# Patient Record
Sex: Female | Born: 1945 | Race: White | Hispanic: No | Smoking: Never smoker
Health system: Southern US, Community
[De-identification: ages and names within clinical notes are randomized; demographics above are authoritative.]

## PROBLEM LIST (undated history)

## (undated) DIAGNOSIS — T7840XA Allergy, unspecified, initial encounter: Secondary | ICD-10-CM

## (undated) DIAGNOSIS — R51 Headache: Secondary | ICD-10-CM

## (undated) DIAGNOSIS — N3281 Overactive bladder: Secondary | ICD-10-CM

## (undated) DIAGNOSIS — M1990 Unspecified osteoarthritis, unspecified site: Secondary | ICD-10-CM

## (undated) DIAGNOSIS — F329 Major depressive disorder, single episode, unspecified: Secondary | ICD-10-CM

## (undated) DIAGNOSIS — H269 Unspecified cataract: Secondary | ICD-10-CM

## (undated) DIAGNOSIS — R519 Headache, unspecified: Secondary | ICD-10-CM

## (undated) DIAGNOSIS — G919 Hydrocephalus, unspecified: Secondary | ICD-10-CM

## (undated) DIAGNOSIS — I10 Essential (primary) hypertension: Secondary | ICD-10-CM

## (undated) DIAGNOSIS — F32A Depression, unspecified: Secondary | ICD-10-CM

## (undated) DIAGNOSIS — K219 Gastro-esophageal reflux disease without esophagitis: Secondary | ICD-10-CM

## (undated) DIAGNOSIS — F419 Anxiety disorder, unspecified: Secondary | ICD-10-CM

## (undated) DIAGNOSIS — E785 Hyperlipidemia, unspecified: Secondary | ICD-10-CM

## (undated) DIAGNOSIS — G319 Degenerative disease of nervous system, unspecified: Secondary | ICD-10-CM

## (undated) DIAGNOSIS — M810 Age-related osteoporosis without current pathological fracture: Secondary | ICD-10-CM

## (undated) DIAGNOSIS — B029 Zoster without complications: Secondary | ICD-10-CM

## (undated) HISTORY — DX: Depression, unspecified: F32.A

## (undated) HISTORY — DX: Zoster without complications: B02.9

## (undated) HISTORY — DX: Anxiety disorder, unspecified: F41.9

## (undated) HISTORY — DX: Gastro-esophageal reflux disease without esophagitis: K21.9

## (undated) HISTORY — DX: Major depressive disorder, single episode, unspecified: F32.9

## (undated) HISTORY — DX: Hyperlipidemia, unspecified: E78.5

## (undated) HISTORY — PX: CHOLECYSTECTOMY: SHX55

## (undated) HISTORY — DX: Essential (primary) hypertension: I10

## (undated) HISTORY — DX: Allergy, unspecified, initial encounter: T78.40XA

## (undated) HISTORY — PX: ABDOMINAL HYSTERECTOMY: SHX81

## (undated) HISTORY — PX: APPENDECTOMY: SHX54

## (undated) HISTORY — DX: Unspecified cataract: H26.9

## (undated) HISTORY — PX: BIOPSY BREAST: PRO8

## (undated) HISTORY — DX: Age-related osteoporosis without current pathological fracture: M81.0

## (undated) HISTORY — PX: NASAL SINUS SURGERY: SHX719

---

## 1999-05-16 ENCOUNTER — Other Ambulatory Visit: Admission: RE | Admit: 1999-05-16 | Discharge: 1999-05-16 | Payer: Self-pay | Admitting: Family Medicine

## 2000-08-26 ENCOUNTER — Ambulatory Visit (HOSPITAL_COMMUNITY): Admission: RE | Admit: 2000-08-26 | Discharge: 2000-08-26 | Payer: Self-pay | Admitting: Internal Medicine

## 2000-08-26 ENCOUNTER — Encounter: Payer: Self-pay | Admitting: Internal Medicine

## 2000-11-09 ENCOUNTER — Emergency Department (HOSPITAL_COMMUNITY): Admission: EM | Admit: 2000-11-09 | Discharge: 2000-11-09 | Payer: Self-pay | Admitting: *Deleted

## 2001-03-20 ENCOUNTER — Encounter: Payer: Self-pay | Admitting: Family Medicine

## 2001-03-20 ENCOUNTER — Ambulatory Visit (HOSPITAL_COMMUNITY): Admission: RE | Admit: 2001-03-20 | Discharge: 2001-03-20 | Payer: Self-pay | Admitting: Family Medicine

## 2001-08-12 ENCOUNTER — Other Ambulatory Visit: Admission: RE | Admit: 2001-08-12 | Discharge: 2001-08-12 | Payer: Self-pay | Admitting: Family Medicine

## 2001-10-30 ENCOUNTER — Ambulatory Visit (HOSPITAL_COMMUNITY): Admission: RE | Admit: 2001-10-30 | Discharge: 2001-10-30 | Payer: Self-pay | Admitting: Internal Medicine

## 2002-01-01 LAB — HM COLONOSCOPY

## 2002-10-27 ENCOUNTER — Other Ambulatory Visit: Admission: RE | Admit: 2002-10-27 | Discharge: 2002-10-27 | Payer: Self-pay | Admitting: Family Medicine

## 2004-12-22 ENCOUNTER — Other Ambulatory Visit: Admission: RE | Admit: 2004-12-22 | Discharge: 2004-12-22 | Payer: Self-pay | Admitting: Family Medicine

## 2010-11-02 LAB — HM DEXA SCAN

## 2012-01-11 ENCOUNTER — Encounter: Payer: Self-pay | Admitting: *Deleted

## 2012-01-11 ENCOUNTER — Encounter: Payer: Self-pay | Admitting: Family Medicine

## 2012-03-18 ENCOUNTER — Telehealth: Payer: Self-pay | Admitting: Nurse Practitioner

## 2012-03-18 NOTE — Telephone Encounter (Signed)
Returning a call about her lab work. Please call

## 2012-04-21 ENCOUNTER — Encounter: Payer: Self-pay | Admitting: *Deleted

## 2012-05-12 NOTE — Telephone Encounter (Addendum)
Labs were on her paper chart this was day we went live. Pt notified of lab results on 03/18/12 per Emmit Pomfret

## 2012-06-04 ENCOUNTER — Other Ambulatory Visit: Payer: Self-pay | Admitting: Family Medicine

## 2012-06-04 ENCOUNTER — Ambulatory Visit: Payer: Self-pay

## 2012-06-04 DIAGNOSIS — M810 Age-related osteoporosis without current pathological fracture: Secondary | ICD-10-CM

## 2012-06-13 ENCOUNTER — Ambulatory Visit (INDEPENDENT_AMBULATORY_CARE_PROVIDER_SITE_OTHER): Payer: Medicare Other | Admitting: Nurse Practitioner

## 2012-06-13 ENCOUNTER — Encounter: Payer: Self-pay | Admitting: Nurse Practitioner

## 2012-06-13 VITALS — BP 134/77 | HR 80 | Temp 98.5°F | Ht 62.0 in | Wt 232.0 lb

## 2012-06-13 DIAGNOSIS — K219 Gastro-esophageal reflux disease without esophagitis: Secondary | ICD-10-CM | POA: Insufficient documentation

## 2012-06-13 DIAGNOSIS — M810 Age-related osteoporosis without current pathological fracture: Secondary | ICD-10-CM | POA: Insufficient documentation

## 2012-06-13 DIAGNOSIS — F32A Depression, unspecified: Secondary | ICD-10-CM | POA: Insufficient documentation

## 2012-06-13 DIAGNOSIS — F329 Major depressive disorder, single episode, unspecified: Secondary | ICD-10-CM

## 2012-06-13 DIAGNOSIS — E785 Hyperlipidemia, unspecified: Secondary | ICD-10-CM | POA: Insufficient documentation

## 2012-06-13 MED ORDER — SERTRALINE HCL 50 MG PO TABS: 50.0000 mg | ORAL_TABLET | Freq: Every day | ORAL | Status: DC

## 2012-06-13 MED ORDER — ATORVASTATIN CALCIUM 10 MG PO TABS: 10.0000 mg | ORAL_TABLET | Freq: Every day | ORAL | Status: DC

## 2012-06-13 MED ORDER — OMEPRAZOLE 40 MG PO CPDR: 40.0000 mg | DELAYED_RELEASE_CAPSULE | Freq: Every day | ORAL | Status: DC

## 2012-06-13 NOTE — Progress Notes (Signed)
  Subjective:    Patient ID: Tracie Wiggins, female    DOB: [birth year], 67 y.o.   MRN: [id number]  Hyperlipidemia This is a chronic problem. The current episode started more than 1 year ago. The problem is controlled. Recent lipid tests were reviewed and are normal. Exacerbating diseases include obesity. There are no known factors aggravating her hyperlipidemia. Pertinent negatives include no focal weakness, myalgias or shortness of breath. Current antihyperlipidemic treatment includes statins. Compliance problems include adherence to diet and adherence to exercise.  Risk factors for coronary artery disease include obesity and post-menopausal.  GERD Omeprazole daily keeps symptoms  sunder control. Patient likes nexium better Depression Working well- helps to keep her relaxed. Osteoporosis prolia- no c/o side effects   Review of Systems  Respiratory: Negative for shortness of breath.   Musculoskeletal: Negative for myalgias.  Neurological: Negative for focal weakness.  All other systems reviewed and are negative.       Objective:   Physical Exam  Constitutional: She is oriented to person, place, and time. She appears well-developed and well-nourished.  HENT:  Nose: Nose normal.  Mouth/Throat: Oropharynx is clear and moist.  Eyes: EOM are normal.  Neck: Trachea normal, normal range of motion and full passive range of motion without pain. Neck supple. No JVD present. Carotid bruit is not present. No thyromegaly present.  Cardiovascular: Normal rate, regular rhythm, normal heart sounds and intact distal pulses.  Exam reveals no gallop and no friction rub.   No murmur heard. Pulmonary/Chest: Effort normal and breath sounds normal.  Abdominal: Soft. Bowel sounds are normal. She exhibits no distension and no mass. There is no tenderness.  Musculoskeletal: Normal range of motion.  Lymphadenopathy:    She has no cervical adenopathy.  Neurological: She is alert and oriented to person,  place, and time. She has normal reflexes.  Skin: Skin is warm and dry.  Psychiatric: She has a normal mood and affect. Her behavior is normal. Judgment and thought content normal.   BP 134/77  Pulse 80  Temp(Src) 98.5 F (36.9 C) (Oral)  Ht 5\' 2"  (1.575 m)  Wt 232 lb (105.235 kg)  BMI 42.42 kg/m2        Assessment & Plan:   1. Hyperlipidemia   2. GERD (gastroesophageal reflux disease)   3. Osteoporosis, unspecified   4. Depression    Orders Placed This Encounter  Procedures  . COMPLETE METABOLIC PANEL WITH GFR  . NMR Lipoprofile with Lipids   Meds ordered this encounter  Medications  . DISCONTD: omeprazole (PRILOSEC) 40 MG capsule    Sig:   . sertraline (ZOLOFT) 50 MG tablet    Sig: Take 1 tablet (50 mg total) by mouth daily.    Dispense:  30 tablet    Refill:  5  . atorvastatin (LIPITOR) 10 MG tablet    Sig: Take 1 tablet (10 mg total) by mouth daily.    Dispense:  30 tablet    Refill:  5  . omeprazole (PRILOSEC) 40 MG capsule    Sig: Take 1 capsule (40 mg total) by mouth daily.    Dispense:  30 capsule    Refill:  5    Order Specific Question:  Supervising Provider    Answer:  Ernestina Penna [[id number]]   Continue all meds labs pending Diet and exercise encouraged F/U in 3 months  Mary-Margaret Daphine Deutscher, FNP

## 2012-06-13 NOTE — Patient Instructions (Signed)

## 2012-06-14 LAB — COMPLETE METABOLIC PANEL WITH GFR
ALT: 21 U/L (ref 0–35)
AST: 21 U/L (ref 0–37)
Albumin: 4.1 g/dL (ref 3.5–5.2)
Alkaline Phosphatase: 92 U/L (ref 39–117)
BUN: 11 mg/dL (ref 6–23)
CO2: 29 mEq/L (ref 19–32)
Calcium: 9.6 mg/dL (ref 8.4–10.5)
Chloride: 103 mEq/L (ref 96–112)
Creat: 0.76 mg/dL (ref 0.50–1.10)
GFR, Est African American: >89 mL/min
GFR, Est Non African American: 81 mL/min
Glucose, Bld: 81 mg/dL (ref 70–99)
Potassium: 4.7 mEq/L (ref 3.5–5.3)
Sodium: 144 mEq/L (ref 135–145)
Total Bilirubin: 0.4 mg/dL (ref 0.3–1.2)
Total Protein: 6.6 g/dL (ref 6.0–8.3)

## 2012-06-16 LAB — NMR LIPOPROFILE WITH LIPIDS
Cholesterol, Total: 160 mg/dL (ref <200)
HDL Particle Number: 39.9 umol/L (ref >=30.5)
HDL Size: 9.3 nm (ref >=9.2)
HDL-C: 62 mg/dL (ref >=40)
LDL (calc): 69 mg/dL (ref <100)
LDL Particle Number: 1154 nmol/L — ABNORMAL HIGH (ref <1000)
LDL Size: 20.7 nm (ref >20.5)
LP-IR Score: 41 (ref <=45)
Large HDL-P: 10.5 umol/L (ref >=4.8)
Large VLDL-P: 3.6 nmol/L — ABNORMAL HIGH (ref <=2.7)
Small LDL Particle Number: 553 nmol/L — ABNORMAL HIGH (ref <=527)
Triglycerides: 147 mg/dL (ref <150)
VLDL Size: 46.4 nm (ref <=46.6)

## 2012-06-27 ENCOUNTER — Other Ambulatory Visit: Payer: Self-pay | Admitting: Nurse Practitioner

## 2012-06-27 ENCOUNTER — Other Ambulatory Visit: Payer: Self-pay | Admitting: Family Medicine

## 2012-07-24 ENCOUNTER — Telehealth: Payer: Self-pay | Admitting: Pharmacist

## 2012-07-28 ENCOUNTER — Telehealth: Payer: Self-pay | Admitting: Nurse Practitioner

## 2012-07-28 NOTE — Telephone Encounter (Signed)
appt made for next prolia injection for 08/28/12 Pt aware

## 2012-07-30 NOTE — Telephone Encounter (Signed)
Patient aware of appt 08/28/12

## 2012-07-30 NOTE — Telephone Encounter (Signed)
Prolia ordered I will let you know when it comes in. 

## 2012-07-31 NOTE — Telephone Encounter (Signed)
Prolia in lab refrigerator 

## 2012-08-28 ENCOUNTER — Ambulatory Visit (INDEPENDENT_AMBULATORY_CARE_PROVIDER_SITE_OTHER): Payer: Medicare Other | Admitting: Pharmacist

## 2012-08-28 DIAGNOSIS — E559 Vitamin D deficiency, unspecified: Secondary | ICD-10-CM

## 2012-08-28 DIAGNOSIS — M810 Age-related osteoporosis without current pathological fracture: Secondary | ICD-10-CM

## 2012-08-28 MED ORDER — DENOSUMAB 60 MG/ML ~~LOC~~ SOLN
60.0000 mg | Freq: Once | SUBCUTANEOUS | Status: AC
  Administered 2012-08-28: 60 mg via SUBCUTANEOUS

## 2012-08-28 NOTE — Progress Notes (Signed)
[facility]   HPI: Patient is osteoporosis due Prolia Injection. Last was 02/2012  Prior fracture?  No Med(s) for Osteoporosis/Osteopenia:  Prolia 60mg  SQ q6 months Med(s) previously tried for Osteoporosis/Osteopenia:  actonel - weakness and evista - stopped because HRT started                                                             PMH:  Hysterectomy?  Yes Oophorectomy?  No Last Vitamin D Result:  28 (03/2012) Last GFR Result:  81 (06/2012)   FH/SH: Family history of osteoporosis?  No Parent with history of hip fracture?  No Family history of breast cancer?  No Exercise?  No Smoking?  No Alcohol?  No      DEXA Results Date of Test T-Score for AP Spine L1-L4 T-Score for Total Left Hip T-Score for Total Right Hip  11/15/2010 -1.4 -2.5 -2.0  09/16/2006 -1.6 -2.3 -2.3  11/23/2002 -1.5 -1.5 --  08/06/2000 -1.8 -1.6 --   Assessment: osteoporosis  Recommendations: 1.  Contineu prolia 60mg  SQ q 6 months - injection given in office today 2.  recommend calcium 1200mg  daily through supplementation or diet.  3.  recommend weight bearing exercise - 30 minutes at least 4 days  per week.   4.  Counseled and educated about fall risk and prevention.  Recheck DEXA:  due 11/2012 - order sent for Dexa  Time spent counseling patient:  20 minutes  Henrene Pastor, PharmD, CPP

## 2012-09-15 ENCOUNTER — Encounter: Payer: Self-pay | Admitting: Nurse Practitioner

## 2012-09-15 ENCOUNTER — Ambulatory Visit (INDEPENDENT_AMBULATORY_CARE_PROVIDER_SITE_OTHER): Payer: Medicare Other | Admitting: Nurse Practitioner

## 2012-09-15 VITALS — BP 137/86 | HR 73 | Temp 97.4°F | Ht 62.0 in | Wt 233.0 lb

## 2012-09-15 DIAGNOSIS — K219 Gastro-esophageal reflux disease without esophagitis: Secondary | ICD-10-CM

## 2012-09-15 DIAGNOSIS — F32A Depression, unspecified: Secondary | ICD-10-CM

## 2012-09-15 DIAGNOSIS — F329 Major depressive disorder, single episode, unspecified: Secondary | ICD-10-CM

## 2012-09-15 DIAGNOSIS — E785 Hyperlipidemia, unspecified: Secondary | ICD-10-CM

## 2012-09-15 DIAGNOSIS — M810 Age-related osteoporosis without current pathological fracture: Secondary | ICD-10-CM

## 2012-09-15 DIAGNOSIS — E559 Vitamin D deficiency, unspecified: Secondary | ICD-10-CM

## 2012-09-15 MED ORDER — SERTRALINE HCL 50 MG PO TABS: 50.0000 mg | ORAL_TABLET | Freq: Every day | ORAL | Status: DC

## 2012-09-15 MED ORDER — LORAZEPAM 0.5 MG PO TABS: 0.5000 mg | ORAL_TABLET | Freq: Three times a day (TID) | ORAL | Status: DC

## 2012-09-15 MED ORDER — OMEPRAZOLE 40 MG PO CPDR: 40.0000 mg | DELAYED_RELEASE_CAPSULE | Freq: Every day | ORAL | Status: DC

## 2012-09-15 MED ORDER — ATORVASTATIN CALCIUM 10 MG PO TABS: 10.0000 mg | ORAL_TABLET | Freq: Every day | ORAL | Status: DC

## 2012-09-15 NOTE — Patient Instructions (Signed)

## 2012-09-15 NOTE — Progress Notes (Signed)
Subjective:    Patient ID: Tracie Wiggins, female    DOB: October 12, 1945, 67 y.o.   MRN: [id number]  Hyperlipidemia This is a chronic problem. The current episode started more than 1 year ago. The problem is controlled. Recent lipid tests were reviewed and are normal. Exacerbating diseases include obesity. There are no known factors aggravating her hyperlipidemia. Pertinent negatives include no focal weakness, myalgias or shortness of breath. Current antihyperlipidemic treatment includes statins. Compliance problems include adherence to diet and adherence to exercise.  Risk factors for coronary artery disease include obesity and post-menopausal.  GERD Omeprazole daily keeps symptoms  sunder control. Patient likes nexium better Depression Working well- helps to keep her relaxed. Osteoporosis prolia- no c/o side effects  * Patient c/o right ear stopped up- has been that way for several weeks  Review of Systems  Respiratory: Negative for shortness of breath.   Musculoskeletal: Negative for myalgias.  Neurological: Negative for focal weakness.  All other systems reviewed and are negative.       Objective:   Physical Exam  Constitutional: She is oriented to person, place, and time. She appears well-developed and well-nourished.  HENT:  Nose: Nose normal.  Mouth/Throat: Oropharynx is clear and moist.  Eyes: EOM are normal.  Neck: Trachea normal, normal range of motion and full passive range of motion without pain. Neck supple. No JVD present. Carotid bruit is not present. No thyromegaly present.  Cardiovascular: Normal rate, regular rhythm, normal heart sounds and intact distal pulses.  Exam reveals no gallop and no friction rub.   No murmur heard. Pulmonary/Chest: Effort normal and breath sounds normal.  Abdominal: Soft. Bowel sounds are normal. She exhibits no distension and no mass. There is no tenderness.  Musculoskeletal: Normal range of motion.  Lymphadenopathy:    She has no  cervical adenopathy.  Neurological: She is alert and oriented to person, place, and time. She has normal reflexes.  Skin: Skin is warm and dry.  Psychiatric: She has a normal mood and affect. Her behavior is normal. Judgment and thought content normal.   BP 137/86  Pulse 73  Temp(Src) 97.4 F (36.3 C) (Oral)  Ht 5\' 2"  (1.575 m)  Wt 233 lb (105.688 kg)  BMI 42.61 kg/m2        Assessment & Plan:   1. Osteoporosis, unspecified   2. Vitamin D insufficiency   3. Hyperlipidemia   4. GERD (gastroesophageal reflux disease)   5. Depression    Orders Placed This Encounter  Procedures  . CMP14+EGFR  . NMR, lipoprofile     Medication List       This list is accurate as of: 09/15/12 12:06 PM.  Always use your most recent med list.               atorvastatin 10 MG tablet  Commonly known as:  LIPITOR  Take 1 tablet (10 mg total) by mouth daily.     denosumab 60 MG/ML Soln injection  Commonly known as:  PROLIA  Inject 60 mg into the skin every 6 (six) months. Administer in upper arm, thigh, or abdomen     LORazepam 0.5 MG tablet  Commonly known as:  ATIVAN  Take 1 tablet (0.5 mg total) by mouth every 8 (eight) hours.     meclizine 25 MG tablet  Commonly known as:  ANTIVERT  Take 25 mg by mouth 4 (four) times daily as needed.     omeprazole 40 MG capsule  Commonly known as:  PRILOSEC  Take 1 capsule (40 mg total) by mouth daily.     sertraline 50 MG tablet  Commonly known as:  ZOLOFT  Take 1 tablet (50 mg total) by mouth daily.        Continue all meds labs pending Diet and exercise encouraged F/U in 3 months Health maintenance reviewed  Mary-Margaret Daphine Deutscher, FNP

## 2012-09-15 NOTE — Addendum Note (Signed)
Addended by: Bennie Pierini on: 09/15/2012 12:41 PM   Modules accepted: Orders

## 2012-09-17 ENCOUNTER — Telehealth: Payer: Self-pay | Admitting: Nurse Practitioner

## 2012-09-17 LAB — CMP14+EGFR
ALT: 21 IU/L (ref 0–32)
AST: 20 IU/L (ref 0–40)
Albumin/Globulin Ratio: 2.3 (ref 1.1–2.5)
Albumin: 4.5 g/dL (ref 3.6–4.8)
Alkaline Phosphatase: 97 IU/L (ref 39–117)
BUN/Creatinine Ratio: 10 — ABNORMAL LOW (ref 11–26)
BUN: 8 mg/dL (ref 8–27)
CO2: 28 mmol/L (ref 18–29)
Calcium: 9.3 mg/dL (ref 8.6–10.2)
Chloride: 99 mmol/L (ref 97–108)
Creatinine, Ser: 0.84 mg/dL (ref 0.57–1.00)
GFR calc Af Amer: 83 mL/min/{1.73_m2} (ref >59)
GFR calc non Af Amer: 72 mL/min/{1.73_m2} (ref >59)
Globulin, Total: 2 g/dL (ref 1.5–4.5)
Glucose: 89 mg/dL (ref 65–99)
Potassium: 4.8 mmol/L (ref 3.5–5.2)
Sodium: 141 mmol/L (ref 134–144)
Total Bilirubin: 0.4 mg/dL (ref 0.0–1.2)
Total Protein: 6.5 g/dL (ref 6.0–8.5)

## 2012-09-17 LAB — VITAMIN D 25 HYDROXY (VIT D DEFICIENCY, FRACTURES): Vit D, 25-Hydroxy: 40.9 ng/mL (ref 30.0–100.0)

## 2012-09-17 LAB — NMR, LIPOPROFILE
Cholesterol: 173 mg/dL (ref <200)
HDL Cholesterol by NMR: 65 mg/dL (ref >=40)
HDL Particle Number: 38.9 umol/L (ref >=30.5)
LDL Particle Number: 1237 nmol/L — ABNORMAL HIGH (ref <1000)
LDL Size: 21 nm (ref >20.5)
LDLC SERPL CALC-MCNC: 75 mg/dL (ref <100)
LP-IR Score: 46 — ABNORMAL HIGH (ref <=45)
Small LDL Particle Number: 519 nmol/L (ref <=527)
Triglycerides by NMR: 167 mg/dL — ABNORMAL HIGH (ref <150)

## 2012-09-17 NOTE — Telephone Encounter (Signed)
Patient aware.

## 2012-10-13 ENCOUNTER — Ambulatory Visit (INDEPENDENT_AMBULATORY_CARE_PROVIDER_SITE_OTHER): Payer: Medicare Other

## 2012-10-13 DIAGNOSIS — Z23 Encounter for immunization: Secondary | ICD-10-CM

## 2012-11-19 ENCOUNTER — Ambulatory Visit (INDEPENDENT_AMBULATORY_CARE_PROVIDER_SITE_OTHER): Payer: Medicare Other

## 2012-11-19 ENCOUNTER — Ambulatory Visit (INDEPENDENT_AMBULATORY_CARE_PROVIDER_SITE_OTHER): Payer: Medicare Other | Admitting: Pharmacist

## 2012-11-19 VITALS — Ht 62.0 in | Wt 234.0 lb

## 2012-11-19 DIAGNOSIS — M810 Age-related osteoporosis without current pathological fracture: Secondary | ICD-10-CM

## 2012-11-19 NOTE — Progress Notes (Signed)
Patient ID: MAKALIA BARE, female   DOB: 08-19-1945, 67 y.o.   MRN: [id number]  [facility]osis [facility] Current Height: Height: 5\' 2"  (157.5 cm)      Max Lifetime Height:  5\' 3"  Current Weight: Weight: 234 lb (106.142 kg)       Ethnicity:Caucasian   HPI: Mrs. Sherry has osteoporosis diagnosed in 2012.  She has been injecting Prolia 60mg  SQ every 6 months since her first injection 08/27/2011.  Her last Prolia injection was 08/2012.  Next injection due 02/2013.   Back Pain?  No       Kyphosis?  No Prior fracture?  Yes - femur                                                             PMH: Age at menopause:  67 yo Hysterectomy?  Yes Oophorectomy?  Yes HRT? Yes - Former.  Type/duration: premarin Steroid Use?  No Thyroid med?  No History of cancer?  No History of digestive disorders (ie Crohn's)?  Yes - GERD Current or previous eating disorders?  No Last Vitamin D Result:  40.9 (09/15/2012) Last GFR Result:  83 (09/15/2012)   FH/SH: Family history of osteoporosis?  No Parent with history of hip fracture?  No Family history of breast cancer?  No Exercise?  Yes - walking some but not much Smoking?  No Alcohol?  No    Calcium Assessment Calcium Intake  # of servings/day  Calcium mg  Milk (8 oz) 1  x  300  = 300mg   Yogurt (4 oz) 0 x  200 = 0  Cheese (1 oz) 0 x  200 = 0  Other Calcium sources   250mg   Ca supplement 500mg  bid = 1000mg    Estimated calcium intake per day 1550mg     DEXA Results Date of Test T-Score for AP Spine L1-L4 T-Score for Total Left Hip T-Score for Total Right Hip  11/19/2012 -0.6 -1.3 -1.4  11/15/2010 -1.4 -1.6 -1.8  09/16/2006 -1.6 -1.5 -1.8  11/23/1999 -1.5 -1.2 -1.8  ** T-Score for left neck of femur 11/15/2010 was -2.5**  Assessment: Osteoporosis with increase in BMD since starting Prolia  Recommendations: 1.  Continue Prolia 60mg  SQ q 6 months - next due 02/2013 2.  continue calcium 1200mg  daily through supplementation or diet.  3.   recommend weight bearing exercise - 30 minutes at least 4 days  per week.   4.  Counseled and educated about fall risk and prevention.  Recheck DEXA:  2 years  Time spent counseling patient:  20 minutes  Henrene Pastor, PharmD, CPP

## 2012-12-22 ENCOUNTER — Ambulatory Visit (INDEPENDENT_AMBULATORY_CARE_PROVIDER_SITE_OTHER): Payer: Medicare Other | Admitting: Nurse Practitioner

## 2012-12-22 ENCOUNTER — Encounter: Payer: Self-pay | Admitting: Nurse Practitioner

## 2012-12-22 ENCOUNTER — Encounter (INDEPENDENT_AMBULATORY_CARE_PROVIDER_SITE_OTHER): Payer: Self-pay

## 2012-12-22 VITALS — BP 153/85 | HR 82 | Temp 97.1°F | Ht 62.0 in | Wt 234.0 lb

## 2012-12-22 DIAGNOSIS — F329 Major depressive disorder, single episode, unspecified: Secondary | ICD-10-CM

## 2012-12-22 DIAGNOSIS — F32A Depression, unspecified: Secondary | ICD-10-CM

## 2012-12-22 DIAGNOSIS — F3289 Other specified depressive episodes: Secondary | ICD-10-CM

## 2012-12-22 DIAGNOSIS — E785 Hyperlipidemia, unspecified: Secondary | ICD-10-CM

## 2012-12-22 DIAGNOSIS — I10 Essential (primary) hypertension: Secondary | ICD-10-CM | POA: Insufficient documentation

## 2012-12-22 DIAGNOSIS — K219 Gastro-esophageal reflux disease without esophagitis: Secondary | ICD-10-CM

## 2012-12-22 MED ORDER — LORAZEPAM 0.5 MG PO TABS: 0.5000 mg | ORAL_TABLET | Freq: Three times a day (TID) | ORAL | Status: DC

## 2012-12-22 MED ORDER — ATORVASTATIN CALCIUM 10 MG PO TABS: 10.0000 mg | ORAL_TABLET | Freq: Every day | ORAL | Status: DC

## 2012-12-22 MED ORDER — HYDROCHLOROTHIAZIDE 12.5 MG PO CAPS: 12.5000 mg | ORAL_CAPSULE | Freq: Every day | ORAL | Status: DC

## 2012-12-22 MED ORDER — OMEPRAZOLE 40 MG PO CPDR: 40.0000 mg | DELAYED_RELEASE_CAPSULE | Freq: Every day | ORAL | Status: DC

## 2012-12-22 NOTE — Patient Instructions (Signed)

## 2012-12-22 NOTE — Progress Notes (Signed)
Subjective:    Patient ID: Tracie Wiggins, female    DOB: 1945-01-30, 67 y.o.   MRN: [id number]  Hyperlipidemia This is a chronic problem. The current episode started more than 1 year ago. The problem is controlled. Recent lipid tests were reviewed and are normal. Exacerbating diseases include obesity. There are no known factors aggravating her hyperlipidemia. Pertinent negatives include no focal weakness or myalgias. Current antihyperlipidemic treatment includes statins. Compliance problems include adherence to diet and adherence to exercise.  Risk factors for coronary artery disease include obesity and post-menopausal.  GERD Omeprazole daily keeps symptoms  sunder control. Patient likes nexium better Depression Working well- helps to keep her relaxed. Osteoporosis prolia- no c/o side effects  * patient says that she has had a runny nose and post nasal for about 1 week. Review of Systems  Constitutional: Negative for fever and chills.  HENT: Positive for congestion, postnasal drip and sinus pressure.   Eyes: Negative.   Respiratory: Negative.   Cardiovascular: Negative.   Gastrointestinal: Negative.   Genitourinary: Negative.   Musculoskeletal: Negative.  Negative for myalgias.  Neurological: Negative for focal weakness.  Psychiatric/Behavioral: Negative.   All other systems reviewed and are negative.       Objective:   Physical Exam  Constitutional: She is oriented to person, place, and time. She appears well-developed and well-nourished.  HENT:  Right Ear: Hearing, tympanic membrane, external ear and ear canal normal.  Left Ear: Hearing, tympanic membrane, external ear and ear canal normal.  Nose: Nose normal. Right sinus exhibits no maxillary sinus tenderness and no frontal sinus tenderness. Left sinus exhibits no maxillary sinus tenderness and no frontal sinus tenderness.  Mouth/Throat: Uvula is midline, oropharynx is clear and moist and mucous membranes are normal.   Eyes: EOM are normal.  Neck: Trachea normal, normal range of motion and full passive range of motion without pain. Neck supple. No JVD present. Carotid bruit is not present. No thyromegaly present.  Cardiovascular: Normal rate, regular rhythm, normal heart sounds and intact distal pulses.  Exam reveals no gallop and no friction rub.   No murmur heard. Pulmonary/Chest: Effort normal and breath sounds normal.  Abdominal: Soft. Bowel sounds are normal. She exhibits no distension and no mass. There is no tenderness.  Musculoskeletal: Normal range of motion.  Lymphadenopathy:    She has no cervical adenopathy.  Neurological: She is alert and oriented to person, place, and time. She has normal reflexes.  Skin: Skin is warm and dry.  Psychiatric: She has a normal mood and affect. Her behavior is normal. Judgment and thought content normal.   BP 153/85  Pulse 82  Temp(Src) 97.1 F (36.2 C) (Oral)  Ht 5\' 2"  (1.575 m)  Wt 234 lb (106.142 kg)  BMI 42.79 kg/m2        Assessment & Plan:   1. Depression   2. GERD (gastroesophageal reflux disease)   3. Hyperlipidemia   4. Hypertension    Orders Placed This Encounter  Procedures  . CMP14+EGFR  . NMR, lipoprofile   Meds ordered this encounter  Medications  . atorvastatin (LIPITOR) 10 MG tablet    Sig: Take 1 tablet (10 mg total) by mouth daily.    Dispense:  30 tablet    Refill:  5    Order Specific Question:  Supervising Provider    Answer:  Ernestina Penna [[id number]]  . LORazepam (ATIVAN) 0.5 MG tablet    Sig: Take 1 tablet (0.5 mg total) by mouth every 8 (  eight) hours.    Dispense:  30 tablet    Refill:  1    Order Specific Question:  Supervising Provider    Answer:  Ernestina Penna [[id number]]  . omeprazole (PRILOSEC) 40 MG capsule    Sig: Take 1 capsule (40 mg total) by mouth daily.    Dispense:  30 capsule    Refill:  5    Order Specific Question:  Supervising Provider    Answer:  Ernestina Penna [[id number]]  . hydrochlorothiazide  (MICROZIDE) 12.5 MG capsule    Sig: Take 1 capsule (12.5 mg total) by mouth daily.    Dispense:  30 capsule    Refill:  5    Order Specific Question:  Supervising Provider    Answer:  Christell Constant, DONALD W [[id number]]   Started on HCTZ for HTN Continue all meds Labs pending Diet and exercise encouraged Health maintenance reviewed Follow up in 3 months Hemoccult cards Shingles vaccine today  Mary-Margaret Daphine Deutscher, FNP

## 2012-12-23 LAB — NMR, LIPOPROFILE
Cholesterol: 164 mg/dL (ref <200)
HDL Cholesterol by NMR: 59 mg/dL (ref >=40)
HDL Particle Number: 33.8 umol/L (ref >=30.5)
LDL Particle Number: 1114 nmol/L — ABNORMAL HIGH (ref <1000)
LDL Size: 21 nm (ref >20.5)
LDLC SERPL CALC-MCNC: 77 mg/dL (ref <100)
LP-IR Score: 50 — ABNORMAL HIGH (ref <=45)
Small LDL Particle Number: 407 nmol/L (ref <=527)
Triglycerides by NMR: 142 mg/dL (ref <150)

## 2012-12-23 LAB — CMP14+EGFR
ALT: 21 IU/L (ref 0–32)
AST: 19 IU/L (ref 0–40)
Albumin/Globulin Ratio: 1.9 (ref 1.1–2.5)
Albumin: 4.1 g/dL (ref 3.6–4.8)
Alkaline Phosphatase: 96 IU/L (ref 39–117)
BUN/Creatinine Ratio: 11 (ref 11–26)
BUN: 10 mg/dL (ref 8–27)
CO2: 26 mmol/L (ref 18–29)
Calcium: 9.1 mg/dL (ref 8.6–10.2)
Chloride: 102 mmol/L (ref 97–108)
Creatinine, Ser: 0.88 mg/dL (ref 0.57–1.00)
GFR calc Af Amer: 78 mL/min/{1.73_m2} (ref >59)
GFR calc non Af Amer: 68 mL/min/{1.73_m2} (ref >59)
Globulin, Total: 2.2 g/dL (ref 1.5–4.5)
Glucose: 93 mg/dL (ref 65–99)
Potassium: 4.7 mmol/L (ref 3.5–5.2)
Sodium: 142 mmol/L (ref 134–144)
Total Bilirubin: 0.4 mg/dL (ref 0.0–1.2)
Total Protein: 6.3 g/dL (ref 6.0–8.5)

## 2013-02-23 ENCOUNTER — Telehealth: Payer: Self-pay | Admitting: Pharmacist

## 2013-02-23 NOTE — Telephone Encounter (Signed)
Prior auth for prolia approved - confirmation number is [id number].  Approved from 02/20/2013 through 02/20/2014.

## 2013-02-25 NOTE — Telephone Encounter (Signed)
Left message that appt made for 03/02/13 at 11:50am.  Patient to call back if this does not work for her.

## 2013-03-02 ENCOUNTER — Ambulatory Visit (INDEPENDENT_AMBULATORY_CARE_PROVIDER_SITE_OTHER): Payer: Medicare Other | Admitting: Pharmacist

## 2013-03-02 DIAGNOSIS — M810 Age-related osteoporosis without current pathological fracture: Secondary | ICD-10-CM

## 2013-03-02 MED ORDER — DENOSUMAB 60 MG/ML ~~LOC~~ SOLN
60.0000 mg | Freq: Once | SUBCUTANEOUS | Status: AC
  Administered 2013-03-02: 60 mg via SUBCUTANEOUS

## 2013-03-02 NOTE — Progress Notes (Signed)
Patient ID: Tracie Wiggins, female   DOB: 08-23-1945, 68 y.o.   MRN: [id number]  [facility]steoporosis Clinic - here for prolia injection  HPI: Tracie Wiggins has osteoporosis diagnosed in 2012.  She has been injecting Prolia 60mg  SQ every 6 months since her first injection 08/27/2011.  Her last Prolia injection was 08/2012.  Next injection due today   Back Pain?  No       Kyphosis?  No Prior fracture?  Yes - femur                                                             PMH: Age at menopause:  68 yo Hysterectomy?  Yes Oophorectomy?  Yes HRT? Yes - Former.  Type/duration: premarin Steroid Use?  No Thyroid med?  No History of cancer?  No History of digestive disorders (ie Crohn's)?  Yes - GERD Current or previous eating disorders?  No Last Vitamin D Result:  40.9 (09/15/2012) Last GFR Result:  68 (12/22/12)   FH/SH: Family history of osteoporosis?  No Parent with history of hip fracture?  No Family history of breast cancer?  No Exercise?  Yes - walking some but not much Smoking?  No Alcohol?  No    Calcium Assessment Calcium Intake  # of servings/day  Calcium mg  Milk (8 oz) 1  x  300  = 300mg   Yogurt (4 oz) 0 x  200 = 0  Cheese (1 oz) 0 x  200 = 0  Other Calcium sources   250mg   Ca supplement 500mg  bid = 1000mg    Estimated calcium intake per day 1550mg     DEXA Results Date of Test T-Score for AP Spine L1-L4 T-Score for Total Left Hip T-Score for Total Right Hip  11/19/2012 -0.6 -1.3 -1.4  11/15/2010 -1.4 -1.6 -1.8  09/16/2006 -1.6 -1.5 -1.8  11/23/1999 -1.5 -1.2 -1.8  ** T-Score for left neck of femur 11/15/2010 was -2.5**  Assessment: Osteoporosis with increase in BMD since starting Prolia  Recommendations: 1.  Continue Prolia 60mg  SQ q 6 months - injection given today.  Next will be due 09/2013. 2.  continue calcium 1200mg  daily through supplementation or diet.  3.  recommend weight bearing exercise - 30 minutes at least 4 days per week.   4.  Counseled and  educated about fall risk and prevention.  Recheck DEXA:  11/2014  Time spent counseling patient:  20 minutes  Cherre Robins, PharmD, CPP

## 2013-03-23 ENCOUNTER — Ambulatory Visit (INDEPENDENT_AMBULATORY_CARE_PROVIDER_SITE_OTHER): Payer: Medicare Other | Admitting: Nurse Practitioner

## 2013-03-23 ENCOUNTER — Encounter: Payer: Self-pay | Admitting: Nurse Practitioner

## 2013-03-23 VITALS — BP 122/82 | HR 79 | Temp 97.1°F | Ht 62.0 in | Wt 231.0 lb

## 2013-03-23 DIAGNOSIS — I10 Essential (primary) hypertension: Secondary | ICD-10-CM

## 2013-03-23 DIAGNOSIS — F3289 Other specified depressive episodes: Secondary | ICD-10-CM

## 2013-03-23 DIAGNOSIS — F32A Depression, unspecified: Secondary | ICD-10-CM

## 2013-03-23 DIAGNOSIS — K219 Gastro-esophageal reflux disease without esophagitis: Secondary | ICD-10-CM

## 2013-03-23 DIAGNOSIS — F329 Major depressive disorder, single episode, unspecified: Secondary | ICD-10-CM

## 2013-03-23 DIAGNOSIS — E785 Hyperlipidemia, unspecified: Secondary | ICD-10-CM

## 2013-03-23 MED ORDER — ATORVASTATIN CALCIUM 10 MG PO TABS: 10.0000 mg | ORAL_TABLET | Freq: Every day | ORAL | Status: DC

## 2013-03-23 MED ORDER — HYDROCHLOROTHIAZIDE 12.5 MG PO CAPS: 12.5000 mg | ORAL_CAPSULE | Freq: Every day | ORAL | Status: DC

## 2013-03-23 MED ORDER — OMEPRAZOLE 40 MG PO CPDR: 40.0000 mg | DELAYED_RELEASE_CAPSULE | Freq: Every day | ORAL | Status: DC

## 2013-03-23 MED ORDER — SERTRALINE HCL 50 MG PO TABS: 50.0000 mg | ORAL_TABLET | Freq: Every day | ORAL | Status: DC

## 2013-03-23 MED ORDER — LORAZEPAM 0.5 MG PO TABS: 0.5000 mg | ORAL_TABLET | Freq: Three times a day (TID) | ORAL | Status: DC

## 2013-03-23 NOTE — Progress Notes (Signed)
Subjective:    Patient ID: Tracie Wiggins, female    DOB: 1945-09-19, 68 y.o.   MRN: [id number]  Pateint here today for follow up of chronic medical problems- SH e has no complaints today.  Hyperlipidemia This is a chronic problem. The current episode started more than 1 year ago. The problem is controlled. Recent lipid tests were reviewed and are normal. Exacerbating diseases include obesity. There are no known factors aggravating her hyperlipidemia. Pertinent negatives include no focal weakness or myalgias. Current antihyperlipidemic treatment includes statins. Compliance problems include adherence to diet and adherence to exercise.  Risk factors for coronary artery disease include obesity and post-menopausal.  GERD Omeprazole daily keeps symptoms  sunder control. Patient likes nexium better Depression Working well- helps to keep her relaxed. Osteoporosis prolia- no c/o side effects  Review of Systems  Constitutional: Negative for fever and chills.  HENT: Positive for congestion, postnasal drip and sinus pressure.   Eyes: Negative.   Respiratory: Negative.   Cardiovascular: Negative.   Gastrointestinal: Negative.   Genitourinary: Negative.   Musculoskeletal: Negative.  Negative for myalgias.  Neurological: Negative for focal weakness.  Psychiatric/Behavioral: Negative.   All other systems reviewed and are negative.       Objective:   Physical Exam  Constitutional: She is oriented to person, place, and time. She appears well-developed and well-nourished.  HENT:  Right Ear: Hearing, tympanic membrane, external ear and ear canal normal.  Left Ear: Hearing, tympanic membrane, external ear and ear canal normal.  Nose: Nose normal. Right sinus exhibits no maxillary sinus tenderness and no frontal sinus tenderness. Left sinus exhibits no maxillary sinus tenderness and no frontal sinus tenderness.  Mouth/Throat: Uvula is midline, oropharynx is clear and moist and mucous membranes  are normal.  Eyes: EOM are normal.  Neck: Trachea normal, normal range of motion and full passive range of motion without pain. Neck supple. No JVD present. Carotid bruit is not present. No thyromegaly present.  Cardiovascular: Normal rate, regular rhythm, normal heart sounds and intact distal pulses.  Exam reveals no gallop and no friction rub.   No murmur heard. Pulmonary/Chest: Effort normal and breath sounds normal.  Abdominal: Soft. Bowel sounds are normal. She exhibits no distension and no mass. There is no tenderness.  Musculoskeletal: Normal range of motion.  Lymphadenopathy:    She has no cervical adenopathy.  Neurological: She is alert and oriented to person, place, and time. She has normal reflexes.  Skin: Skin is warm and dry.  Psychiatric: She has a normal mood and affect. Her behavior is normal. Judgment and thought content normal.   BP 122/82  Pulse 79  Temp(Src) 97.1 F (36.2 C) (Oral)  Ht '5\' 2"'  (1.575 m)  Wt 231 lb (104.781 kg)  BMI 42.24 kg/m2        Assessment & Plan:   1. Hypertension   2. Hyperlipidemia   3. GERD (gastroesophageal reflux disease)   4. Depression    Orders Placed This Encounter  Procedures  . CMP14+EGFR  . NMR, lipoprofile   Meds ordered this encounter  Medications  . LORazepam (ATIVAN) 0.5 MG tablet    Sig: Take 1 tablet (0.5 mg total) by mouth every 8 (eight) hours.    Dispense:  30 tablet    Refill:  1    Order Specific Question:  Supervising Provider    Answer:  Chipper Herb [[id number]]  . sertraline (ZOLOFT) 50 MG tablet    Sig: Take 1 tablet (50 mg total) by  mouth daily.    Dispense:  30 tablet    Refill:  5    Order Specific Question:  Supervising Provider    Answer:  Chipper Herb [[id number]]  . omeprazole (PRILOSEC) 40 MG capsule    Sig: Take 1 capsule (40 mg total) by mouth daily.    Dispense:  30 capsule    Refill:  5    Order Specific Question:  Supervising Provider    Answer:  Chipper Herb [[id number]]  . atorvastatin  (LIPITOR) 10 MG tablet    Sig: Take 1 tablet (10 mg total) by mouth daily.    Dispense:  30 tablet    Refill:  5    Order Specific Question:  Supervising Provider    Answer:  Chipper Herb [[id number]]  . hydrochlorothiazide (MICROZIDE) 12.5 MG capsule    Sig: Take 1 capsule (12.5 mg total) by mouth daily.    Dispense:  30 capsule    Refill:  5    Order Specific Question:  Supervising Provider    Answer:  Chipper Herb [place] pending Health maintenance reviewed Diet and exercise encouraged Continue all meds Follow up  In 3 months   [place], FNP

## 2013-03-23 NOTE — Patient Instructions (Signed)

## 2013-03-25 LAB — NMR, LIPOPROFILE
Cholesterol: 164 mg/dL (ref <200)
HDL Cholesterol by NMR: 63 mg/dL (ref >=40)
HDL Particle Number: 35.5 umol/L (ref >=30.5)
LDL Particle Number: 1139 nmol/L — ABNORMAL HIGH (ref <1000)
LDL Size: 21.1 nm (ref >20.5)
LDLC SERPL CALC-MCNC: 74 mg/dL (ref <100)
LP-IR Score: 43 (ref <=45)
Small LDL Particle Number: 370 nmol/L (ref <=527)
Triglycerides by NMR: 137 mg/dL (ref <150)

## 2013-03-25 LAB — CMP14+EGFR
ALT: 25 IU/L (ref 0–32)
AST: 22 IU/L (ref 0–40)
Albumin/Globulin Ratio: 2.4 (ref 1.1–2.5)
Albumin: 4.4 g/dL (ref 3.6–4.8)
Alkaline Phosphatase: 82 IU/L (ref 39–117)
BUN/Creatinine Ratio: 12 (ref 11–26)
BUN: 11 mg/dL (ref 8–27)
CO2: 24 mmol/L (ref 18–29)
Calcium: 9.2 mg/dL (ref 8.7–10.3)
Chloride: 101 mmol/L (ref 97–108)
Creatinine, Ser: 0.94 mg/dL (ref 0.57–1.00)
GFR calc Af Amer: 72 mL/min/{1.73_m2} (ref >59)
GFR calc non Af Amer: 62 mL/min/{1.73_m2} (ref >59)
Globulin, Total: 1.8 g/dL (ref 1.5–4.5)
Glucose: 102 mg/dL — ABNORMAL HIGH (ref 65–99)
Potassium: 4.2 mmol/L (ref 3.5–5.2)
Sodium: 142 mmol/L (ref 134–144)
Total Bilirubin: 0.4 mg/dL (ref 0.0–1.2)
Total Protein: 6.2 g/dL (ref 6.0–8.5)

## 2013-06-29 ENCOUNTER — Encounter: Payer: Self-pay | Admitting: Nurse Practitioner

## 2013-06-29 ENCOUNTER — Ambulatory Visit (INDEPENDENT_AMBULATORY_CARE_PROVIDER_SITE_OTHER): Payer: Medicare Other | Admitting: Nurse Practitioner

## 2013-06-29 VITALS — BP 144/94 | HR 84 | Temp 98.3°F | Ht 62.0 in | Wt 242.4 lb

## 2013-06-29 DIAGNOSIS — Z6841 Body Mass Index (BMI) 40.0 and over, adult: Secondary | ICD-10-CM

## 2013-06-29 DIAGNOSIS — F32A Depression, unspecified: Secondary | ICD-10-CM

## 2013-06-29 DIAGNOSIS — K219 Gastro-esophageal reflux disease without esophagitis: Secondary | ICD-10-CM

## 2013-06-29 DIAGNOSIS — R609 Edema, unspecified: Secondary | ICD-10-CM

## 2013-06-29 DIAGNOSIS — N3945 Continuous leakage: Secondary | ICD-10-CM

## 2013-06-29 DIAGNOSIS — I10 Essential (primary) hypertension: Secondary | ICD-10-CM

## 2013-06-29 DIAGNOSIS — M810 Age-related osteoporosis without current pathological fracture: Secondary | ICD-10-CM

## 2013-06-29 DIAGNOSIS — F329 Major depressive disorder, single episode, unspecified: Secondary | ICD-10-CM

## 2013-06-29 DIAGNOSIS — F3289 Other specified depressive episodes: Secondary | ICD-10-CM

## 2013-06-29 DIAGNOSIS — R600 Localized edema: Secondary | ICD-10-CM

## 2013-06-29 DIAGNOSIS — Z713 Dietary counseling and surveillance: Secondary | ICD-10-CM

## 2013-06-29 DIAGNOSIS — E785 Hyperlipidemia, unspecified: Secondary | ICD-10-CM

## 2013-06-29 MED ORDER — DEPEND SILHOUETTE BRIEFS L/XL MISC: 1.0000 | Freq: Three times a day (TID) | Status: DC

## 2013-06-29 MED ORDER — FUROSEMIDE 20 MG PO TABS: ORAL_TABLET | ORAL | Status: DC

## 2013-06-29 MED ORDER — POISE HOURGLASS SHAPE PADS: 1.0000 | MEDICATED_PAD | Freq: Three times a day (TID) | Status: DC

## 2013-06-29 MED ORDER — LISINOPRIL 10 MG PO TABS: 10.0000 mg | ORAL_TABLET | Freq: Every day | ORAL | Status: DC

## 2013-06-29 NOTE — Patient Instructions (Signed)
Urinary Incontinence Urinary incontinence is the involuntary loss of urine from your bladder. CAUSES  There are many causes of urinary incontinence. They include:  Medicines.  Infections.  Prostatic enlargement, leading to overflow of urine from your bladder.  Surgery.  Neurological diseases.  Emotional factors. SIGNS AND SYMPTOMS Urinary Incontinence can be divided into four types: 1. Urge incontinence. Urge incontinence is the involuntary loss of urine before you have the opportunity to go to the bathroom. There is a sudden urge to void but not enough time to reach a bathroom. 2. Stress incontinence. Stress incontinence is the sudden loss of urine with any activity that forces urine to pass. It is commonly caused by anatomical changes to the pelvis and sphincter areas of your body. 3. Overflow incontinence. Overflow incontinence is the loss of urine from an obstructed opening to your bladder. This results in a backup of urine and a resultant buildup of pressure within the bladder. When the pressure within the bladder exceeds the closing pressure of the sphincter, the urine overflows, which causes incontinence, similar to water overflowing a dam. 4. Total incontinence. Total incontinence is the loss of urine as a result of the inability to store urine within your bladder. DIAGNOSIS  Evaluating the cause of incontinence may require:  A thorough and complete medical and obstetric history.  A complete physical exam.  Laboratory tests such as a urine culture and sensitivities. When additional tests are indicated, they can include:  An ultrasound exam.  Kidney and bladder X-rays.  Cystoscopy. This is an exam of the bladder using a narrow scope.  Urodynamic testing to test the nerve function to the bladder and sphincter areas. TREATMENT  Treatment for urinary incontinence depends on the cause:  For urge incontinence caused by a bacterial infection, antibiotics will be prescribed.  If the urge incontinence is related to medicines you take, your health care provider may have you change the medicine.  For stress incontinence, surgery to re-establish anatomical support to the bladder or sphincter, or both, will often correct the condition.  For overflow incontinence caused by an enlarged prostate, an operation to open the channel through the enlarged prostate will allow the flow of urine out of the bladder. In women with fibroids, a hysterectomy may be recommended.  For total incontinence, surgery on your urinary sphincter may help. An artificial urinary sphincter (an inflatable cuff placed around the urethra) may be required. In women who have developed a hole-like passage between their bladder and vagina (vesicovaginal fistula), surgery to close the fistula often is required. HOME CARE INSTRUCTIONS  Normal daily hygiene and the use of pads or adult diapers that are changed regularly will help prevent odors and skin damage.  Avoid caffeine. It can overstimulate your bladder.  Use the bathroom regularly. Try about every 2-3 hours to go to the bathroom, even if you do not feel the need to do so. Take time to empty your bladder completely. After urinating, wait a minute. Then try to urinate again.  For causes involving nerve dysfunction, keep a log of the medicines you take and a journal of the times you go to the bathroom. SEEK MEDICAL CARE IF:  You experience worsening of pain instead of improvement in pain after your procedure.  Your incontinence becomes worse instead of better. SEE IMMEDIATE MEDICAL CARE IF:  You experience fever or shaking chills.  You are unable to pass your urine.  You have redness spreading into your groin or down into your thighs. MAKE SURE   YOU:   Understand these instructions.   Will watch your condition.  Will get help right away if you are not doing well or get worse. Document Released: 01/26/2004 Document Revised: 10/08/2012 Document  Reviewed: 05/27/2012 Exit[employer] Patient Information 2015 [employer]. This information is not intended to replace advice given to you by your health care provider. Make sure you discuss any questions you have with your health care provider.  

## 2013-06-29 NOTE — Progress Notes (Signed)
Subjective:    Patient ID: Tracie Wiggins, female    DOB: 20-Aug-1945, 68 y.o.   MRN: [id number]  Pateint here today for follow up of chronic medical problems- SHe  C/o peripheral edema that resolves  Over night. Swelling gets really bad during the day.  Hyperlipidemia This is a chronic problem. The current episode started more than 1 year ago. The problem is controlled. Recent lipid tests were reviewed and are normal. Exacerbating diseases include obesity. There are no known factors aggravating her hyperlipidemia. Pertinent negatives include no chest pain, focal weakness, myalgias or shortness of breath. Current antihyperlipidemic treatment includes statins. Compliance problems include adherence to diet and adherence to exercise.  Risk factors for coronary artery disease include obesity and post-menopausal.  Hypertension This is a chronic problem. The current episode started more than 1 year ago. The problem is uncontrolled. Pertinent negatives include no chest pain, headaches, neck pain, palpitations, peripheral edema or shortness of breath. Risk factors for coronary artery disease include stress, obesity, post-menopausal state, dyslipidemia and family history. Past treatments include diuretics. The current treatment provides moderate improvement. Compliance problems include diet and exercise.   GERD Omeprazole daily keeps symptoms  sunder control. Patient likes nexium better Depression Working well- helps to keep her relaxed. Osteoporosis prolia- no c/o side effects  Review of Systems  Constitutional: Negative for fever and chills.  HENT: Positive for congestion, postnasal drip and sinus pressure.   Eyes: Negative.   Respiratory: Negative.  Negative for shortness of breath.   Cardiovascular: Negative for chest pain and palpitations.  Gastrointestinal: Negative.   Genitourinary: Negative.   Musculoskeletal: Negative.  Negative for myalgias and neck pain.  Neurological: Negative for  focal weakness and headaches.  Psychiatric/Behavioral: Negative.   All other systems reviewed and are negative.      Objective:   Physical Exam  Constitutional: She is oriented to person, place, and time. She appears well-developed and well-nourished.  HENT:  Right Ear: Hearing, tympanic membrane, external ear and ear canal normal.  Left Ear: Hearing, tympanic membrane, external ear and ear canal normal.  Nose: Nose normal. Right sinus exhibits no maxillary sinus tenderness and no frontal sinus tenderness. Left sinus exhibits no maxillary sinus tenderness and no frontal sinus tenderness.  Mouth/Throat: Uvula is midline, oropharynx is clear and moist and mucous membranes are normal.  Eyes: EOM are normal.  Neck: Trachea normal, normal range of motion and full passive range of motion without pain. Neck supple. No JVD present. Carotid bruit is not present. No thyromegaly present.  Cardiovascular: Normal rate, regular rhythm, normal heart sounds and intact distal pulses.  Exam reveals no gallop and no friction rub.   No murmur heard. Pulmonary/Chest: Effort normal and breath sounds normal.  Abdominal: Soft. Bowel sounds are normal. She exhibits no distension and no mass. There is no tenderness.  Musculoskeletal: Normal range of motion. She exhibits edema (1+ edema bil Lower ext).  Lymphadenopathy:    She has no cervical adenopathy.  Neurological: She is alert and oriented to person, place, and time. She has normal reflexes.  Skin: Skin is warm and dry.  Psychiatric: She has a normal mood and affect. Her behavior is normal. Judgment and thought content normal.   BP 144/94  Pulse 84  Temp(Src) 98.3 F (36.8 C) (Oral)  Ht '5\' 2"'  (1.575 m)  Wt 242 lb 6.4 oz (109.952 kg)  BMI 44.32 kg/m2        Assessment & Plan:   1. Osteoporosis, unspecified  2. Essential hypertension   3. Hyperlipidemia   4. Gastroesophageal reflux disease without esophagitis   5. Depression   6. BMI  40.0-44.9, adult   7. Weight loss counseling, encounter for   8. Peripheral edema   9. Continuous leakage of urine    Orders Placed This Encounter  Procedures  . CMP14+EGFR  . NMR, lipoprofile   Meds ordered this encounter  Medications  . lisinopril (PRINIVIL,ZESTRIL) 10 MG tablet    Sig: Take 1 tablet (10 mg total) by mouth daily.    Dispense:  90 tablet    Refill:  3    Order Specific Question:  Supervising Provider    Answer:  Chipper Herb [[id number]]  . furosemide (LASIX) 20 MG tablet    Sig: 1/2- 1 po qd    Dispense:  30 tablet    Refill:  3    Order Specific Question:  Supervising Provider    Answer:  Chipper Herb [[id number]]  . Incontinence Supply Disposable (POISE HOURGLASS SHAPE) PADS    Sig: 1 each by Does not apply route 4 (four) times daily - after meals and at bedtime.    Dispense:  120 each    Refill:  11    Order Specific Question:  Supervising Provider    Answer:  Chipper Herb [[id number]]  . Incontinence Supply Disposable (DEPEND SILHOUETTE BRIEFS L/XL) MISC    Sig: 1 each by Does not apply route 3 (three) times daily.    Dispense:  120 each    Refill:  11    Order Specific Question:  Supervising Provider    Answer:  Chipper Herb [[id number]]   Stopped HCTZ_ changed to lisinopril for B/P and Lasix for edema Encouraged to do hemocult cards taht she already has at home Labs pending Health maintenance reviewed Diet and exercise encouraged Continue all meds Follow up  In 3 months   [place], FNP

## 2013-06-30 LAB — NMR, LIPOPROFILE
Cholesterol: 169 mg/dL (ref 100–199)
HDL Cholesterol by NMR: 63 mg/dL (ref >39)
HDL Particle Number: 37.6 umol/L (ref >=30.5)
LDL Particle Number: 1023 nmol/L — ABNORMAL HIGH (ref <1000)
LDL Size: 21.1 nm (ref >20.5)
LDLC SERPL CALC-MCNC: 76 mg/dL (ref 0–99)
LP-IR Score: 49 — ABNORMAL HIGH (ref <=45)
Small LDL Particle Number: 336 nmol/L (ref <=527)
Triglycerides by NMR: 151 mg/dL — ABNORMAL HIGH (ref 0–149)

## 2013-06-30 LAB — CMP14+EGFR
ALT: 24 IU/L (ref 0–32)
AST: 23 IU/L (ref 0–40)
Albumin/Globulin Ratio: 2.1 (ref 1.1–2.5)
Albumin: 4.2 g/dL (ref 3.6–4.8)
Alkaline Phosphatase: 87 IU/L (ref 39–117)
BUN/Creatinine Ratio: 15 (ref 11–26)
BUN: 13 mg/dL (ref 8–27)
CO2: 27 mmol/L (ref 18–29)
Calcium: 9.5 mg/dL (ref 8.7–10.3)
Chloride: 99 mmol/L (ref 97–108)
Creatinine, Ser: 0.87 mg/dL (ref 0.57–1.00)
GFR calc Af Amer: 79 mL/min/{1.73_m2} (ref >59)
GFR calc non Af Amer: 68 mL/min/{1.73_m2} (ref >59)
Globulin, Total: 2 g/dL (ref 1.5–4.5)
Glucose: 89 mg/dL (ref 65–99)
Potassium: 4.6 mmol/L (ref 3.5–5.2)
Sodium: 141 mmol/L (ref 134–144)
Total Bilirubin: 0.3 mg/dL (ref 0.0–1.2)
Total Protein: 6.2 g/dL (ref 6.0–8.5)

## 2013-07-02 ENCOUNTER — Telehealth: Payer: Self-pay | Admitting: Family Medicine

## 2013-07-02 NOTE — Telephone Encounter (Signed)
Message copied by Waverly Ferrari on Thu Jul 02, 2013 11:05 AM ------      Message from: Chevis Pretty      Created: Wed Jul 01, 2013 12:42 PM       Kidney and liver function stable      Cholesterol looks great      Continue current meds- low fat diet and exercise and recheck in 3 months       ------

## 2013-07-02 NOTE — Telephone Encounter (Signed)
Pt aware of lab results 

## 2013-09-09 ENCOUNTER — Telehealth: Payer: Self-pay | Admitting: Family Medicine

## 2013-09-09 NOTE — Telephone Encounter (Signed)
Appt made for 11:50 on 09/17/13 - left message on patient's VM.

## 2013-09-17 ENCOUNTER — Ambulatory Visit (INDEPENDENT_AMBULATORY_CARE_PROVIDER_SITE_OTHER): Payer: Medicare Other | Admitting: Pharmacist

## 2013-09-17 DIAGNOSIS — M810 Age-related osteoporosis without current pathological fracture: Secondary | ICD-10-CM

## 2013-09-17 MED ORDER — DENOSUMAB 60 MG/ML ~~LOC~~ SOLN
60.0000 mg | Freq: Once | SUBCUTANEOUS | Status: AC
  Administered 2013-09-17: 60 mg via SUBCUTANEOUS

## 2013-09-17 NOTE — Progress Notes (Signed)
Patient ID: Tracie Wiggins, female   DOB: 05/15/1945, 68 y.o.   MRN: [id number]  [facility] - here for prolia injection  HPI: Tracie Wiggins has osteoporosis diagnosed in 2012.  She has been injecting Prolia 60mg  SQ every 6 months since her first injection 08/27/2011.  Her last Prolia injection was 03/2013.  Next injection due today  Back Pain?  No       Kyphosis?  No Prior fracture?  Yes - femur                                                             PMH: Age at menopause:  68 yo Hysterectomy?  Yes Oophorectomy?  Yes HRT? Yes - Former.  Type/duration: premarin Steroid Use?  No Thyroid med?  No History of cancer?  No History of digestive disorders (ie Crohn's)?  Yes - GERD Current or previous eating disorders?  No Last Vitamin D Result:  40.9 (09/15/2012) Last GFR Result:  68 (06/29/2013)   FH/SH: Family history of osteoporosis?  No Parent with history of hip fracture?  No Family history of breast cancer?  No Exercise?  Yes - walking some but not much Smoking?  No Alcohol?  No    Calcium Assessment Calcium Intake  # of servings/day  Calcium mg  Milk (8 oz) 1  x  300  = 300mg   Yogurt (4 oz) 0 x  200 = 0  Cheese (1 oz) 0 x  200 = 0  Other Calcium sources   250mg   Ca supplement  =    Estimated calcium intake per day 550mg     DEXA Results Date of Test T-Score for AP Spine L1-L4 T-Score for Total Left Hip T-Score for Total Right Hip  11/19/2012 -0.6 -1.3 -1.4  11/15/2010 -1.4 -1.6 -1.8  09/16/2006 -1.6 -1.5 -1.8  11/23/1999 -1.5 -1.2 -1.8  ** T-Score for left neck of femur 11/15/2010 was -2.5**  Assessment: Osteoporosis with increase in BMD since starting Prolia  Recommendations: 1.  Continue Prolia 60mg  SQ q 6 months - injection given today.  Next will be due 03/2014. 2.  Increase calcium 1200mg  daily through supplementation or diet.  3.  recommend weight bearing exercise - 30 minutes at least 4 days per week.   4.  Counseled and educated about fall  risk and prevention.  Recheck DEXA:  11/2014  Time spent counseling patient:  20 minutes  Tracie Wiggins, PharmD, CPP

## 2013-09-17 NOTE — Patient Instructions (Signed)
Fall Prevention and Home Safety Falls cause injuries and can affect all age groups. It is possible to use preventive measures to significantly decrease the likelihood of falls. There are many simple measures which can make your home safer and prevent falls. OUTDOORS  Repair cracks and edges of walkways and driveways.  Remove high doorway thresholds.  Trim shrubbery on the main path into your home.  Have good outside lighting.  Clear walkways of tools, rocks, debris, and clutter.  Check that handrails are not broken and are securely fastened. Both sides of steps should have handrails.  Have leaves, snow, and ice cleared regularly.  Use sand or salt on walkways during winter months.  In the garage, clean up grease or oil spills. BATHROOM  Install night lights.  Install grab bars by the toilet and in the tub and shower.  Use non-skid mats or decals in the tub or shower.  Place a plastic non-slip stool in the shower to sit on, if needed.  Keep floors dry and clean up all water on the floor immediately.  Remove soap buildup in the tub or shower on a regular basis.  Secure bath mats with non-slip, double-sided rug tape.  Remove throw rugs and tripping hazards from the floors. BEDROOMS  Install night lights.  Make sure a bedside light is easy to reach.  Do not use oversized bedding.  Keep a telephone by your bedside.  Have a firm chair with side arms to use for getting dressed.  Remove throw rugs and tripping hazards from the floor. KITCHEN  Keep handles on pots and pans turned toward the center of the stove. Use back burners when possible.  Clean up spills quickly and allow time for drying.  Avoid walking on wet floors.  Avoid hot utensils and knives.  Position shelves so they are not too high or low.  Place commonly used objects within easy reach.  If necessary, use a sturdy step stool with a grab bar when reaching.  Keep electrical cables out of the  way.  Do not use floor polish or wax that makes floors slippery. If you must use wax, use non-skid floor wax.  Remove throw rugs and tripping hazards from the floor. STAIRWAYS  Never leave objects on stairs.  Place handrails on both sides of stairways and use them. Fix any loose handrails. Make sure handrails on both sides of the stairways are as long as the stairs.  Check carpeting to make sure it is firmly attached along stairs. Make repairs to worn or loose carpet promptly.  Avoid placing throw rugs at the top or bottom of stairways, or properly secure the rug with carpet tape to prevent slippage. Get rid of throw rugs, if possible.  Have an electrician put in a light switch at the top and bottom of the stairs. OTHER FALL PREVENTION TIPS  Wear low-heel or rubber-soled shoes that are supportive and fit well. Wear closed toe shoes.  When using a stepladder, make sure it is fully opened and both spreaders are firmly locked. Do not climb a closed stepladder.  Add color or contrast paint or tape to grab bars and handrails in your home. Place contrasting color strips on first and last steps.  Learn and use mobility aids as needed. Install an electrical emergency response system.  Turn on lights to avoid dark areas. Replace light bulbs that burn out immediately. Get light switches that glow.  Arrange furniture to create clear pathways. Keep furniture in the same place.    Firmly attach carpet with non-skid or double-sided tape.  Eliminate uneven floor surfaces.  Select a carpet pattern that does not visually hide the edge of steps.  Be aware of all pets. OTHER HOME SAFETY TIPS  Set the water temperature for 120 F (48.8 C).  Keep emergency numbers on or near the telephone.  Keep smoke detectors on every level of the home and near sleeping areas. Document Released: 12/08/2001 Document Revised: 06/19/2011 Document Reviewed: 03/09/2011 Exit[employer] Patient Information 2015  [employer]. This information is not intended to replace advice given to you by your health care provider. Make sure you discuss any questions you have with your health care provider.                Exercise for Strong Bones  Exercise is important to build and maintain strong bones / bone density.  There are 2 types of exercises that are important to building and maintaining strong bones:  Weight- bearing and muscle-stregthening.  Weight-bearing Exercises  These exercises include activities that make you move against gravity while staying upright. Weight-bearing exercises can be high-impact or low-impact.  High-impact weight-bearing exercises help build bones and keep them strong. If you have broken a bone due to osteoporosis or are at risk of breaking a bone, you may need to avoid high-impact exercises. If you're not sure, you should check with your healthcare provider.  Examples of high-impact weight-bearing exercises are: Dancing  Doing high-impact aerobics  Hiking  Jogging/running  Jumping Rope  Stair climbing  Tennis  Low-impact weight-bearing exercises can also help keep bones strong and are a safe alternative if you cannot do high-impact exercises.   Examples of low-impact weight-bearing exercises are: Using elliptical training machines  Doing low-impact aerobics  Using stair-step machines  Fast walking on a treadmill or outside   Muscle-Strengthening Exercises These exercises include activities where you move your body, a weight or some other resistance against gravity. They are also known as resistance exercises and include: Lifting weights  Using elastic exercise bands  Using weight machines  Lifting your own body weight  Functional movements, such as standing and rising up on your toes  Yoga and Pilates can also improve strength, balance and flexibility. However, certain positions may not be safe for people with osteoporosis or those at increased risk of broken  bones. For example, exercises that have you bend forward may increase the chance of breaking a bone in the spine.   Non-Impact Exercises There are other types of exercises that can help prevent falls.  Non-impact exercises can help you to improve balance, posture and how well you move in everyday activities. Some of these exercises include: Balance exercises that strengthen your legs and test your balance, such as Tai Chi, can decrease your risk of falls.  Posture exercises that improve your posture and reduce rounded or "sloping" shoulders can help you decrease the chance of breaking a bone, especially in the spine.  Functional exercises that improve how well you move can help you with everyday activities and decrease your chance of falling and breaking a bone. For example, if you have trouble getting up from a chair or climbing stairs, you should do these activities as exercises.   **A physical therapist can teach you balance, posture and functional exercises. He/she can also help you learn which exercises are safe and appropriate for you.  [facility] has a physical therapy office in [place] in front of our office and referrals can be made for assessments   and treatment as needed and strength and balance training.  If you would like to have an assessment with [facility] and our physical therapy team please let a nurse or provider know.    

## 2013-10-01 ENCOUNTER — Ambulatory Visit (INDEPENDENT_AMBULATORY_CARE_PROVIDER_SITE_OTHER): Payer: Medicare Other

## 2013-10-01 DIAGNOSIS — Z23 Encounter for immunization: Secondary | ICD-10-CM

## 2013-12-28 ENCOUNTER — Encounter: Payer: Self-pay | Admitting: Nurse Practitioner

## 2013-12-28 ENCOUNTER — Ambulatory Visit (INDEPENDENT_AMBULATORY_CARE_PROVIDER_SITE_OTHER): Payer: Medicare Other | Admitting: Nurse Practitioner

## 2013-12-28 VITALS — BP 157/88 | HR 81 | Temp 97.3°F | Ht 62.0 in | Wt 246.0 lb

## 2013-12-28 DIAGNOSIS — K219 Gastro-esophageal reflux disease without esophagitis: Secondary | ICD-10-CM

## 2013-12-28 DIAGNOSIS — F32A Depression, unspecified: Secondary | ICD-10-CM

## 2013-12-28 DIAGNOSIS — F329 Major depressive disorder, single episode, unspecified: Secondary | ICD-10-CM

## 2013-12-28 DIAGNOSIS — E785 Hyperlipidemia, unspecified: Secondary | ICD-10-CM

## 2013-12-28 DIAGNOSIS — N393 Stress incontinence (female) (male): Secondary | ICD-10-CM

## 2013-12-28 DIAGNOSIS — I10 Essential (primary) hypertension: Secondary | ICD-10-CM

## 2013-12-28 MED ORDER — SERTRALINE HCL 50 MG PO TABS: 50.0000 mg | ORAL_TABLET | Freq: Every day | ORAL | Status: DC

## 2013-12-28 MED ORDER — OMEPRAZOLE 40 MG PO CPDR: 40.0000 mg | DELAYED_RELEASE_CAPSULE | Freq: Every day | ORAL | Status: DC

## 2013-12-28 MED ORDER — LISINOPRIL 10 MG PO TABS: 10.0000 mg | ORAL_TABLET | Freq: Every day | ORAL | Status: DC

## 2013-12-28 MED ORDER — ATORVASTATIN CALCIUM 10 MG PO TABS: 10.0000 mg | ORAL_TABLET | Freq: Every day | ORAL | Status: DC

## 2013-12-28 NOTE — Progress Notes (Signed)
Subjective:    Patient ID: Tracie Wiggins, female    DOB: August 04, 1945, 68 y.o.   MRN: [id number]  Pateint here today for follow up of chronic medical problems-   Hyperlipidemia This is a chronic problem. The current episode started more than 1 year ago. The problem is controlled. Recent lipid tests were reviewed and are variable. Pertinent negatives include no chest pain, myalgias or shortness of breath. Current antihyperlipidemic treatment includes statins. The current treatment provides moderate improvement of lipids. Compliance problems include adherence to diet and adherence to exercise.  Risk factors for coronary artery disease include dyslipidemia, hypertension, post-menopausal and obesity.  Hypertension This is a chronic problem. The current episode started more than 1 year ago. The problem has been waxing and waning since onset. The problem is controlled. Pertinent negatives include no chest pain, headaches, neck pain, palpitations or shortness of breath. Risk factors for coronary artery disease include dyslipidemia, obesity and post-menopausal state. Past treatments include ACE inhibitors. The current treatment provides moderate improvement. Compliance problems include diet and exercise.   GERD Omeprazole daily keeps symptoms  sunder control. Patient likes nexium better Depression Working well- helps to keep her relaxed. Osteoporosis prolia- no c/o side effects  Review of Systems  Constitutional: Negative for fever and chills.  HENT: Positive for congestion, postnasal drip and sinus pressure.   Eyes: Negative.   Respiratory: Negative.  Negative for shortness of breath.   Cardiovascular: Negative for chest pain and palpitations.  Gastrointestinal: Negative.   Genitourinary: Negative.   Musculoskeletal: Negative.  Negative for myalgias and neck pain.  Neurological: Negative for headaches.  Psychiatric/Behavioral: Negative.   All other systems reviewed and are negative.       Objective:   Physical Exam  Constitutional: She is oriented to person, place, and time. She appears well-developed and well-nourished.  HENT:  Nose: Nose normal.  Mouth/Throat: Oropharynx is clear and moist.  Eyes: EOM are normal.  Neck: Trachea normal, normal range of motion and full passive range of motion without pain. Neck supple. No JVD present. Carotid bruit is not present. No thyromegaly present.  Cardiovascular: Normal rate, regular rhythm, normal heart sounds and intact distal pulses.  Exam reveals no gallop and no friction rub.   No murmur heard. Pulmonary/Chest: Effort normal and breath sounds normal.  Abdominal: Soft. Bowel sounds are normal. She exhibits no distension and no mass. There is no tenderness.  Musculoskeletal: Normal range of motion.  Lymphadenopathy:    She has no cervical adenopathy.  Neurological: She is alert and oriented to person, place, and time. She has normal reflexes.  Skin: Skin is warm and dry.  Psychiatric: She has a normal mood and affect. Her behavior is normal. Judgment and thought content normal.   BP 157/88 mmHg  Pulse 81  Temp(Src) 97.3 F (36.3 C) (Oral)  Ht _0  (1.575 m)  Wt 246 lb (111.585 kg)  BMI 44.98 kg/m2        Assessment & Plan:   1. Essential hypertension Do not add salt to diet - CMP14+EGFR - lisinopril (PRINIVIL,ZESTRIL) 10 MG tablet; Take 1 tablet (10 mg total) by mouth daily.  Dispense: 90 tablet; Refill: 3  2. Gastroesophageal reflux disease without esophagitis Avoid spicy and fatty foods Do not eat 2 hours prior to bedtime - omeprazole (PRILOSEC) 40 MG capsule; Take 1 capsule (40 mg total) by mouth daily.  Dispense: 30 capsule; Refill: 5  3. Depression Stress amanegement - sertraline (ZOLOFT) 50 MG tablet; Take 1 tablet (50  mg total) by mouth daily.  Dispense: 30 tablet; Refill: 5  4. Hyperlipidemia Low fat - NMR, lipoprofile - atorvastatin (LIPITOR) 10 MG tablet; Take 1 tablet (10 mg total) by mouth  daily.  Dispense: 30 tablet; Refill: 5  5. Stress incontinence - Ambulatory referral to Urology    Labs pending Health maintenance reviewed Diet and exercise encouraged Continue all meds Follow up  In 3 months   [address], FNP

## 2013-12-28 NOTE — Patient Instructions (Signed)

## 2013-12-29 ENCOUNTER — Telehealth: Payer: Self-pay | Admitting: Family Medicine

## 2013-12-29 ENCOUNTER — Telehealth: Payer: Self-pay

## 2013-12-29 LAB — NMR, LIPOPROFILE
Cholesterol: 165 mg/dL (ref 100–199)
HDL Cholesterol by NMR: 64 mg/dL (ref >39)
HDL Particle Number: 36.6 umol/L (ref >=30.5)
LDL Particle Number: 926 nmol/L (ref <1000)
LDL Size: 21 nm (ref >20.5)
LDL-C: 69 mg/dL (ref 0–99)
LP-IR Score: 46 — ABNORMAL HIGH (ref <=45)
Small LDL Particle Number: 306 nmol/L (ref <=527)
Triglycerides by NMR: 162 mg/dL — ABNORMAL HIGH (ref 0–149)

## 2013-12-29 LAB — CMP14+EGFR
ALT: 20 IU/L (ref 0–32)
AST: 15 IU/L (ref 0–40)
Albumin/Globulin Ratio: 2.1 (ref 1.1–2.5)
Albumin: 4.1 g/dL (ref 3.6–4.8)
Alkaline Phosphatase: 82 IU/L (ref 39–117)
BUN/Creatinine Ratio: 13 (ref 11–26)
BUN: 11 mg/dL (ref 8–27)
CO2: 25 mmol/L (ref 18–29)
Calcium: 9.4 mg/dL (ref 8.7–10.3)
Chloride: 102 mmol/L (ref 97–108)
Creatinine, Ser: 0.85 mg/dL (ref 0.57–1.00)
GFR calc Af Amer: 81 mL/min/{1.73_m2} (ref >59)
GFR calc non Af Amer: 70 mL/min/{1.73_m2} (ref >59)
Globulin, Total: 2 g/dL (ref 1.5–4.5)
Glucose: 106 mg/dL — ABNORMAL HIGH (ref 65–99)
Potassium: 4.5 mmol/L (ref 3.5–5.2)
Sodium: 142 mmol/L (ref 134–144)
Total Bilirubin: 0.4 mg/dL (ref 0.0–1.2)
Total Protein: 6.1 g/dL (ref 6.0–8.5)

## 2013-12-29 NOTE — Telephone Encounter (Signed)
-----   Message from [facility], [place] sent at 12/29/2013  8:12 AM EST ----- Kidney and liver function stable Cholesterol looks great Continue current meds- low fat diet and exercise and recheck in 3 months

## 2013-12-29 NOTE — Telephone Encounter (Signed)
-----   Message from [facility], [place] sent at 12/29/2013  8:12 AM EST ----- Kidney and liver function stable Cholesterol looks great Continue current meds- low fat diet and exercise and recheck in 3 months

## 2013-12-29 NOTE — Telephone Encounter (Signed)
Results left on vm as per DPR of 06/2012 

## 2013-12-29 NOTE — Telephone Encounter (Signed)
Patient aware.

## 2014-02-26 ENCOUNTER — Telehealth: Payer: Self-pay | Admitting: Nurse Practitioner

## 2014-03-03 ENCOUNTER — Other Ambulatory Visit: Payer: Self-pay | Admitting: Nurse Practitioner

## 2014-03-03 DIAGNOSIS — N6310 Unspecified lump in the right breast, unspecified quadrant: Secondary | ICD-10-CM

## 2014-03-04 ENCOUNTER — Telehealth: Payer: Self-pay | Admitting: Nurse Practitioner

## 2014-03-04 NOTE — Telephone Encounter (Signed)
There is documentation for a breast ultrasound but I do not see that it was scheduled.

## 2014-03-16 ENCOUNTER — Other Ambulatory Visit: Payer: Self-pay | Admitting: Nurse Practitioner

## 2014-03-16 ENCOUNTER — Ambulatory Visit (HOSPITAL_COMMUNITY)
Admission: RE | Admit: 2014-03-16 | Discharge: 2014-03-16 | Disposition: A | Discharge status: Home / Self Care | Payer: Medicare Other | Source: Ambulatory Visit | Attending: Nurse Practitioner | Admitting: Nurse Practitioner

## 2014-03-16 DIAGNOSIS — N6310 Unspecified lump in the right breast, unspecified quadrant: Secondary | ICD-10-CM

## 2014-03-16 DIAGNOSIS — N63 Unspecified lump in breast: Secondary | ICD-10-CM | POA: Diagnosis not present

## 2014-03-16 DIAGNOSIS — R928 Other abnormal and inconclusive findings on diagnostic imaging of breast: Secondary | ICD-10-CM | POA: Diagnosis not present

## 2014-03-19 ENCOUNTER — Ambulatory Visit (INDEPENDENT_AMBULATORY_CARE_PROVIDER_SITE_OTHER): Payer: Medicare Other | Admitting: Urology

## 2014-03-19 DIAGNOSIS — N952 Postmenopausal atrophic vaginitis: Secondary | ICD-10-CM

## 2014-03-19 DIAGNOSIS — N3946 Mixed incontinence: Secondary | ICD-10-CM | POA: Diagnosis not present

## 2014-03-23 ENCOUNTER — Encounter (HOSPITAL_COMMUNITY): Payer: Self-pay

## 2014-03-23 ENCOUNTER — Ambulatory Visit (HOSPITAL_COMMUNITY)
Admission: RE | Admit: 2014-03-23 | Discharge: 2014-03-23 | Disposition: A | Discharge status: Home / Self Care | Payer: Medicare Other | Source: Ambulatory Visit | Attending: Nurse Practitioner | Admitting: Nurse Practitioner

## 2014-03-23 ENCOUNTER — Other Ambulatory Visit: Payer: Self-pay | Admitting: Nurse Practitioner

## 2014-03-23 ENCOUNTER — Encounter (HOSPITAL_COMMUNITY): Payer: Medicare Other

## 2014-03-23 DIAGNOSIS — N6310 Unspecified lump in the right breast, unspecified quadrant: Secondary | ICD-10-CM

## 2014-03-23 DIAGNOSIS — N63 Unspecified lump in breast: Secondary | ICD-10-CM | POA: Insufficient documentation

## 2014-03-23 MED ORDER — LIDOCAINE-EPINEPHRINE (PF) 1 %-1:200000 IJ SOLN
INTRAMUSCULAR | Status: AC
  Filled 2014-03-23: qty 10

## 2014-03-23 NOTE — Discharge Instructions (Signed)
Breast Biopsy A breast biopsy is a procedure where a sample of breast tissue is removed from your breast. The tissue is examined under a microscope to see if cancerous cells are present. A breast biopsy is done when there is:  Any undiagnosed breast mass (tumor).  Nipple abnormalities, dimpling, crusting, or ulcerations.  Abnormal discharge from the nipple, especially blood.  Redness, swelling, and pain of the breast.  Calcium deposits (calcifications) or abnormalities seen on a mammogram, ultrasound result, or results of magnetic resonance imaging (MRI).  Suspicious changes in the breast seen on your mammogram. If the tumor is found to be cancerous (malignant), a breast biopsy can help to determine what the best treatment is for you. There are many different types of breast biopsies. Talk to your caregiver about your options and which type is best for you. LET YOUR CAREGIVER KNOW ABOUT:  Allergies to food or medicine.  Medicines taken, including vitamins, herbs, eyedrops, over-the-counter medicines, and creams.  Use of steroids (by mouth or creams).  Previous problems with anesthetics or numbing medicines.  History of bleeding problems or blood clots.  Previous surgery.  Other health problems, including diabetes and kidney problems.  Any recent colds or infections.  Possibility of pregnancy, if this applies. RISKS AND COMPLICATIONS   Bleeding.  Infection.  Allergy to medicines.  Bruising and swelling of the breast.  Alteration in the shape of the breast.  Not finding the lump or abnormality.  Needing more surgery. BEFORE THE PROCEDURE  Arrange for someone to drive you home after the procedure.  Do not smoke for 2 weeks before the procedure. Stop smoking, if you smoke.  Do not drink alcohol for 24 hours before procedure.  Wear a good support bra to the procedure. PROCEDURE  You may be given a medicine to numb the breast area (local anesthesia) or a medicine  to make you sleep (general anesthesia) during the procedure. The following are the different types of biopsies that can be performed.   Fine-needle aspiration--A thin needle is attached to a syringe and inserted into the breast lump. Fluid and cells are removed and then looked at under a microscope. If the breast lump cannot be felt, an ultrasound may be used to help locate the lump and place the needle in the correct area.   Core needle biopsy--A wide, hollow needle (core needle) is inserted into the breast lump 3-6 times to get tissue samples or cores. The samples are removed. The needle is usually placed in the correct area by using an ultrasound or X-ray.   Stereotactic biopsy--X-ray equipment and a computer are used to analyze X-ray pictures of the breast lump. The computer then finds exactly where the core needle needs to be inserted. Tissue samples are removed.   Vacuum-assisted biopsy--A small incision (less than  inch) is made in your breast. A biopsy device that includes a hollow needle and vacuum is passed through the incision and into the breast tissue. The vacuum gently draws abnormal breast tissue into the needle to remove it. This type of biopsy removes a larger tissue sample than a regular core needle biopsy. No stitches are needed, and there is usually little scarring.  Ultrasound-guided core needle biopsy--A high frequency ultrasound helps guide the core needle to the area of the mass or abnormality. An incision is made to insert the needle. Tissue samples are removed.  Open biopsy--A larger incision is made in the breast. Your caregiver will attempt to remove the whole breast lump or   as much as possible. AFTER THE PROCEDURE  You will be taken to the recovery area. If you are doing well and have no problems, you will be allowed to go home.  You may notice bruising on your breast. This is normal.  Your caregiver may apply a pressure dressing on your breast for 24-48 hours. A  pressure dressing is a bandage that is wrapped tightly around the chest to stop fluid from collecting underneath tissues. Document Released: 12/18/2004 Document Revised: 04/14/2012 Document Reviewed: 01/18/2011 [employer] Patient Information 2015 [employer]. This information is not intended to replace advice given to you by your health care provider. Make sure you discuss any questions you have with your health care provider.   

## 2014-03-29 ENCOUNTER — Telehealth: Payer: Self-pay | Admitting: Nurse Practitioner

## 2014-03-29 NOTE — Telephone Encounter (Signed)
Received paperwork stating it was approved - thanks

## 2014-03-31 ENCOUNTER — Other Ambulatory Visit: Payer: Self-pay | Admitting: Pharmacist

## 2014-03-31 MED ORDER — DENOSUMAB 60 MG/ML ~~LOC~~ SOLN: 60.0000 mg | SUBCUTANEOUS | Status: DC

## 2014-04-07 ENCOUNTER — Ambulatory Visit (INDEPENDENT_AMBULATORY_CARE_PROVIDER_SITE_OTHER): Payer: Medicare Other | Admitting: Pharmacist

## 2014-04-07 ENCOUNTER — Encounter: Payer: Self-pay | Admitting: Pharmacist

## 2014-04-07 VITALS — BP 150/84 | HR 76 | Ht 62.0 in | Wt 241.0 lb

## 2014-04-07 DIAGNOSIS — N3281 Overactive bladder: Secondary | ICD-10-CM

## 2014-04-07 DIAGNOSIS — M810 Age-related osteoporosis without current pathological fracture: Secondary | ICD-10-CM

## 2014-04-07 DIAGNOSIS — Z23 Encounter for immunization: Secondary | ICD-10-CM

## 2014-04-07 DIAGNOSIS — Z0000 Encounter for general adult medical examination without abnormal findings: Secondary | ICD-10-CM | POA: Diagnosis not present

## 2014-04-07 MED ORDER — LISINOPRIL 20 MG PO TABS: 20.0000 mg | ORAL_TABLET | Freq: Every day | ORAL | Status: DC

## 2014-04-07 MED ORDER — DENOSUMAB 60 MG/ML ~~LOC~~ SOLN
60.0000 mg | Freq: Once | SUBCUTANEOUS | Status: AC
  Administered 2014-04-07: 60 mg via SUBCUTANEOUS

## 2014-04-07 NOTE — Addendum Note (Signed)
Addended by: Rolena Infante on: 04/07/2014 02:05 PM   Modules accepted: Orders

## 2014-04-07 NOTE — Patient Instructions (Signed)
Increase lisinopril to 10mg  2 tablet a day until finished with current prescription then increase to 20mg  daily.  Sent referral for Bone Density recheck / DEXA for August  / September  Remember to bring in stool test to check for blood in stool / bowel movement   Increase non-starchy vegetables - carrots, green bean, squash, zucchini, tomatoes, onions, peppers, spinach and other green leafy vegetables, cabbage, lettuce, cucumbers, asparagus, okra (not fried), eggplant limit sugar and processed foods (cakes, cookies, ice cream, crackers and chips) Increase fresh fruit but limit serving sizes 1/2 cup or about the size of tennis or baseball limit red meat to no more than 1-2 times per week (serving size about the size of your palm) Choose whole grains / lean proteins - whole wheat bread, quinoa, whole grain rice (1/2 cup), fish, chicken, [place]  Continue with walking daily - work up to 15 minutes daily as able.   Pneumococcal Conjugate Vaccine: What You Need to Know You received a dose of PCV13 today. 1. Why get vaccinated? Pneumococcal conjugate vaccine (called PCV13 or Prevnar 13) is recommended to protect infants and toddlers, and some older children and adults with certain health conditions, from pneumococcal disease. Pneumococcal disease is caused by infection with Streptococcus pneumoniae bacteria. These bacteria can spread from person to person through close contact. Pneumococcal disease can lead to severe health problems, including pneumonia, blood infections, and meningitis. Meningitis is an infection of the covering of the brain. Pneumococcal meningitis is fairly rare (less than 1 case per 100,000 people each year), but it leads to other health problems, including deafness and brain damage. In children, it is fatal in about 1 case out of 10. Children younger than two are at higher risk for serious disease than older children. People with certain medical conditions, people over age 42,  and cigarette smokers are also at higher risk. Before vaccine, pneumococcal infections caused many problems each year in the [place] in children younger than 5, including:  more than 700 cases of meningitis,  13,000 blood infections,  about 5 million ear infections, and  about 200 deaths. About 4,000 adults still die each year because of pneumococcal infections. Pneumococcal infections can be hard to treat because some strains are resistant to antibiotics. This makes prevention through vaccination even more important. 2. PCV13 vaccine There are more than 90 types of pneumococcal bacteria. PCV13 protects against 13 of them. These 13 strains cause most severe infections in children and about half of infections in adults.  PCV13 is routinely given to children at 2, 4, 6, and 59-41 months of age. Children in this age range are at greatest risk for serious diseases caused by pneumococcal infection. PCV13 vaccine may also be recommended for some older children or adults. Your doctor can give you details. A second type of pneumococcal vaccine, called PPSV23, may also be given to some children and adults, including anyone over age 64. There is a separate Vaccine Information Statement for this vaccine. 3. Precautions  Anyone who has ever had a life-threatening allergic reaction to a dose of this vaccine, to an earlier pneumococcal vaccine called PCV7 (or Prevnar), or to any vaccine containing diphtheria toxoid (for example, DTaP), should not get PCV13. Anyone with a severe allergy to any component of PCV13 should not get the vaccine. Tell your doctor if the person being vaccinated has any severe allergies. If the person scheduled for vaccination is sick, your doctor might decide to reschedule the shot on another day. Your doctor  can give you more information about any of these precautions. 4. What are the risks of PCV13 vaccine?  With any medicine, including vaccines, there is a chance of side  effects. These are usually mild and go away on their own, but serious reactions are also possible. Reported problems associated with PCV13 vary by dose and age, but generally:  About half of children became drowsy after the shot, had a temporary loss of appetite, or had redness or tenderness where the shot was given.  About 1 out of 3 had swelling where the shot was given.  About 1 out of 3 had a mild fever, and about 1 in 20 had a higher fever (over 102.54F).  Up to about 8 out of 10 became fussy or irritable. Adults receiving the vaccine have reported redness, pain, and swelling where the shot was given. Mild fever, fatigue, headache, chills, or muscle pain have also been reported. Life-threatening allergic reactions from any vaccine are very rare. 5. What if there is a serious reaction? What should I look for?  Look for anything that concerns you, such as signs of a severe allergic reaction, very high fever, or behavior changes. Signs of a severe allergic reaction can include hives, swelling of the face and throat, difficulty breathing, a fast heartbeat, dizziness, and weakness. These would start a few minutes to a few hours after the vaccination. What should I do?  If you think it is a severe allergic reaction or other emergency that can't wait, call 9-1-1 or get the person to the nearest hospital. Otherwise, call your doctor.  Afterward, the reaction should be reported to the Vaccine Adverse Event Reporting System (VAERS). Your doctor might file this report, or you can do it yourself through the VAERS web site at www.vaers.SamedayNews.es, or by calling 231-578-6853. VAERS is only for reporting reactions. They do not give medical advice. 6. The National Vaccine Injury Compensation Program The [employer] Vaccine Injury Compensation Program (VICP) is a federal program that was created to compensate people who may have been injured by certain vaccines. Persons who believe they may have been  injured by a vaccine can learn about the program and about filing a claim by calling (336) 256-7038 or visiting the [place] website at GoldCloset.com.ee. 7. How can I learn more?  Ask your doctor.  Call your local or state health department.  Contact the Centers for Disease Control and Prevention (CDC):  Call 253-003-1513 (1-800-CDC-INFO) or  Visit CDC's website at http://hunter.com/ CDC PCV13 Vaccine VIS (Interim) (02/28/11) Document Released: 10/15/2005 Document Revised: 05/04/2013 Document Reviewed: 02/06/2013 [facility] Patient Information 2015 [place], [place]. This information is not intended to replace advice given to you by your health care provider. Make sure you discuss any questions you have with your health care provider.

## 2014-04-07 NOTE — Progress Notes (Signed)
Patient ID: Tracie Wiggins, female   DOB: [birth year], 69 y.o.   MRN: [id number]    Subjective:   Tracie Wiggins is a 69 y.o. female who presents for an Initial Medicare Annual Wellness Visit and for Prolia administration - treatment for osteoporosis.  Current Medications (verified) Outpatient Encounter Prescriptions as of 04/07/2014  Medication Sig  . atorvastatin (LIPITOR) 10 MG tablet Take 1 tablet (10 mg total) by mouth daily.  [name] denosumab (PROLIA) 60 MG/ML SOLN injection Inject 60 mg into the skin every 6 (six) months. Administer in upper arm, thigh, or abdomen  . docusate sodium (COLACE) 100 MG capsule Take 100 mg by mouth 2 (two) times daily as needed.   . Omega-3 Fatty Acids (FISH OIL) 1000 MG CAPS Take 1 capsule by mouth daily.   [name] omeprazole (PRILOSEC) 40 MG capsule Take 1 capsule (40 mg total) by mouth daily.  . solifenacin (VESICARE) 10 MG tablet Take 10 mg by mouth daily.  . [DISCONTINUED] lisinopril (PRINIVIL,ZESTRIL) 10 MG tablet Take 1 tablet (10 mg total) by mouth daily.  . Calcium Citrate-Vitamin D (CITRACAL + D PO) Take 1 tablet by mouth 2 (two) times daily.  . Incontinence Supply Disposable (DEPEND SILHOUETTE BRIEFS L/XL) MISC 1 each by Does not apply route 3 (three) times daily.  . Incontinence Supply Disposable (POISE HOURGLASS SHAPE) PADS 1 each by Does not apply route 4 (four) times daily - after meals and at bedtime.  [name] lisinopril (PRINIVIL,ZESTRIL) 20 MG tablet Take 1 tablet (20 mg total) by mouth daily.  . sertraline (ZOLOFT) 50 MG tablet Take 1 tablet (50 mg total) by mouth daily. (Patient taking differently: Take 25 mg by mouth daily. )  . [DISCONTINUED] furosemide (LASIX) 20 MG tablet 1/2- 1 po qd (Patient not taking: Reported on 04/07/2014)  . [EXPIRED] denosumab (PROLIA) injection 60 mg     Allergies (verified) Asa; Codeine; Erythromycin; and Penicillins   History: Past Medical History  Diagnosis Date  . Anxiety   . GERD (gastroesophageal reflux  disease)   . Hypertension   . Hyperlipidemia   . Osteoporosis   . Depression   . Cataract    Past Surgical History  Procedure Laterality Date  . Abdominal hysterectomy    . Cholecystectomy    . Appendectomy     Family History  Problem Relation Age of Onset  . COPD Mother   . Stroke Mother   . Heart disease Father   . Heart attack Father   . Heart disease Sister   . Diabetes Sister   . Heart attack Sister   . Stroke Sister    Social History   Occupational History  . Not on file.   Social History Main Topics  . Smoking status: Never Smoker   . Smokeless tobacco: Never Used  . Alcohol Use: No  . Drug Use: No  . Sexual Activity: No     Dietary issues and exercise activities: Current Exercise Habits:: Home exercise routine, Type of exercise: walking, Time (Minutes): 10, Frequency (Times/Week): 6, Weekly Exercise (Minutes/Week): 60, Intensity: Mild  Current Dietary habits:  Patient has been trying to limit intake and serving sizes.  She is eating more vegetables and fruits and less meats.  She has lost about 5# since 12/2013  Objective:    Today's Vitals   04/07/14 1231  BP: 150/84  Pulse: 76  Height: 5\' 2"  (1.575 m)  Weight: 241 lb (109.317 kg)   Body mass index is 44.07 kg/(m^2).  Activities of  Daily Living In your present state of health, do you have any difficulty performing the following activities: 04/07/2014 12/28/2013  Is the patient deaf or have difficulty hearing? N N  Hearing N N  Vision N N  Difficulty concentrating or making decisions N N  Walking or climbing stairs? N N  Doing errands, shopping? N N  Preparing Food and eating ? N -  Using the Toilet? N -  In the past six months, have you accidently leaked urine? Y -  Do you have problems with loss of bowel control? N -  Managing your Medications? N -  Managing your Finances? N -  Housekeeping or managing your Housekeeping? N -    Are there smokers in your home (other than you)?  No   Cardiac Risk Factors include: advanced age (>76men, >99 women);dyslipidemia;family history of premature cardiovascular disease;hypertension;obesity (BMI >30kg/m2)  Depression Screen PHQ 2/9 Scores 04/07/2014 12/28/2013 06/29/2013 03/23/2013  PHQ - 2 Score 1 0 0 0    Fall Risk Fall Risk  04/07/2014 12/28/2013 06/29/2013 03/23/2013 12/22/2012  Falls in the past year? No No No No No    Cognitive Function: MMSE - Mini Mental State Exam 04/07/2014 04/07/2014  Orientation to time 5 5  Orientation to Place 5 5  Registration 3 3  Attention/ Calculation 5 5  Recall 2 2  Language- name 2 objects 2 2  Language- repeat 1 1  Language- follow 3 step command 3 3  Language- read & follow direction 1 -  Write a sentence 1 -  Copy design 0 -  Total score 28 -    Immunizations and Health Maintenance Immunization History  Administered Date(s) Administered  . Influenza Split 10/13/2012  . Influenza,inj,Quad PF,36+ Mos 10/01/2013   Health Maintenance Due  Topic Date Due  . COLON CANCER SCREENING ANNUAL FOBT  03/24/1994  . ZOSTAVAX  03/23/2004  . PNA vac Low Risk Adult (1 of 2 - PCV13) 03/23/2009    Patient Care Team: Chevis Pretty, FNP as PCP - General (Nurse Practitioner) Particia Nearing, OD as Consulting Physician (Optometry) Irine Seal, MD as Attending Physician (Urology)  Indicate any recent Medical Services you may have received from other than [facility] providers in the past year (date may be approximate).    Assessment:    Annual Wellness Visit  Osteoporosis HTN - no currently controlled Obesity - Is exercising more and has changed diet with loss of 5# recently  Screening Tests Health Maintenance  Topic Date Due  . COLON CANCER SCREENING ANNUAL FOBT  03/24/1994  . ZOSTAVAX  03/23/2004  . PNA vac Low Risk Adult (1 of 2 - PCV13) 03/23/2009  . INFLUENZA VACCINE  08/02/2014  . TETANUS/TDAP  01/02/2016  . MAMMOGRAM  03/22/2016  . COLONOSCOPY  01/10/2022  . DEXA SCAN  Completed         Plan:   During the course of the visit Meriem was educated and counseled about the following appropriate screening and preventive services:   Vaccines to include Pneumoccal, Influenza, ,Td, Zostavax - Patient received Prevnar 6 in office today.  Discussed getting Bootrix / Tdap and Zostavax in future depending on cost  Prolia 60mg  administered SQ in office today  Colorectal cancer screening - colonoscopy - UTD;  Reminded patient to bring FOBT which she has at home already  Cardiovascular disease screening - lipids done 12/2013 - Triglycerides elevated; LDL at goal; -patient has changed diet which should help with getting triglycerides to goal  Diabetes screening - UTD;  FBG was slightly elevated - patient to trying to lose weight and has changed diet which should help with FBG  Bone Denisty / Osteoporosis Screening - due 08/2014 - future order placed today  Mammogram - UTD  PAP - not recommended due to age and normal past PAP's  Glaucoma screening  - last eye exam for Dr Radford Pax with in last year - will request copy of last exam  Nutrition counseling - continue current changes - has lost 5#  Advanced Directives - patient declined  Increase lisinopril to 20mg  1 tablet daily    Patient Instructions (the written plan) were given to the patient.   Cherre Robins, [facility]   04/07/2014

## 2014-05-07 ENCOUNTER — Ambulatory Visit (INDEPENDENT_AMBULATORY_CARE_PROVIDER_SITE_OTHER): Payer: Medicare Other | Admitting: Urology

## 2014-05-07 DIAGNOSIS — N3946 Mixed incontinence: Secondary | ICD-10-CM

## 2014-06-25 ENCOUNTER — Other Ambulatory Visit: Payer: Self-pay | Admitting: Nurse Practitioner

## 2014-06-28 ENCOUNTER — Ambulatory Visit (INDEPENDENT_AMBULATORY_CARE_PROVIDER_SITE_OTHER): Payer: Medicare Other

## 2014-06-28 ENCOUNTER — Ambulatory Visit (INDEPENDENT_AMBULATORY_CARE_PROVIDER_SITE_OTHER): Payer: Medicare Other | Admitting: Nurse Practitioner

## 2014-06-28 ENCOUNTER — Encounter: Payer: Self-pay | Admitting: Nurse Practitioner

## 2014-06-28 VITALS — BP 134/80 | HR 87 | Temp 99.5°F | Ht 62.0 in | Wt 236.8 lb

## 2014-06-28 DIAGNOSIS — F329 Major depressive disorder, single episode, unspecified: Secondary | ICD-10-CM

## 2014-06-28 DIAGNOSIS — N3281 Overactive bladder: Secondary | ICD-10-CM | POA: Diagnosis not present

## 2014-06-28 DIAGNOSIS — K219 Gastro-esophageal reflux disease without esophagitis: Secondary | ICD-10-CM

## 2014-06-28 DIAGNOSIS — Z720 Tobacco use: Secondary | ICD-10-CM

## 2014-06-28 DIAGNOSIS — Z87891 Personal history of nicotine dependence: Secondary | ICD-10-CM

## 2014-06-28 DIAGNOSIS — E785 Hyperlipidemia, unspecified: Secondary | ICD-10-CM | POA: Diagnosis not present

## 2014-06-28 DIAGNOSIS — Z1212 Encounter for screening for malignant neoplasm of rectum: Secondary | ICD-10-CM

## 2014-06-28 DIAGNOSIS — F32A Depression, unspecified: Secondary | ICD-10-CM

## 2014-06-28 DIAGNOSIS — I10 Essential (primary) hypertension: Secondary | ICD-10-CM | POA: Diagnosis not present

## 2014-06-28 MED ORDER — SOLIFENACIN SUCCINATE 10 MG PO TABS: 10.0000 mg | ORAL_TABLET | Freq: Every day | ORAL | Status: DC

## 2014-06-28 NOTE — Addendum Note (Signed)
Addended by: Selmer Dominion on: 06/28/2014 01:50 PM   Modules accepted: Orders

## 2014-06-28 NOTE — Telephone Encounter (Signed)
Patient NTBS for follow up and lab work  

## 2014-06-28 NOTE — Progress Notes (Signed)
Subjective:    Patient ID: Tracie Wiggins, female    DOB: 1945/09/14, 69 y.o.   MRN: [id number]  Pateint here today for follow up of chronic medical problems-   Hypertension This is a chronic problem. The current episode started more than 1 year ago. The problem has been waxing and waning since onset. The problem is controlled. Pertinent negatives include no chest pain, headaches, neck pain, palpitations or shortness of breath. Risk factors for coronary artery disease include dyslipidemia, obesity and post-menopausal state. Past treatments include ACE inhibitors. The current treatment provides moderate improvement. Compliance problems include diet and exercise.   Hyperlipidemia This is a chronic problem. The current episode started more than 1 year ago. The problem is controlled. Recent lipid tests were reviewed and are variable. Pertinent negatives include no chest pain, myalgias or shortness of breath. Current antihyperlipidemic treatment includes statins. The current treatment provides moderate improvement of lipids. Compliance problems include adherence to diet and adherence to exercise.  Risk factors for coronary artery disease include dyslipidemia, hypertension, post-menopausal and obesity.  GERD Omeprazole daily keeps symptoms  sunder control. Patient likes nexium better Depression Working well- helps to keep her relaxed. Osteoporosis prolia- no c/o side effects Over active bladder vesicare daily - no c/o side effects       Review of Systems  Constitutional: Negative for fever and chills.  HENT: Positive for congestion, postnasal drip and sinus pressure.   Eyes: Negative.   Respiratory: Negative.  Negative for shortness of breath.   Cardiovascular: Negative for chest pain and palpitations.  Gastrointestinal: Negative.   Genitourinary: Negative.   Musculoskeletal: Negative.  Negative for myalgias and neck pain.  Neurological: Negative for headaches.  Psychiatric/Behavioral:  Negative.   All other systems reviewed and are negative.      Objective:   Physical Exam  Constitutional: She is oriented to person, place, and time. She appears well-developed and well-nourished.  HENT:  Nose: Nose normal.  Mouth/Throat: Oropharynx is clear and moist.  Eyes: EOM are normal.  Neck: Trachea normal, normal range of motion and full passive range of motion without pain. Neck supple. No JVD present. Carotid bruit is not present. No thyromegaly present.  Cardiovascular: Normal rate, regular rhythm, normal heart sounds and intact distal pulses.  Exam reveals no gallop and no friction rub.   No murmur heard. Pulmonary/Chest: Effort normal and breath sounds normal.  Abdominal: Soft. Bowel sounds are normal. She exhibits no distension and no mass. There is no tenderness.  Musculoskeletal: Normal range of motion. She exhibits edema (mild edema bil lower ext).  Lymphadenopathy:    She has no cervical adenopathy.  Neurological: She is alert and oriented to person, place, and time. She has normal reflexes.  Skin: Skin is warm and dry.  Psychiatric: She has a normal mood and affect. Her behavior is normal. Judgment and thought content normal.   BP 134/80 mmHg  Pulse 87  Temp(Src) 99.5 F (37.5 C) (Oral)  Ht 5' 2" (1.575 m)  Wt 236 lb 12.8 oz (107.412 kg)  BMI 43.30 kg/m2  Chest x ray- no acute or chronic findings-Preliminary reading by Ronnald Collum, FNP  [facility]   EKG- Kerry Hough, FNP       Assessment & Plan:  1. Essential hypertension Do not add salt to diet - CMP14+EGFR - EKG 12-Lead  2. Gastroesophageal reflux disease without esophagitis Avoid spicy foods Do not eat 2 hours prior to bedtime   3. Hyperlipidemia Low fat diet - Lipid panel  4. Depression Stress management  5. OAB (overactive bladder) - solifenacin (VESICARE) 10 MG tablet; Take 1 tablet (10 mg total) by mouth daily.  Dispense: 30 tablet; Refill: 5  6. Smoking hx - DG Chest 2  View; Future  7. Screening for malignant neoplasm of the rectum - Fecal occult blood, imunochemical; Future    Labs pending Health maintenance reviewed Diet and exercise encouraged Continue all meds Follow up  In 3 months   [place], FNP

## 2014-06-28 NOTE — Telephone Encounter (Signed)
Last seen by provider MMM 12/28/13 and last lipid 12/28/13  Requesting 90 day supply

## 2014-06-28 NOTE — Patient Instructions (Signed)
Exercise to Stay Healthy Exercise helps you become and stay healthy. EXERCISE IDEAS AND TIPS Choose exercises that:  You enjoy.  Fit into your day. You do not need to exercise really hard to be healthy. You can do exercises at a slow or medium level and stay healthy. You can:  Stretch before and after working out.  Try yoga, Pilates, or tai chi.  Lift weights.  Walk fast, swim, jog, run, climb stairs, bicycle, dance, or rollerskate.  Take aerobic classes. Exercises that burn about 150 calories:  Running 1  miles in 15 minutes.  Playing volleyball for 45 to 60 minutes.  Washing and waxing a car for 45 to 60 minutes.  Playing touch football for 45 minutes.  Walking 1  miles in 35 minutes.  Pushing a stroller 1  miles in 30 minutes.  Playing basketball for 30 minutes.  Raking leaves for 30 minutes.  Bicycling 5 miles in 30 minutes.  Walking 2 miles in 30 minutes.  Dancing for 30 minutes.  Shoveling snow for 15 minutes.  Swimming laps for 20 minutes.  Walking up stairs for 15 minutes.  Bicycling 4 miles in 15 minutes.  Gardening for 30 to 45 minutes.  Jumping rope for 15 minutes.  Washing windows or floors for 45 to 60 minutes. Document Released: 01/20/2010 Document Revised: 03/12/2011 Document Reviewed: 01/20/2010 ExitCare Patient Information 2015 [employer]. This information is not intended to replace advice given to you by your health care provider. Make sure you discuss any questions you have with your health care provider.  

## 2014-06-29 LAB — CMP14+EGFR
ALT: 17 IU/L (ref 0–32)
AST: 15 IU/L (ref 0–40)
Albumin/Globulin Ratio: 1.7 (ref 1.1–2.5)
Albumin: 4.1 g/dL (ref 3.5–4.8)
Alkaline Phosphatase: 89 IU/L (ref 39–117)
BUN/Creatinine Ratio: 10 — ABNORMAL LOW (ref 11–26)
BUN: 9 mg/dL (ref 8–27)
Bilirubin Total: 0.4 mg/dL (ref 0.0–1.2)
CO2: 26 mmol/L (ref 18–29)
Calcium: 9.5 mg/dL (ref 8.7–10.3)
Chloride: 100 mmol/L (ref 97–108)
Creatinine, Ser: 0.91 mg/dL (ref 0.57–1.00)
GFR calc Af Amer: 74 mL/min/{1.73_m2} (ref >59)
GFR calc non Af Amer: 64 mL/min/{1.73_m2} (ref >59)
Globulin, Total: 2.4 g/dL (ref 1.5–4.5)
Glucose: 86 mg/dL (ref 65–99)
Potassium: 4.5 mmol/L (ref 3.5–5.2)
Sodium: 141 mmol/L (ref 134–144)
Total Protein: 6.5 g/dL (ref 6.0–8.5)

## 2014-06-29 LAB — LIPID PANEL
Chol/HDL Ratio: 3 ratio units (ref 0.0–4.4)
Cholesterol, Total: 176 mg/dL (ref 100–199)
HDL: 58 mg/dL (ref >39)
LDL Calculated: 85 mg/dL (ref 0–99)
Triglycerides: 164 mg/dL — ABNORMAL HIGH (ref 0–149)
VLDL Cholesterol Cal: 33 mg/dL (ref 5–40)

## 2014-06-30 LAB — FECAL OCCULT BLOOD, IMMUNOCHEMICAL: Fecal Occult Bld: NEGATIVE

## 2014-08-05 ENCOUNTER — Other Ambulatory Visit: Payer: Self-pay | Admitting: Pharmacist

## 2014-08-05 DIAGNOSIS — M810 Age-related osteoporosis without current pathological fracture: Secondary | ICD-10-CM

## 2014-09-20 ENCOUNTER — Telehealth: Payer: Self-pay | Admitting: Nurse Practitioner

## 2014-09-20 DIAGNOSIS — Z87898 Personal history of other specified conditions: Secondary | ICD-10-CM

## 2014-09-20 NOTE — Telephone Encounter (Signed)
Need to know if she has a mass or something- so can order routine or diagnostic

## 2014-09-20 NOTE — Telephone Encounter (Signed)
This is a follow up to a right breast biopsy done 03/2014. They suggested a mammogram in 6 months and possible ultrasound. Diagnostic or screening wasn't specified.

## 2014-09-27 ENCOUNTER — Other Ambulatory Visit: Payer: Self-pay | Admitting: Nurse Practitioner

## 2014-10-04 ENCOUNTER — Encounter: Payer: Self-pay | Admitting: Nurse Practitioner

## 2014-10-04 ENCOUNTER — Ambulatory Visit (INDEPENDENT_AMBULATORY_CARE_PROVIDER_SITE_OTHER): Payer: Medicare Other | Admitting: Nurse Practitioner

## 2014-10-04 VITALS — BP 134/78 | HR 78 | Temp 97.8°F | Ht 62.0 in | Wt 238.0 lb

## 2014-10-04 DIAGNOSIS — M810 Age-related osteoporosis without current pathological fracture: Secondary | ICD-10-CM

## 2014-10-04 DIAGNOSIS — N3945 Continuous leakage: Secondary | ICD-10-CM | POA: Diagnosis not present

## 2014-10-04 DIAGNOSIS — E785 Hyperlipidemia, unspecified: Secondary | ICD-10-CM

## 2014-10-04 DIAGNOSIS — N3281 Overactive bladder: Secondary | ICD-10-CM | POA: Diagnosis not present

## 2014-10-04 DIAGNOSIS — F329 Major depressive disorder, single episode, unspecified: Secondary | ICD-10-CM | POA: Diagnosis not present

## 2014-10-04 DIAGNOSIS — F32A Depression, unspecified: Secondary | ICD-10-CM

## 2014-10-04 DIAGNOSIS — K219 Gastro-esophageal reflux disease without esophagitis: Secondary | ICD-10-CM | POA: Diagnosis not present

## 2014-10-04 DIAGNOSIS — I10 Essential (primary) hypertension: Secondary | ICD-10-CM | POA: Diagnosis not present

## 2014-10-04 DIAGNOSIS — Z23 Encounter for immunization: Secondary | ICD-10-CM | POA: Diagnosis not present

## 2014-10-04 MED ORDER — ATORVASTATIN CALCIUM 10 MG PO TABS: 10.0000 mg | ORAL_TABLET | Freq: Every day | ORAL | Status: DC

## 2014-10-04 MED ORDER — OMEPRAZOLE 40 MG PO CPDR: DELAYED_RELEASE_CAPSULE | ORAL | Status: DC

## 2014-10-04 MED ORDER — DEPEND SILHOUETTE BRIEFS L/XL MISC: 1.0000 | Freq: Three times a day (TID) | Status: DC

## 2014-10-04 MED ORDER — POISE HOURGLASS SHAPE PADS: 1.0000 | MEDICATED_PAD | Freq: Three times a day (TID) | Status: DC

## 2014-10-04 MED ORDER — SOLIFENACIN SUCCINATE 10 MG PO TABS: 10.0000 mg | ORAL_TABLET | Freq: Every day | ORAL | Status: DC

## 2014-10-04 NOTE — Progress Notes (Signed)
Subjective:    Patient ID: Tracie Wiggins, female    DOB: 04-11-1945, 69 y.o.   MRN: [id number]  Pateint here today for follow up of chronic medical problems-   Hypertension This is a chronic problem. The current episode started more than 1 year ago. The problem has been waxing and waning since onset. The problem is controlled. Pertinent negatives include no chest pain, headaches, neck pain, palpitations or shortness of breath. Risk factors for coronary artery disease include dyslipidemia, obesity and post-menopausal state. Past treatments include ACE inhibitors. The current treatment provides moderate improvement. Compliance problems include diet and exercise.   Hyperlipidemia This is a chronic problem. The current episode started more than 1 year ago. The problem is controlled. Recent lipid tests were reviewed and are variable. Pertinent negatives include no chest pain, myalgias or shortness of breath. Current antihyperlipidemic treatment includes statins. The current treatment provides moderate improvement of lipids. Compliance problems include adherence to diet and adherence to exercise.  Risk factors for coronary artery disease include dyslipidemia, hypertension, post-menopausal and obesity.  GERD Omeprazole daily keeps symptoms  sunder control. Patient likes nexium better Depression Working well- helps to keep her relaxed. Osteoporosis prolia- no c/o side effects Over active bladder vesicare daily - no c/o side effects    Review of Systems  Constitutional: Negative for fever and chills.  HENT: Negative.   Eyes: Negative.   Respiratory: Negative.  Negative for shortness of breath.   Cardiovascular: Negative for chest pain and palpitations.  Gastrointestinal: Negative.   Genitourinary: Negative.   Musculoskeletal: Negative.  Negative for myalgias and neck pain.  Neurological: Negative for headaches.  Psychiatric/Behavioral: Negative.   All other systems reviewed and are  negative.      Objective:   Physical Exam  Constitutional: She is oriented to person, place, and time. She appears well-developed and well-nourished.  HENT:  Nose: Nose normal.  Mouth/Throat: Oropharynx is clear and moist.  Eyes: EOM are normal.  Neck: Trachea normal, normal range of motion and full passive range of motion without pain. Neck supple. No JVD present. Carotid bruit is not present. No thyromegaly present.  Cardiovascular: Normal rate, regular rhythm, normal heart sounds and intact distal pulses.  Exam reveals no gallop and no friction rub.   No murmur heard. Pulmonary/Chest: Effort normal and breath sounds normal.  Abdominal: Soft. Bowel sounds are normal. She exhibits no distension and no mass. There is no tenderness.  Musculoskeletal: Normal range of motion. She exhibits edema (mild edema bil lower ext).  Lymphadenopathy:    She has no cervical adenopathy.  Neurological: She is alert and oriented to person, place, and time. She has normal reflexes.  Skin: Skin is warm and dry.  Psychiatric: She has a normal mood and affect. Her behavior is normal. Judgment and thought content normal.    BP 134/78 mmHg  Pulse 78  Temp(Src) 97.8 F (36.6 C) (Oral)  Ht '5\' 2"'  (1.575 m)  Wt 238 lb (107.956 kg)  BMI 43.52 kg/m2      Assessment & Plan:  1. Essential hypertension Do not add salt to diet - CMP14+EGFR  2. Gastroesophageal reflux disease without esophagitis Avoid spicy foods Do not eat 2 hours prior to bedtime  - omeprazole (PRILOSEC) 40 MG capsule; TAKE (1) CAPSULE DAILY  Dispense: 90 capsule; Refill: 1  3. OAB (overactive bladder) - solifenacin (VESICARE) 10 MG tablet; Take 1 tablet (10 mg total) by mouth daily.  Dispense: 30 tablet; Refill: 5  4. Hyperlipidemia Low fta  diet - Lipid panel - atorvastatin (LIPITOR) 10 MG tablet; Take 1 tablet (10 mg total) by mouth daily.  Dispense: 90 tablet; Refill: 1  5. Depression Stress amanagement  6.  Osteoporosis Weight bearing exercises encouraged  7. Morbid obesity, unspecified obesity type ([place]) Discussed diet  8. Continuous leakage of urine - Incontinence Supply Disposable (POISE HOURGLASS SHAPE) PADS; 1 each by Does not apply route 4 (four) times daily - after meals and at bedtime.  Dispense: 120 each; Refill: 11 - Incontinence Supply Disposable (DEPEND SILHOUETTE BRIEFS L/XL) MISC; 1 each by Does not apply route 3 (three) times daily.  Dispense: 120 each; Refill: 11    Labs pending Health maintenance reviewed Diet and exercise encouraged Continue all meds Follow up  In 3 month   Oneida Castle, FNP

## 2014-10-04 NOTE — Patient Instructions (Signed)
Fat and Cholesterol Control Diet Fat and cholesterol levels in your blood and organs are influenced by your diet. High levels of fat and cholesterol may lead to diseases of the heart, small and large blood vessels, gallbladder, liver, and pancreas. CONTROLLING FAT AND CHOLESTEROL WITH DIET Although exercise and lifestyle factors are important, your diet is key. That is because certain foods are known to raise cholesterol and others to lower it. The goal is to balance foods for their effect on cholesterol and more importantly, to replace saturated and trans fat with other types of fat, such as monounsaturated fat, polyunsaturated fat, and omega-3 fatty acids. On average, a person should consume no more than 15 to 17 g of saturated fat daily. Saturated and trans fats are considered "bad" fats, and they will raise LDL cholesterol. Saturated fats are primarily found in animal products such as meats, butter, and cream. However, that does not mean you need to give up all your favorite foods. Today, there are good tasting, low-fat, low-cholesterol substitutes for most of the things you like to eat. Choose low-fat or nonfat alternatives. Choose round or loin cuts of red meat. These types of cuts are lowest in fat and cholesterol. Chicken (without the skin), fish, veal, and ground turkey breast are great choices. Eliminate fatty meats, such as hot dogs and salami. Even shellfish have little or no saturated fat. Have a 3 oz (85 g) portion when you eat lean meat, poultry, or fish. Trans fats are also called "partially hydrogenated oils." They are oils that have been scientifically manipulated so that they are solid at room temperature resulting in a longer shelf life and improved taste and texture of foods in which they are added. Trans fats are found in stick margarine, some tub margarines, cookies, crackers, and baked goods.  When baking and cooking, oils are a great substitute for butter. The monounsaturated oils are  especially beneficial since it is believed they lower LDL and raise HDL. The oils you should avoid entirely are saturated tropical oils, such as coconut and palm.  Remember to eat a lot from food groups that are naturally free of saturated and trans fat, including fish, fruit, vegetables, beans, grains (barley, rice, couscous, bulgur wheat), and pasta (without cream sauces).  IDENTIFYING FOODS THAT LOWER FAT AND CHOLESTEROL  Soluble fiber may lower your cholesterol. This type of fiber is found in fruits such as apples, vegetables such as broccoli, potatoes, and carrots, legumes such as beans, peas, and lentils, and grains such as barley. Foods fortified with plant sterols (phytosterol) may also lower cholesterol. You should eat at least 2 g per day of these foods for a cholesterol lowering effect.  Read package labels to identify low-saturated fats, trans fat free, and low-fat foods at the supermarket. Select cheeses that have only 2 to 3 g saturated fat per ounce. Use a heart-healthy tub margarine that is free of trans fats or partially hydrogenated oil. When buying baked goods (cookies, crackers), avoid partially hydrogenated oils. Breads and muffins should be made from whole grains (whole-wheat or whole oat flour, instead of "flour" or "enriched flour"). Buy non-creamy canned soups with reduced salt and no added fats.  FOOD PREPARATION TECHNIQUES  Never deep-fry. If you must fry, either stir-fry, which uses very little fat, or use non-stick cooking sprays. When possible, broil, bake, or roast meats, and steam vegetables. Instead of putting butter or margarine on vegetables, use lemon and herbs, applesauce, and cinnamon (for squash and sweet potatoes). Use nonfat   yogurt, salsa, and low-fat dressings for salads.  LOW-SATURATED FAT / LOW-FAT FOOD SUBSTITUTES Meats / Saturated Fat (g)  Avoid: Steak, marbled (3 oz/85 g) / 11 g  Choose: Steak, lean (3 oz/85 g) / 4 g  Avoid: Hamburger (3 oz/85 g) / 7  g  Choose: Hamburger, lean (3 oz/85 g) / 5 g  Avoid: Ham (3 oz/85 g) / 6 g  Choose: Ham, lean cut (3 oz/85 g) / 2.4 g  Avoid: Chicken, with skin, dark meat (3 oz/85 g) / 4 g  Choose: Chicken, skin removed, dark meat (3 oz/85 g) / 2 g  Avoid: Chicken, with skin, light meat (3 oz/85 g) / 2.5 g  Choose: Chicken, skin removed, light meat (3 oz/85 g) / 1 g Dairy / Saturated Fat (g)  Avoid: Whole milk (1 cup) / 5 g  Choose: Low-fat milk, 2% (1 cup) / 3 g  Choose: Low-fat milk, 1% (1 cup) / 1.5 g  Choose: Skim milk (1 cup) / 0.3 g  Avoid: Hard cheese (1 oz/28 g) / 6 g  Choose: Skim milk cheese (1 oz/28 g) / 2 to 3 g  Avoid: Cottage cheese, 4% fat (1 cup) / 6.5 g  Choose: Low-fat cottage cheese, 1% fat (1 cup) / 1.5 g  Avoid: Ice cream (1 cup) / 9 g  Choose: Sherbet (1 cup) / 2.5 g  Choose: Nonfat frozen yogurt (1 cup) / 0.3 g  Choose: Frozen fruit bar / trace  Avoid: Whipped cream (1 tbs) / 3.5 g  Choose: Nondairy whipped topping (1 tbs) / 1 g Condiments / Saturated Fat (g)  Avoid: Mayonnaise (1 tbs) / 2 g  Choose: Low-fat mayonnaise (1 tbs) / 1 g  Avoid: Butter (1 tbs) / 7 g  Choose: Extra light margarine (1 tbs) / 1 g  Avoid: Coconut oil (1 tbs) / 11.8 g  Choose: Olive oil (1 tbs) / 1.8 g  Choose: Corn oil (1 tbs) / 1.7 g  Choose: Safflower oil (1 tbs) / 1.2 g  Choose: Sunflower oil (1 tbs) / 1.4 g  Choose: Soybean oil (1 tbs) / 2.4 g  Choose: Canola oil (1 tbs) / 1 g Document Released: 12/18/2004 Document Revised: 04/14/2012 Document Reviewed: 03/18/2013 Exit[employer] Patient Information 2015 [employer]. This information is not intended to replace advice given to you by your health care provider. Make sure you discuss any questions you have with your health care provider.  

## 2014-10-05 ENCOUNTER — Other Ambulatory Visit: Payer: Self-pay | Admitting: Nurse Practitioner

## 2014-10-05 ENCOUNTER — Ambulatory Visit (HOSPITAL_COMMUNITY)
Admission: RE | Admit: 2014-10-05 | Discharge: 2014-10-05 | Disposition: A | Discharge status: Home / Self Care | Payer: Medicare Other | Source: Ambulatory Visit | Attending: Nurse Practitioner | Admitting: Nurse Practitioner

## 2014-10-05 DIAGNOSIS — Z87898 Personal history of other specified conditions: Secondary | ICD-10-CM

## 2014-10-05 DIAGNOSIS — N63 Unspecified lump in breast: Secondary | ICD-10-CM | POA: Diagnosis present

## 2014-10-05 LAB — CMP14+EGFR
ALT: 17 IU/L (ref 0–32)
AST: 14 IU/L (ref 0–40)
Albumin/Globulin Ratio: 1.8 (ref 1.1–2.5)
Albumin: 4.1 g/dL (ref 3.5–4.8)
Alkaline Phosphatase: 89 IU/L (ref 39–117)
BUN/Creatinine Ratio: 10 — ABNORMAL LOW (ref 11–26)
BUN: 9 mg/dL (ref 8–27)
Bilirubin Total: 0.4 mg/dL (ref 0.0–1.2)
CO2: 23 mmol/L (ref 18–29)
Calcium: 10 mg/dL (ref 8.7–10.3)
Chloride: 104 mmol/L (ref 97–108)
Creatinine, Ser: 0.91 mg/dL (ref 0.57–1.00)
GFR calc Af Amer: 74 mL/min/{1.73_m2} (ref >59)
GFR calc non Af Amer: 64 mL/min/{1.73_m2} (ref >59)
Globulin, Total: 2.3 g/dL (ref 1.5–4.5)
Glucose: 86 mg/dL (ref 65–99)
Potassium: 4.5 mmol/L (ref 3.5–5.2)
Sodium: 144 mmol/L (ref 134–144)
Total Protein: 6.4 g/dL (ref 6.0–8.5)

## 2014-10-05 LAB — LIPID PANEL
Chol/HDL Ratio: 2.9 ratio units (ref 0.0–4.4)
Cholesterol, Total: 173 mg/dL (ref 100–199)
HDL: 60 mg/dL (ref >39)
LDL Calculated: 80 mg/dL (ref 0–99)
Triglycerides: 167 mg/dL — ABNORMAL HIGH (ref 0–149)
VLDL Cholesterol Cal: 33 mg/dL (ref 5–40)

## 2014-10-06 ENCOUNTER — Encounter: Payer: Self-pay | Admitting: *Deleted

## 2014-10-14 ENCOUNTER — Ambulatory Visit (INDEPENDENT_AMBULATORY_CARE_PROVIDER_SITE_OTHER): Payer: Medicare Other | Admitting: Pharmacist

## 2014-10-14 DIAGNOSIS — M810 Age-related osteoporosis without current pathological fracture: Secondary | ICD-10-CM | POA: Diagnosis not present

## 2014-10-14 MED ORDER — DENOSUMAB 60 MG/ML ~~LOC~~ SOLN
60.0000 mg | Freq: Once | SUBCUTANEOUS | Status: AC
  Administered 2014-10-14: 60 mg via SUBCUTANEOUS

## 2014-10-14 NOTE — Progress Notes (Signed)
Patient ID: Tracie Wiggins, female   DOB: 05/19/1945, 69 y.o.   MRN: [id number]  [facility] - here for prolia injection  HPI: Tracie Wiggins has osteoporosis diagnosed in 2012.  She has been injecting Prolia 60mg  SQ every 6 months since her first injection 08/27/2011.  Her last Prolia injection was 04/2014.  Next injection due today  Back Pain?  No       Kyphosis?  No Prior fracture?  Yes - femur                                                             PMH: Age at menopause:  69 yo Hysterectomy?  Yes Oophorectomy?  Yes HRT? Yes - Former.  Type/duration: premarin Steroid Use?  No Thyroid med?  No History of cancer?  No History of digestive disorders (ie Crohn's)?  Yes - GERD Current or previous eating disorders?  No Last Vitamin D Result:   Last GFR Result:     FH/SH: Family history of osteoporosis?  No Parent with history of hip fracture?  No Family history of breast cancer?  No Exercise?  Yes - walking some but not much Smoking?  No Alcohol?  No    Calcium Assessment Calcium Intake  # of servings/day  Calcium mg  Milk (8 oz) 1  x  300  = 300mg   Yogurt (4 oz) 0 x  200 = 0  Cheese (1 oz) 0 x  200 = 0  Other Calcium sources MVI  400mg   Ca supplement  =    Estimated calcium intake per day 700mg     DEXA Results Date of Test T-Score for AP Spine L1-L4 T-Score for Total Left Hip T-Score for Total Right Hip  11/19/2012 -0.6 -1.3 -1.4  11/15/2010 -1.4 -1.6 -1.8  09/16/2006 -1.6 -1.5 -1.8  11/23/1999 -1.5 -1.2 -1.8  ** T-Score for left neck of femur 11/15/2010 was -2.5**  Assessment: Osteoporosis with increase in BMD since starting Prolia  Recommendations: 1.  Continue Prolia 60mg  SQ q 6 months - injection given today.  Next will be due 04/2014 2.  Increase calcium 1200mg  daily through supplementation or diet.  3.  recommend weight bearing exercise - 30 minutes at least 4 days per week.   4.  Counseled and educated about fall risk and prevention.  Recheck  DEXA:  11/2014 - ORDERED TODAY  Time spent counseling patient:  20 minutes  Cherre Robins, PharmD, CPP

## 2014-10-14 NOTE — Patient Instructions (Signed)
Calcium & Vitamin D: The Facts  Why is calcium and vitamin D consumption important? Calcium: . Most Americans do not consume adequate amounts of calcium! Calcium is required for proper muscle function, nerve communication, bone support, and many other functions in the body.  . The body uses bones as a source of calcium. Bones 'remodel' themselves continuously - the body constantly breaks bone down to release calcium and rebuilds bones by replacing calcium in the bone later.  . As we get older, the rate of bone breakdown occurs faster than bone rebuilding which could lead to osteopenia, osteoporosis, and possible fractures.   Vitamin D: . People naturally make vitamin D in the body when sunlight hits the skin and triggers a process that leads to vitamin D production. This natural vitamin D production requires about 10-15 minutes of sun exposure on the hands, arms, and face at least 2-3 times per week. However, due to decreased sun exposure and the use of sunscreen, most people will need to get additional vitamin D from foods or supplements. Your doctor can measure your body's vitamin D level through a simple blood test to determine your daily vitamin D needs.  . Vitamin D is used to help the body absorb calcium, maintain bone health, help the immune system, and reduce inflammation. It also plays a role in muscle performance, balance and risk of falling.  . Vitamin D deficiency can lead to osteomalacia or softening of the bones, bone pain, and muscle weakness.   The recommended daily allowance of Calcium and Vitamin D varies for different age groups. Age group Calcium (mg) Vitamin D (IU)  Females and Males: Age 55-50 1000 mg 600 IU  Females: Age 28- 86 1200 mg 600 IU  Males: Age 30-70 1000 mg 600 IU  Females and Males: Age 54+ 1200 mg 800 IU  Pregnant/lactating Females age 82-50 1000 mg 600 IU   How much Calcium do you get in your diet? Calcium Intake # of servings per day  Total calcium (mg)   Skim milk, 2% milk (1 cup) _________ x 300 mg   Yogurt (1 small container) _________ x 200 mg   Cheese (1oz) _________ x 200 mg   Cottage Cheese (1 cup)             ________ x 150 mg   Almond milk (1 cup) _________ x 450 mg   Fortified Orange Juice (1 cup) _________ x 300 mg   Broccoli or spinach ( 1 cup) _________ x 100 mg   Salmon (3 oz) _________ x 150 mg    Almonds (1/4 cup) _______ x 90 mg      How do we get Calcium and Vitamin D in our diet? Calcium: . Obtaining calcium from the diet is the most preferred way to reach the recommended daily goal. If this goal is not reached through diet, calcium supplements are available.  . Calcium is found in many foods including: dairy products, dark leafy vegetables (like broccoli, kale, and spinach), fish, and fortified products like juices and cereals.  . The food label will have a %DV (percent daily value) listed showing the amount of calcium per serving. To determine the total mg per serving, simply replace the % with zero (0).  For example, Almond Breeze almond milk contains 45% DV of calcium or 4107m per 1 cup.  . You can increase the amount of calcium in your diet by using more calcium products in your daily meals. Use yogurt and fruit to  make smoothies or use yogurt to top baked potatoes or make whipped potatoes. Sprinkle low fat cheese onto salads or into egg white omelets. You can even add non-fat dry milk powder ($RemoveBefo'300mg'[id number]$  calcium per 1/3 cup) to hot cereals, meat loaf, soups, or potatoes.  . Calcium supplements come in many forms including tablets, chewables, and gummies. Be sure to read the label to determine the correct number of tablets per serving and whether or not to take the supplement with food.  . Calcium carbonate products (Oscal, Caltrate, and Viactiv) are generally better absorbed when taken with food while calcium citrate products like Citracal can be taken with or without food.  . The body can only absorb about 600 mg of calcium  at one time. It is recommended to take calcium supplements in small amounts several times per day.  However, taking it all at once is better than not taking it at all. . Increasing your intake of calcium is essential for bone health, but may also lead to some side effects like constipation, increased gas, bloating or abdominal cramping. To help reduce these side effects, start with 1 tablet per day and slowly increase your intake of the supplement to the recommended doses. It is also recommended that you drink plenty of water each day. Vitamin D: . Very few foods naturally contain vitamin D. However, it is found in saltwater fish (like tuna, salmon and mackerel), beef liver, egg yolks, cheese and vitamin D fortified foods (like yogurt, cereals, orange juice and milk) . The amount of vitamin D in each food or product is listed as %DV on the product label. To determine the total amount of vitamin D per serving, drop the % sign and multiply the number by 4. For example, 1 cup of Almond Breeze almond milk contains 25% DV vitamin D or 100 IU per serving (25 x 4 =100). . Vitamin D is also found in multivitamins and supplements and may be listed as ergocalciferol (vitamin D2) or cholecalciferol (vitamin D3). Each of these forms of vitamin D are equivalent and the daily recommended intake will vary based on your age and the vitamin D levels in your body. Follow your doctor's recommendation for vitamin D intake.       Fall Prevention in the Home  Falls can cause injuries and can affect people from all age groups. There are many simple things that you can do to make your home safe and to help prevent falls. WHAT CAN I DO ON THE OUTSIDE OF MY HOME?  Regularly repair the edges of walkways and driveways and fix any cracks.  Remove high doorway thresholds.  Trim any shrubbery on the main path into your home.  Use bright outdoor lighting.  Clear walkways of debris and clutter, including tools and  rocks.  Regularly check that handrails are securely fastened and in good repair. Both sides of any steps should have handrails.  Install guardrails along the edges of any raised decks or porches.  Have leaves, snow, and ice cleared regularly.  Use sand or salt on walkways during winter months.  In the garage, clean up any spills right away, including grease or oil spills. WHAT CAN I DO IN THE BATHROOM?  Use night lights.  Install grab bars by the toilet and in the tub and shower. Do not use towel bars as grab bars.  Use non-skid mats or decals on the floor of the tub or shower.  If you need to sit down while you are  in the shower, use a plastic, non-slip stool.[name]  Keep the floor dry. Immediately clean up any water that spills on the floor.  Remove soap buildup in the tub or shower on a regular basis.  Attach bath mats securely with double-sided non-slip rug tape.  Remove throw rugs and other tripping hazards from the floor. WHAT CAN I DO IN THE BEDROOM?  Use night lights.  Make sure that a bedside light is easy to reach.  Do not use oversized bedding that drapes onto the floor.  Have a firm chair that has side arms to use for getting dressed.  Remove throw rugs and other tripping hazards from the floor. WHAT CAN I DO IN THE KITCHEN?   Clean up any spills right away.  Avoid walking on wet floors.  Place frequently used items in easy-to-reach places.  If you need to reach for something above you, use a sturdy step stool that has a grab bar.  Keep electrical cables out of the way.  Do not use floor polish or wax that makes floors slippery. If you have to use wax, make sure that it is non-skid floor wax.  Remove throw rugs and other tripping hazards from the floor. WHAT CAN I DO IN THE STAIRWAYS?  Do not leave any items on the stairs.  Make sure that there are handrails on both sides of the stairs. Fix handrails that are broken or loose. Make sure that handrails  are as long as the stairways.  Check any carpeting to make sure that it is firmly attached to the stairs. Fix any carpet that is loose or worn.  Avoid having throw rugs at the top or bottom of stairways, or secure the rugs with carpet tape to prevent them from moving.  Make sure that you have a light switch at the top of the stairs and the bottom of the stairs. If you do not have them, have them installed. WHAT ARE SOME OTHER FALL PREVENTION TIPS?  Wear closed-toe shoes that fit well and support your feet. Wear shoes that have rubber soles or low heels.  When you use a stepladder, make sure that it is completely opened and that the sides are firmly locked. Have someone hold the ladder while you are using it. Do not climb a closed stepladder.  Add color or contrast paint or tape to grab bars and handrails in your home. Place contrasting color strips on the first and last steps.  Use mobility aids as needed, such as canes, walkers, scooters, and crutches.  Turn on lights if it is dark. Replace any light bulbs that burn out.  Set up furniture so that there are clear paths. Keep the furniture in the same spot.  Fix any uneven floor surfaces.  Choose a carpet design that does not hide the edge of steps of a stairway.  Be aware of any and all pets.  Review your medicines with your healthcare provider. Some medicines can cause dizziness or changes in blood pressure, which increase your risk of falling. Talk with your health care provider about other ways that you can decrease your risk of falls. This may include working with a physical therapist or trainer to improve your strength, balance, and endurance.   This information is not intended to replace advice given to you by your health care provider. Make sure you discuss any questions you have with your health care provider.   Document Released: 12/08/2001 Document Revised: 05/04/2014 Document Reviewed: 01/22/2014 [employer]vier Interactive  Patient Education 2016 [employer].

## 2014-11-12 ENCOUNTER — Ambulatory Visit (INDEPENDENT_AMBULATORY_CARE_PROVIDER_SITE_OTHER): Payer: Medicare Other | Admitting: Urology

## 2014-11-12 DIAGNOSIS — N3946 Mixed incontinence: Secondary | ICD-10-CM | POA: Diagnosis not present

## 2014-12-16 ENCOUNTER — Encounter: Payer: Self-pay | Admitting: Nurse Practitioner

## 2014-12-16 ENCOUNTER — Ambulatory Visit (INDEPENDENT_AMBULATORY_CARE_PROVIDER_SITE_OTHER): Payer: Medicare Other

## 2014-12-16 DIAGNOSIS — M810 Age-related osteoporosis without current pathological fracture: Secondary | ICD-10-CM | POA: Diagnosis not present

## 2014-12-20 ENCOUNTER — Other Ambulatory Visit: Payer: Self-pay | Admitting: Pharmacist

## 2015-01-05 ENCOUNTER — Encounter: Payer: Self-pay | Admitting: Nurse Practitioner

## 2015-01-05 ENCOUNTER — Ambulatory Visit (INDEPENDENT_AMBULATORY_CARE_PROVIDER_SITE_OTHER): Payer: Commercial Managed Care - HMO | Admitting: Nurse Practitioner

## 2015-01-05 VITALS — BP 154/91 | HR 86 | Temp 98.0°F | Ht 62.0 in | Wt 233.0 lb

## 2015-01-05 DIAGNOSIS — J069 Acute upper respiratory infection, unspecified: Secondary | ICD-10-CM | POA: Diagnosis not present

## 2015-01-05 DIAGNOSIS — J988 Other specified respiratory disorders: Secondary | ICD-10-CM

## 2015-01-05 LAB — POCT INFLUENZA A/B
Influenza A, POC: NEGATIVE
Influenza B, POC: NEGATIVE

## 2015-01-05 MED ORDER — AZITHROMYCIN 250 MG PO TABS: ORAL_TABLET | ORAL | Status: DC

## 2015-01-05 MED ORDER — BENZONATATE 100 MG PO CAPS: 200.0000 mg | ORAL_CAPSULE | Freq: Three times a day (TID) | ORAL | Status: DC | PRN

## 2015-01-05 NOTE — Progress Notes (Signed)
  Subjective:     Tracie Wiggins is a 70 y.o. female here for evaluation of a cough. Onset of symptoms was 4 days ago. Symptoms have been unchanged since that time. The cough is barky, harsh, hoarse and productive of yellow sputum and is aggravated by nothing. Associated symptoms include: change in voice, sputum production and wheezing. Patient does not have a history of asthma. Patient does have a history of environmental allergens. Patient has not traveled recently. Patient does not have a history of smoking. Patient has had a previous chest x-ray. Patient has not had a PPD done.  The following portions of the patient's history were reviewed and updated as appropriate: allergies, current medications, past family history, past medical history, past social history, past surgical history and problem list.  Review of Systems Pertinent items are noted in HPI.    Objective:     BP 154/91 mmHg  Pulse 86  Temp(Src) 98 F (36.7 C) (Oral)  Ht 5\' 2"  (1.575 m)  Wt 233 lb (105.688 kg)  BMI 42.61 kg/m2 General appearance: alert and cooperative Eyes: conjunctivae/corneas clear. PERRL, EOM's intact. Fundi benign. Ears: normal TM's and external ear canals both ears Nose: copious discharge, moderate congestion, turbinates red, sinus tenderness bilateral Throat: lips, mucosa, and tongue normal; teeth and gums normal Neck: no adenopathy, no carotid bruit, no JVD, supple, symmetrical, trachea midline and thyroid not enlarged, symmetric, no tenderness/mass/nodules Lungs: wheezes RUL and deep wet cough Heart: regular rate and rhythm, S1, S2 normal, no murmur, click, rub or gallop    Assessment:    URI with Post Nasal Drip    Plan:   1. Take meds as prescribed 2. Use a cool mist humidifier especially during the winter months and when heat has been humid. 3. Use saline nose sprays frequently 4. Saline irrigations of the nose can be very helpful if done frequently.  * 4X daily for 1 week*  * Use of  a nettie pot can be helpful with this. Follow directions with this* 5. Drink plenty of fluids 6. Keep thermostat turn down low 7.For any cough or congestion  Use plain Mucinex- regular strength or max strength is fine   * Children- consult with Pharmacist for dosing 8. For fever or aces or pains- take tylenol or ibuprofen appropriate for age and weight.  * for fevers greater than 101 orally you may alternate ibuprofen and tylenol every  3 hours.   Meds ordered this encounter  Medications  . azithromycin (ZITHROMAX Z-PAK) 250 MG tablet    Sig: As directed    Dispense:  1 each    Refill:  0    Order Specific Question:  Supervising Provider    Answer:  Chipper Herb [[id number]]  . benzonatate (TESSALON PERLES) 100 MG capsule    Sig: Take 2 capsules (200 mg total) by mouth 3 (three) times daily as needed for cough.    Dispense:  20 capsule    Refill:  0    Order Specific Question:  Supervising Provider    Answer:  Chipper Herb [place], FNP

## 2015-01-05 NOTE — Patient Instructions (Signed)

## 2015-02-25 ENCOUNTER — Telehealth: Payer: Self-pay | Admitting: Nurse Practitioner

## 2015-02-25 DIAGNOSIS — Z9289 Personal history of other medical treatment: Secondary | ICD-10-CM

## 2015-02-25 NOTE — Telephone Encounter (Signed)
Will you place order. Please see last mammogram report

## 2015-02-27 NOTE — Telephone Encounter (Signed)
Referral made for screening mammogram  

## 2015-02-28 NOTE — Telephone Encounter (Signed)
Pt aware.

## 2015-03-28 ENCOUNTER — Other Ambulatory Visit: Payer: Self-pay | Admitting: Nurse Practitioner

## 2015-03-30 ENCOUNTER — Telehealth: Payer: Self-pay | Admitting: Nurse Practitioner

## 2015-03-31 ENCOUNTER — Other Ambulatory Visit: Payer: Self-pay | Admitting: Nurse Practitioner

## 2015-03-31 DIAGNOSIS — Z1231 Encounter for screening mammogram for malignant neoplasm of breast: Secondary | ICD-10-CM

## 2015-04-04 ENCOUNTER — Encounter: Payer: Self-pay | Admitting: Nurse Practitioner

## 2015-04-04 ENCOUNTER — Encounter: Payer: Self-pay | Admitting: *Deleted

## 2015-04-04 ENCOUNTER — Ambulatory Visit (INDEPENDENT_AMBULATORY_CARE_PROVIDER_SITE_OTHER): Payer: PPO | Admitting: Nurse Practitioner

## 2015-04-04 VITALS — BP 144/82 | HR 68 | Temp 97.8°F | Ht 62.0 in | Wt 242.0 lb

## 2015-04-04 DIAGNOSIS — N3281 Overactive bladder: Secondary | ICD-10-CM

## 2015-04-04 DIAGNOSIS — K219 Gastro-esophageal reflux disease without esophagitis: Secondary | ICD-10-CM | POA: Diagnosis not present

## 2015-04-04 DIAGNOSIS — Z1159 Encounter for screening for other viral diseases: Secondary | ICD-10-CM | POA: Diagnosis not present

## 2015-04-04 DIAGNOSIS — M810 Age-related osteoporosis without current pathological fracture: Secondary | ICD-10-CM

## 2015-04-04 DIAGNOSIS — E785 Hyperlipidemia, unspecified: Secondary | ICD-10-CM

## 2015-04-04 DIAGNOSIS — F329 Major depressive disorder, single episode, unspecified: Secondary | ICD-10-CM | POA: Diagnosis not present

## 2015-04-04 DIAGNOSIS — F32A Depression, unspecified: Secondary | ICD-10-CM

## 2015-04-04 DIAGNOSIS — I10 Essential (primary) hypertension: Secondary | ICD-10-CM | POA: Diagnosis not present

## 2015-04-04 MED ORDER — OMEPRAZOLE 40 MG PO CPDR: DELAYED_RELEASE_CAPSULE | ORAL | Status: DC

## 2015-04-04 MED ORDER — LISINOPRIL 20 MG PO TABS: 20.0000 mg | ORAL_TABLET | Freq: Every day | ORAL | Status: DC

## 2015-04-04 MED ORDER — ATORVASTATIN CALCIUM 10 MG PO TABS: 10.0000 mg | ORAL_TABLET | Freq: Every day | ORAL | Status: DC

## 2015-04-04 NOTE — Patient Instructions (Signed)
Bone Health Bones protect organs, store calcium, and anchor muscles. Good health habits, such as eating nutritious foods and exercising regularly, are important for maintaining healthy bones. They can also help to prevent a condition that causes bones to lose density and become weak and brittle (osteoporosis). WHY IS BONE MASS IMPORTANT? Bone mass refers to the amount of bone tissue that you have. The higher your bone mass, the stronger your bones. An important step toward having healthy bones throughout life is to have strong and dense bones during childhood. A young adult who has a high bone mass is more likely to have a high bone mass later in life. Bone mass at its greatest it is called peak bone mass. A large decline in bone mass occurs in older adults. In women, it occurs about the time of menopause. During this time, it is important to practice good health habits, because if more bone is lost than what is replaced, the bones will become less healthy and more likely to break (fracture). If you find that you have a low bone mass, you may be able to prevent osteoporosis or further bone loss by changing your diet and lifestyle. HOW CAN I FIND OUT IF MY BONE MASS IS LOW? Bone mass can be measured with an X-ray test that is called a bone mineral density (BMD) test. This test is recommended for all women who are age 65 or older. It may also be recommended for men who are age 70 or older, or for people who are more likely to develop osteoporosis due to:  Having bones that break easily.  Having a long-term disease that weakens bones, such as kidney disease or rheumatoid arthritis.  Having menopause earlier than normal.  Taking medicine that weakens bones, such as steroids, thyroid hormones, or hormone treatment for breast cancer or prostate cancer.  Smoking.  Drinking three or more alcoholic drinks each day. WHAT ARE THE NUTRITIONAL RECOMMENDATIONS FOR HEALTHY BONES? To have healthy bones, you need  to get enough of the right minerals and vitamins. Most nutrition experts recommend getting these nutrients from the foods that you eat. Nutritional recommendations vary from person to person. Ask your health care provider what is healthy for you. Here are some general guidelines. Calcium Recommendations Calcium is the most important (essential) mineral for bone health. Most people can get enough calcium from their diet, but supplements may be recommended for people who are at risk for osteoporosis. Good sources of calcium include:  Dairy products, such as low-fat or nonfat milk, cheese, and yogurt.  Dark green leafy vegetables, such as bok choy and broccoli.  Calcium-fortified foods, such as orange juice, cereal, bread, soy beverages, and tofu products.  Nuts, such as almonds. Follow these recommended amounts for daily calcium intake:  Children, age 1-3: 700 mg.  Children, age 4-8: 1,000 mg.  Children, age 9-13: 1,300 mg.  Teens, age 14-18: 1,300 mg.  Adults, age 19-50: 1,000 mg.  Adults, age 51-70:  Men: 1,000 mg.  Women: 1,200 mg.  Adults, age 71 or older: 1,200 mg.  Pregnant and breastfeeding females:  Teens: 1,300 mg.  Adults: 1,000 mg. Vitamin D Recommendations Vitamin D is the most essential vitamin for bone health. It helps the body to absorb calcium. Sunlight stimulates the skin to make vitamin D, so be sure to get enough sunlight. If you live in a cold climate or you do not get outside often, your health care provider may recommend that you take vitamin D supplements. Good   sources of vitamin D in your diet include:  Egg yolks.  Saltwater fish.  Milk and cereal fortified with vitamin D. Follow these recommended amounts for daily vitamin D intake:  Children and teens, age 1-18: 600 international units.  Adults, age 50 or younger: 400-800 international units.  Adults, age 51 or older: 800-1,000 international units. Other Nutrients Other nutrients for bone  health include:  Phosphorus. This mineral is found in meat, poultry, dairy foods, nuts, and legumes. The recommended daily intake for adult men and adult women is 700 mg.  Magnesium. This mineral is found in seeds, nuts, dark green vegetables, and legumes. The recommended daily intake for adult men is 400-420 mg. For adult women, it is 310-320 mg.  Vitamin K. This vitamin is found in green leafy vegetables. The recommended daily intake is 120 mg for adult men and 90 mg for adult women. WHAT TYPE OF PHYSICAL ACTIVITY IS BEST FOR BUILDING AND MAINTAINING HEALTHY BONES? Weight-bearing and strength-building activities are important for building and maintaining peak bone mass. Weight-bearing activities cause muscles and bones to work against gravity. Strength-building activities increases muscle strength that supports bones. Weight-bearing and muscle-building activities include:  Walking and hiking.  Jogging and running.  Dancing.  Gym exercises.  Lifting weights.  Tennis and racquetball.  Climbing stairs.  Aerobics. Adults should get at least 30 minutes of moderate physical activity on most days. Children should get at least 60 minutes of moderate physical activity on most days. Ask your health care provide what type of exercise is best for you. WHERE CAN I FIND MORE INFORMATION? For more information, check out the following websites:  National Osteoporosis Foundation: http://nof.org/learn/basics  National Institutes of Health: http://www.niams.nih.gov/Health_Info/Bone/Bone_Health/bone_health_for_life.asp   This information is not intended to replace advice given to you by your health care provider. Make sure you discuss any questions you have with your health care provider.   Document Released: 03/10/2003 Document Revised: 05/04/2014 Document Reviewed: 12/23/2013 Elsevier Interactive Patient Education 2016 Elsevier Inc.  

## 2015-04-04 NOTE — Progress Notes (Signed)
Subjective:    Patient ID: Tracie Wiggins, female    DOB: 08/23/1945, 70 y.o.   MRN: [id number]  Patient here today for follow up of chronic medical problems.  Outpatient Encounter Prescriptions as of 04/04/2015  Medication Sig  . atorvastatin (LIPITOR) 10 MG tablet Take 1 tablet (10 mg total) by mouth daily.  [name] denosumab (PROLIA) 60 MG/ML SOLN injection Inject 60 mg into the skin every 6 (six) months. Administer in upper arm, thigh, or abdomen  . Incontinence Supply Disposable (DEPEND SILHOUETTE BRIEFS L/XL) MISC 1 each by Does not apply route 3 (three) times daily.  . Incontinence Supply Disposable (POISE HOURGLASS SHAPE) PADS 1 each by Does not apply route 4 (four) times daily - after meals and at bedtime.  [name] lisinopril (PRINIVIL,ZESTRIL) 20 MG tablet TAKE 1 TABLET ONCE DAILY  . Omega-3 Fatty Acids (FISH OIL) 1000 MG CAPS Take 2 capsules by mouth daily.   [name] omeprazole (PRILOSEC) 40 MG capsule TAKE (1) CAPSULE DAILY  . Pumpkin Seed-Soy Germ (AZO BLADDER CONTROL/GO-LESS PO) Take by mouth.  . sertraline (ZOLOFT) 50 MG tablet Take 1 tablet (50 mg total) by mouth daily. (Patient taking differently: Take 25 mg by mouth daily. )      Hypertension This is a chronic problem. The current episode started more than 1 year ago. The problem has been waxing and waning since onset. The problem is controlled. Pertinent negatives include no chest pain, headaches, neck pain, palpitations or shortness of breath. Risk factors for coronary artery disease include dyslipidemia, obesity and post-menopausal state. Past treatments include ACE inhibitors. The current treatment provides moderate improvement. Compliance problems include diet and exercise.   Hyperlipidemia This is a chronic problem. The current episode started more than 1 year ago. The problem is controlled. Recent lipid tests were reviewed and are variable. Pertinent negatives include no chest pain, myalgias or shortness of breath. Current  antihyperlipidemic treatment includes statins. The current treatment provides moderate improvement of lipids. Compliance problems include adherence to diet and adherence to exercise.  Risk factors for coronary artery disease include dyslipidemia, hypertension, post-menopausal and obesity.  GERD Omeprazole daily keeps symptoms  sunder control. Patient likes nexium better Depression Working well- helps to keep her relaxed. Osteoporosis prolia- no c/o side effects Over active bladder vesicare daily - no c/o side effects    Review of Systems  Constitutional: Negative for fever and chills.  HENT: Negative.   Eyes: Negative.   Respiratory: Negative.  Negative for shortness of breath.   Cardiovascular: Negative for chest pain and palpitations.  Gastrointestinal: Negative.   Genitourinary: Negative.   Musculoskeletal: Negative.  Negative for myalgias and neck pain.  Neurological: Negative for headaches.  Psychiatric/Behavioral: Negative.   All other systems reviewed and are negative.      Objective:   Physical Exam  Constitutional: She is oriented to person, place, and time. She appears well-developed and well-nourished.  HENT:  Nose: Nose normal.  Mouth/Throat: Oropharynx is clear and moist.  Eyes: EOM are normal.  Neck: Trachea normal, normal range of motion and full passive range of motion without pain. Neck supple. No JVD present. Carotid bruit is not present. No thyromegaly present.  Cardiovascular: Normal rate, regular rhythm, normal heart sounds and intact distal pulses.  Exam reveals no gallop and no friction rub.   No murmur heard. Pulmonary/Chest: Effort normal and breath sounds normal.  Abdominal: Soft. Bowel sounds are normal. She exhibits no distension and no mass. There is no tenderness.  Musculoskeletal: Normal range  of motion. She exhibits edema (mild edema bil lower ext).  Lymphadenopathy:    She has no cervical adenopathy.  Neurological: She is alert and oriented  to person, place, and time. She has normal reflexes.  Skin: Skin is warm and dry.  Psychiatric: She has a normal mood and affect. Her behavior is normal. Judgment and thought content normal.   BP 144/82 mmHg  Pulse 68  Temp(Src) 97.8 F (36.6 C) (Oral)  Ht '5\' 2"'  (1.575 m)  Wt 242 lb (109.77 kg)  BMI 44.25 kg/m2      Assessment & Plan:  1. Essential hypertension Do not add salt to diet - lisinopril (PRINIVIL,ZESTRIL) 20 MG tablet; Take 1 tablet (20 mg total) by mouth daily.  Dispense: 90 tablet; Refill: 1 - CMP14+EGFR  2. Gastroesophageal reflux disease without esophagitis Avoid spicy foods Do not eat 2 hours prior to bedtime - omeprazole (PRILOSEC) 40 MG capsule; TAKE (1) CAPSULE DAILY  Dispense: 90 capsule; Refill: 1  3. Osteoporosis Weight bearing exercises  4. OAB (overactive bladder) Currently does not want to take anything  5. Hyperlipidemia Low fat diet - atorvastatin (LIPITOR) 10 MG tablet; Take 1 tablet (10 mg total) by mouth daily.  Dispense: 90 tablet; Refill: 1 - Lipid panel  6. Depression Stress management  7. Morbid obesity, unspecified obesity type ([place]) Discussed diet and exercise for person with BMI >25 Will recheck weight in 3-6 months  8. Need for hepatitis C screening test - Hepatitis C antibody    Labs pending Health maintenance reviewed Diet and exercise encouraged Continue all meds Follow up  In 3 month   [place], FNP

## 2015-04-05 LAB — CMP14+EGFR
ALT: 20 IU/L (ref 0–32)
AST: 30 IU/L (ref 0–40)
Albumin/Globulin Ratio: 1.7 (ref 1.2–2.2)
Albumin: 4.2 g/dL (ref 3.5–4.8)
Alkaline Phosphatase: 95 IU/L (ref 39–117)
BUN/Creatinine Ratio: 15 (ref 12–28)
BUN: 12 mg/dL (ref 8–27)
Bilirubin Total: 0.4 mg/dL (ref 0.0–1.2)
CO2: 23 mmol/L (ref 18–29)
Calcium: 9.2 mg/dL (ref 8.7–10.3)
Chloride: 98 mmol/L (ref 96–106)
Creatinine, Ser: 0.78 mg/dL (ref 0.57–1.00)
GFR calc Af Amer: 88 mL/min/{1.73_m2} (ref >59)
GFR calc non Af Amer: 77 mL/min/{1.73_m2} (ref >59)
Globulin, Total: 2.5 g/dL (ref 1.5–4.5)
Glucose: 86 mg/dL (ref 65–99)
Potassium: 5.1 mmol/L (ref 3.5–5.2)
Sodium: 141 mmol/L (ref 134–144)
Total Protein: 6.7 g/dL (ref 6.0–8.5)

## 2015-04-05 LAB — LIPID PANEL
Chol/HDL Ratio: 2.6 ratio units (ref 0.0–4.4)
Cholesterol, Total: 172 mg/dL (ref 100–199)
HDL: 65 mg/dL (ref >39)
LDL Calculated: 77 mg/dL (ref 0–99)
Triglycerides: 151 mg/dL — ABNORMAL HIGH (ref 0–149)
VLDL Cholesterol Cal: 30 mg/dL (ref 5–40)

## 2015-04-05 LAB — HEPATITIS C ANTIBODY: Hep C Virus Ab: <0.1 s/co ratio (ref 0.0–0.9)

## 2015-04-07 ENCOUNTER — Ambulatory Visit (HOSPITAL_COMMUNITY): Payer: Medicare Other

## 2015-04-11 ENCOUNTER — Ambulatory Visit (HOSPITAL_COMMUNITY)
Admission: RE | Admit: 2015-04-11 | Discharge: 2015-04-11 | Disposition: A | Discharge status: Home / Self Care | Payer: PPO | Source: Ambulatory Visit | Attending: Nurse Practitioner | Admitting: Nurse Practitioner

## 2015-04-11 DIAGNOSIS — Z1231 Encounter for screening mammogram for malignant neoplasm of breast: Secondary | ICD-10-CM

## 2015-04-20 ENCOUNTER — Encounter (INDEPENDENT_AMBULATORY_CARE_PROVIDER_SITE_OTHER): Payer: Self-pay

## 2015-04-20 ENCOUNTER — Ambulatory Visit (INDEPENDENT_AMBULATORY_CARE_PROVIDER_SITE_OTHER): Payer: PPO | Admitting: Pharmacist

## 2015-04-20 ENCOUNTER — Encounter: Payer: Self-pay | Admitting: Pharmacist

## 2015-04-20 VITALS — BP 142/82 | HR 64 | Ht 62.25 in | Wt 245.0 lb

## 2015-04-20 DIAGNOSIS — Z0000 Encounter for general adult medical examination without abnormal findings: Secondary | ICD-10-CM | POA: Diagnosis not present

## 2015-04-20 NOTE — Progress Notes (Signed)
Patient ID: Tracie Wiggins, female   DOB: January 15, 1945, 70 y.o.   MRN: [id number]    Subjective:   Tracie Wiggins is a 70 y.o. female who presents for a subsequent Medicare Annual Wellness Visit.  We were also suppose to administer Prolia today but need to clarify with insurance the cost to patient.   Review of Systems  Review of Systems  HENT: Negative.   Eyes: Negative.   Respiratory: Negative.   Cardiovascular: Negative.   Gastrointestinal: Positive for heartburn.  Genitourinary:       Occ urinary incontinence - taking Vesicare    Musculoskeletal: Positive for joint pain.  Skin: Negative.   Neurological: Positive for dizziness and weakness.  Endo/Heme/Allergies: Negative.   Psychiatric/Behavioral: Negative.     Current Medications (verified) Outpatient Encounter Prescriptions as of 04/20/2015  Medication Sig  . atorvastatin (LIPITOR) 10 MG tablet Take 1 tablet (10 mg total) by mouth daily.  [name] denosumab (PROLIA) 60 MG/ML SOLN injection Inject 60 mg into the skin every 6 (six) months. Administer in upper arm, thigh, or abdomen  . Incontinence Supply Disposable (DEPEND SILHOUETTE BRIEFS L/XL) MISC 1 each by Does not apply route 3 (three) times daily.  . Incontinence Supply Disposable (POISE HOURGLASS SHAPE) PADS 1 each by Does not apply route 4 (four) times daily - after meals and at bedtime.  [name] lisinopril (PRINIVIL,ZESTRIL) 20 MG tablet Take 1 tablet (20 mg total) by mouth daily. (Patient taking differently: Take 10 mg by mouth daily. )  . Omega-3 Fatty Acids (FISH OIL) 1000 MG CAPS Take 2 capsules by mouth daily.   [name] omeprazole (PRILOSEC) 40 MG capsule TAKE (1) CAPSULE DAILY  . sertraline (ZOLOFT) 50 MG tablet Take 1 tablet (50 mg total) by mouth daily. (Patient taking differently: Take 25 mg by mouth daily. )  . VESICARE 10 MG tablet Take 10 mg by mouth daily.  . [DISCONTINUED] Pumpkin Seed-Soy Germ (AZO BLADDER CONTROL/GO-LESS PO) Take by mouth. Reported on 04/20/2015   No  facility-administered encounter medications on file as of 04/20/2015.    Allergies (verified) Asa; Codeine; Erythromycin; and Penicillins   History: Past Medical History  Diagnosis Date  . Anxiety   . GERD (gastroesophageal reflux disease)   . Hypertension   . Hyperlipidemia   . Osteoporosis   . Depression   . Cataract   . Allergy    Past Surgical History  Procedure Laterality Date  . Abdominal hysterectomy    . Cholecystectomy    . Appendectomy    . Nasal sinus surgery     Family History  Problem Relation Age of Onset  . COPD Mother   . Stroke Mother   . Heart disease Father   . Heart attack Father   . Heart disease Sister   . Diabetes Sister   . Heart attack Sister   . Stroke Sister    Social History   Occupational History  . Not on file.   Social History Main Topics  . Smoking status: Never Smoker   . Smokeless tobacco: Never Used  . Alcohol Use: No  . Drug Use: No  . Sexual Activity: No    Do you feel safe at home?  Yes Are there smokers in your home (other than you)? No  Dietary issues and exercise activities: Current Exercise Habits: The patient does not participate in regular exercise at present, Exercise limited by: orthopedic condition(s)  Current Dietary habits:  Patient not following any specific dietary recommendations  Objective:  Today's Vitals   04/20/15 1027  BP: 142/82  Pulse: 64  Height: 5' 2.25" (1.581 m)  Weight: 245 lb (111.131 kg)  PainSc: 2   PainLoc: Abdomen   Body mass index is 44.46 kg/(m^2).  Activities of Daily Living In your present state of health, do you have any difficulty performing the following activities: 04/20/2015  Hearing? N  Vision? N  Difficulty concentrating or making decisions? N  Walking or climbing stairs? Y  Dressing or bathing? N  Doing errands, shopping? N  Preparing Food and eating ? N  Using the Toilet? N  In the past six months, have you accidently leaked urine? Y  Do you have problems  with loss of bowel control? N  Managing your Medications? N  Managing your Finances? N  Housekeeping or managing your Housekeeping? N     Cardiac Risk Factors include: advanced age (>77men, >58 women);dyslipidemia;family history of premature cardiovascular disease;hypertension;obesity (BMI >30kg/m2);sedentary lifestyle  Depression Screen PHQ 2/9 Scores 04/20/2015 04/04/2015 10/04/2014 06/28/2014  PHQ - 2 Score 0 0 0 0     Fall Risk Fall Risk  04/20/2015 04/04/2015 10/04/2014 06/28/2014 04/07/2014  Falls in the past year? No No Yes No No  Number falls in past yr: - - 1 - -  Injury with Fall? - - No - -    Cognitive Function: MMSE - Mini Mental State Exam 04/20/2015 04/07/2014 04/07/2014  Orientation to time 5 5 5   Orientation to Place 5 5 5   Registration 3 3 3   Attention/ Calculation 4 5 5   Recall 3 2 2   Language- name 2 objects 2 2 2   Language- repeat 1 1 1   Language- follow 3 step command 2 3 3   Language- read & follow direction 1 1 -  Write a sentence 0 1 -  Copy design 0 0 -  Total score 26 28 -    Immunizations and Health Maintenance Immunization History  Administered Date(s) Administered  . Influenza Split 10/13/2012  . Influenza,inj,Quad PF,36+ Mos 10/01/2013, 10/04/2014  . Pneumococcal Conjugate-13 04/07/2014   Health Maintenance Due  Topic Date Due  . ZOSTAVAX  03/23/2004  . PNA vac Low Risk Adult (2 of 2 - PPSV23) 04/07/2015    Patient Care Team: Chevis Pretty, FNP as PCP - General (Nurse Practitioner) Particia Nearing, OD as Consulting Physician (Optometry) Irine Seal, MD as Attending Physician (Urology)  Indicate any recent Medical Services you may have received from other than [facility] providers in the past year (date may be approximate).    Assessment:    Annual Wellness Visit    Screening Tests Health Maintenance  Topic Date Due  . ZOSTAVAX  03/23/2004  . PNA vac Low Risk Adult (2 of 2 - PPSV23) 04/07/2015  . COLON CANCER SCREENING ANNUAL FOBT   06/28/2015  . INFLUENZA VACCINE  08/02/2015  . TETANUS/TDAP  01/02/2016  . MAMMOGRAM  04/10/2016  . DEXA SCAN  12/15/2016  . COLONOSCOPY  01/10/2022  . Hepatitis C Screening  Completed        Plan:   During the course of the visit Camri was educated and counseled about the following appropriate screening and preventive services:   Vaccines to include Pneumoccal, Influenza, Td, Zostavax - patient is due Zostavax but patient declined due to cost ($45)  Colorectal cancer screening - colonoscoy and FOBT are both UTD  Cardiovascular disease screening - EKG is UTD  Last lipids panel showed slightly elevated triglycerides  Diabetes screening - UTD 04/04/15  Bone Denisty /  Osteoporosis Screening - next due 12/2016  Mammogram - UTD last was 04/2015  PAP - no longer required  Nutrition counseling - discussed DASH diet  Advanced Directives - patient declined  Physical Activity - gave handout and discussed doing Chair Exercises to help with joints and core strength  Try APAP 500mg  1-2 tablets up to q8 hours if needed for pain  May continue to use muscle rub as needed for muscle pain  Will verify cost of Prolia and administered if patient agreeable to price  Patient Instructions (the written plan) were given to the patient.   Cherre Robins, [facility]   04/20/2015

## 2015-04-20 NOTE — Patient Instructions (Signed)
Tracie Wiggins , Thank you for taking time to come for your Medicare Wellness Visit. I appreciate your ongoing commitment to your health goals. Please review the following plan we discussed and let me know if I can assist you in the future.   These are the goals we discussed: Try to do chair exercises every day to help with arthritis and to keep core muscles strong Continue to use muscle rub as needed for pain.  You can also try tylenolol or acetaminophen 500mg  take 1 or 2 tablets up to very 8 hours as needed for pain.  Try to increased fresh fruits and vegetables and follow low salt DASH diet below.    This is a list of the screening recommended for you and due dates:  Health Maintenance  Topic Date Due  . Shingles Vaccine  03/23/2004 - estimated cost is $45  . Pneumonia vaccines (2 of 2 - PPSV23) Completed  . Stool Blood Test  06/28/2015  . Flu Shot  08/02/2015  . Tetanus Vaccine  01/02/2016  . Mammogram  04/10/2016  . DEXA scan (bone density measurement)  12/15/2016  . Colon Cancer Screening  01/10/2022  .  Hepatitis C: One time screening is recommended by Center for Disease Control  (CDC) for  adults born from [birth year] through 1966.   Completed   DASH Eating Plan DASH stands for "Dietary Approaches to Stop Hypertension." The DASH eating plan is a healthy eating plan that has been shown to reduce high blood pressure (hypertension). Additional health benefits may include reducing the risk of type 2 diabetes mellitus, heart disease, and stroke. The DASH eating plan may also help with weight loss. WHAT DO I NEED TO KNOW ABOUT THE DASH EATING PLAN? For the DASH eating plan, you will follow these general guidelines:  Choose foods with a percent daily value for sodium of less than 5% (as listed on the food label).  Use salt-free seasonings or herbs instead of table salt or sea salt.  Check with your health care provider or pharmacist before using salt substitutes.  Eat lower-sodium  products, often labeled as "lower sodium" or "no salt added."  Eat fresh foods.  Eat more vegetables, fruits, and low-fat dairy products.  Choose whole grains. Look for the word "whole" as the first word in the ingredient list.  Choose fish and skinless chicken or [place] more often than red meat. Limit fish, poultry, and meat to 6 oz (170 g) each day.  Limit sweets, desserts, sugars, and sugary drinks.  Choose heart-healthy fats.  Limit cheese to 1 oz (28 g) per day.  Eat more home-cooked food and less restaurant, buffet, and fast food.  Limit fried foods.  Cook foods using methods other than frying.  Limit canned vegetables. If you do use them, rinse them well to decrease the sodium.  When eating at a restaurant, ask that your food be prepared with less salt, or no salt if possible. WHAT FOODS CAN I EAT? Seek help from a dietitian for individual calorie needs. Grains Whole grain or whole wheat bread. Brown rice. Whole grain or whole wheat pasta. Quinoa, bulgur, and whole grain cereals. Low-sodium cereals. Corn or whole wheat flour tortillas. Whole grain cornbread. Whole grain crackers. Low-sodium crackers. Vegetables Fresh or frozen vegetables (raw, steamed, roasted, or grilled). Low-sodium or reduced-sodium tomato and vegetable juices. Low-sodium or reduced-sodium tomato sauce and paste. Low-sodium or reduced-sodium canned vegetables.  Fruits All fresh, canned (in natural juice), or frozen fruits. Meat and Other Protein  Products Ground beef (85% or leaner), grass-fed beef, or beef trimmed of fat. Skinless chicken or [place]. Ground chicken or [place]. Pork trimmed of fat. All fish and seafood. Eggs. Dried beans, peas, or lentils. Unsalted nuts and seeds. Unsalted canned beans. Dairy Low-fat dairy products, such as skim or 1% milk, 2% or reduced-fat cheeses, low-fat ricotta or cottage cheese, or plain low-fat yogurt. Low-sodium or reduced-sodium cheeses. Fats and Oils Tub  margarines without trans fats. Light or reduced-fat mayonnaise and salad dressings (reduced sodium). Avocado. Safflower, olive, or canola oils. Natural peanut or almond butter. Other Unsalted popcorn and pretzels. The items listed above may not be a complete list of recommended foods or beverages. Contact your dietitian for more options. WHAT FOODS ARE NOT RECOMMENDED? Grains White bread. White pasta. White rice. Refined cornbread. Bagels and croissants. Crackers that contain trans fat. Vegetables Creamed or fried vegetables. Vegetables in a cheese sauce. Regular canned vegetables. Regular canned tomato sauce and paste. Regular tomato and vegetable juices. Fruits Dried fruits. Canned fruit in light or heavy syrup. Fruit juice. Meat and Other Protein Products Fatty cuts of meat. Ribs, chicken wings, bacon, sausage, bologna, salami, chitterlings, fatback, hot dogs, bratwurst, and packaged luncheon meats. Salted nuts and seeds. Canned beans with salt. Dairy Whole or 2% milk, cream, half-and-half, and cream cheese. Whole-fat or sweetened yogurt. Full-fat cheeses or blue cheese. Nondairy creamers and whipped toppings. Processed cheese, cheese spreads, or cheese curds. Condiments Onion and garlic salt, seasoned salt, table salt, and sea salt. Canned and packaged gravies. Worcestershire sauce. Tartar sauce. Barbecue sauce. Teriyaki sauce. Soy sauce, including reduced sodium. Steak sauce. Fish sauce. Oyster sauce. Cocktail sauce. Horseradish. Ketchup and mustard. Meat flavorings and tenderizers. Bouillon cubes. Hot sauce. Tabasco sauce. Marinades. Taco seasonings. Relishes. Fats and Oils Butter, stick margarine, lard, shortening, ghee, and bacon fat. Coconut, palm kernel, or palm oils. Regular salad dressings. Other Pickles and olives. Salted popcorn and pretzels. The items listed above may not be a complete list of foods and beverages to avoid. Contact your dietitian for more information. WHERE CAN I  FIND MORE INFORMATION? National Heart, Lung, and Blood Institute: travelstabloid.com   This information is not intended to replace advice given to you by your health care provider. Make sure you discuss any questions you have with your health care provider.   Document Released: 12/07/2010 Document Revised: 01/08/2014 Document Reviewed: 10/22/2012 Elsevier Interactive Patient Education [employer].

## 2015-06-02 ENCOUNTER — Other Ambulatory Visit: Payer: Self-pay | Admitting: Pharmacist

## 2015-06-02 MED ORDER — ALENDRONATE SODIUM 70 MG PO TBEF: EFFERVESCENT_TABLET | ORAL | Status: DC

## 2015-07-18 DIAGNOSIS — H40053 Ocular hypertension, bilateral: Secondary | ICD-10-CM | POA: Diagnosis not present

## 2015-07-19 ENCOUNTER — Other Ambulatory Visit: Payer: Self-pay | Admitting: Pharmacist

## 2015-07-19 MED ORDER — ALENDRONATE SODIUM 70 MG PO TBEF: EFFERVESCENT_TABLET | ORAL | Status: DC

## 2015-08-04 ENCOUNTER — Telehealth: Payer: Self-pay

## 2015-08-04 NOTE — Telephone Encounter (Signed)
Had already received notification.  Copy is $85 which is still too high for patient.  Patient is aware and we are working on possible options.

## 2015-08-31 ENCOUNTER — Telehealth: Payer: Self-pay | Admitting: Nurse Practitioner

## 2015-08-31 MED ORDER — RISEDRONATE SODIUM 35 MG PO TBEC: 1.0000 | DELAYED_RELEASE_TABLET | ORAL | 5 refills | Status: DC

## 2015-08-31 NOTE — Telephone Encounter (Signed)
I spoke with patient's pharmacy about alternatives to Binostro since copay is $85.  Generic Atelvia would only be $40. Patient is agreeable to try generic atelvia / risendronate ER

## 2015-09-23 LAB — BASIC METABOLIC PANEL: Glucose: 93 mg/dL

## 2015-10-04 ENCOUNTER — Ambulatory Visit: Payer: PPO | Admitting: Nurse Practitioner

## 2015-10-06 ENCOUNTER — Ambulatory Visit (INDEPENDENT_AMBULATORY_CARE_PROVIDER_SITE_OTHER): Payer: PPO | Admitting: Nurse Practitioner

## 2015-10-06 ENCOUNTER — Encounter: Payer: Self-pay | Admitting: Nurse Practitioner

## 2015-10-06 VITALS — BP 144/82 | HR 81 | Temp 97.0°F | Ht 62.0 in | Wt 240.0 lb

## 2015-10-06 DIAGNOSIS — E785 Hyperlipidemia, unspecified: Secondary | ICD-10-CM

## 2015-10-06 DIAGNOSIS — F339 Major depressive disorder, recurrent, unspecified: Secondary | ICD-10-CM

## 2015-10-06 DIAGNOSIS — N3281 Overactive bladder: Secondary | ICD-10-CM

## 2015-10-06 DIAGNOSIS — K219 Gastro-esophageal reflux disease without esophagitis: Secondary | ICD-10-CM

## 2015-10-06 DIAGNOSIS — B029 Zoster without complications: Secondary | ICD-10-CM

## 2015-10-06 DIAGNOSIS — M810 Age-related osteoporosis without current pathological fracture: Secondary | ICD-10-CM | POA: Diagnosis not present

## 2015-10-06 DIAGNOSIS — I10 Essential (primary) hypertension: Secondary | ICD-10-CM | POA: Diagnosis not present

## 2015-10-06 MED ORDER — VALACYCLOVIR HCL 1 G PO TABS: 1000.0000 mg | ORAL_TABLET | Freq: Two times a day (BID) | ORAL | 0 refills | Status: DC

## 2015-10-06 MED ORDER — ATORVASTATIN CALCIUM 10 MG PO TABS: 10.0000 mg | ORAL_TABLET | Freq: Every day | ORAL | 1 refills | Status: DC

## 2015-10-06 MED ORDER — VESICARE 10 MG PO TABS: 10.0000 mg | ORAL_TABLET | Freq: Every day | ORAL | 5 refills | Status: DC

## 2015-10-06 MED ORDER — HYDROCODONE-ACETAMINOPHEN 5-325 MG PO TABS: 1.0000 | ORAL_TABLET | Freq: Four times a day (QID) | ORAL | 0 refills | Status: DC | PRN

## 2015-10-06 MED ORDER — OMEPRAZOLE 40 MG PO CPDR: DELAYED_RELEASE_CAPSULE | ORAL | 1 refills | Status: DC

## 2015-10-06 NOTE — Patient Instructions (Signed)

## 2015-10-06 NOTE — Progress Notes (Signed)
Subjective:    Patient ID: Tracie Wiggins, female    DOB: Sep 04, 1945, 70 y.o.   MRN: [id number]  Patient here today for follow up of chronic medical problems. In addition she had developed a rash on her left side.    Outpatient Encounter Prescriptions as of 10/06/2015  Medication Sig Note  . atorvastatin (LIPITOR) 10 MG tablet Take 1 tablet (10 mg total) by mouth daily.   . Incontinence Supply Disposable (DEPEND SILHOUETTE BRIEFS L/XL) MISC 1 each by Does not apply route 3 (three) times daily.   . Incontinence Supply Disposable (POISE HOURGLASS SHAPE) PADS 1 each by Does not apply route 4 (four) times daily - after meals and at bedtime.   [name] lisinopril (PRINIVIL,ZESTRIL) 20 MG tablet Take 1 tablet (20 mg total) by mouth daily. (Patient taking differently: Take 10 mg by mouth daily. )   . Omega-3 Fatty Acids (FISH OIL) 1000 MG CAPS Take 2 capsules by mouth daily.    [name] omeprazole (PRILOSEC) 40 MG capsule TAKE (1) CAPSULE DAILY   . Risedronate Sodium 35 MG TBEC Take 1 tablet (35 mg total) by mouth once a week.   . sertraline (ZOLOFT) 25 MG tablet Take 25 mg by mouth daily.   . VESICARE 10 MG tablet Take 10 mg by mouth daily. 04/20/2015: Received from: External Pharmacy Received Sig:   . [DISCONTINUED] sertraline (ZOLOFT) 50 MG tablet Take 1 tablet (50 mg total) by mouth daily. (Patient taking differently: Take 25 mg by mouth daily. )    No facility-administered encounter medications on file as of 10/06/2015.     Hypertension  This is a chronic problem. The current episode started more than 1 year ago. The problem has been waxing and waning since onset. The problem is controlled. Pertinent negatives include no chest pain, headaches, neck pain, palpitations or shortness of breath. Risk factors for coronary artery disease include dyslipidemia, obesity and post-menopausal state. Past treatments include ACE inhibitors. The current treatment provides moderate improvement. Compliance problems include  diet and exercise.   Hyperlipidemia  This is a chronic problem. The current episode started more than 1 year ago. The problem is controlled. Recent lipid tests were reviewed and are variable. Exacerbating diseases include obesity. Pertinent negatives include no chest pain, myalgias or shortness of breath. Current antihyperlipidemic treatment includes statins. The current treatment provides moderate improvement of lipids. Compliance problems include adherence to diet and adherence to exercise.  Risk factors for coronary artery disease include dyslipidemia, hypertension, post-menopausal and obesity.  Rash  This is a new problem. The current episode started in the past 7 days. The problem has been gradually worsening since onset. The affected locations include the torso and back. The rash is characterized by blistering, itchiness, redness, burning and pain. She was exposed to nothing. Pertinent negatives include no fatigue, fever or shortness of breath. Her past medical history is significant for allergies and varicella. There is no history of asthma or eczema.  The patient applies Vaseline to relieve some of the burning sensation. She had the chickenpox approximately 20 years ago. The rash is only located on the left side of her body and wraps around from her back to her lower abdomen.  GERD Omeprazole daily keeps symptoms under control.  Depression Zoloft working well- helps to keep her relaxed. Osteoporosis No longer getting Prolia injections. She started with Risedronate 6-8 wks ago and is tolerating it well.   Over active bladder Vesicare daily - no c/o side effects    Review  of Systems  Constitutional: Negative for activity change, appetite change, chills, fatigue and fever.  HENT: Negative.   Eyes: Negative.   Respiratory: Negative for shortness of breath.   Cardiovascular: Negative for chest pain and palpitations.  Gastrointestinal: Positive for constipation.  Genitourinary: Negative.    Musculoskeletal: Negative.  Negative for myalgias and neck pain.  Skin: Positive for rash.       Red blistery rash appeared 2 days ago and has gradually worsened. See HPI for details.  Neurological: Negative for headaches.  Psychiatric/Behavioral: Negative.   All other systems reviewed and are negative.      Objective:   Physical Exam  Constitutional: She is oriented to person, place, and time. She appears well-developed and well-nourished.  HENT:  Head: Normocephalic.  Right Ear: External ear normal.  Left Ear: External ear normal.  Nose: Nose normal.  Mouth/Throat: Oropharynx is clear and moist.  Eyes: Conjunctivae and EOM are normal. Pupils are equal, round, and reactive to light.  Neck: Trachea normal, normal range of motion and full passive range of motion without pain. Neck supple. No JVD present. Carotid bruit is not present. No thyromegaly present.  Cardiovascular: Normal rate, regular rhythm, normal heart sounds and intact distal pulses.  Exam reveals no gallop and no friction rub.   No murmur heard. Pulmonary/Chest: Effort normal and breath sounds normal.  Abdominal: Soft. Bowel sounds are normal. She exhibits no distension and no mass. There is no tenderness.  Musculoskeletal: Normal range of motion. She exhibits edema (mild edema bil lower ext).  Lymphadenopathy:    She has no cervical adenopathy.  Neurological: She is alert and oriented to person, place, and time. She has normal reflexes.  Skin: Skin is warm and dry. Rash noted.  Blistery, erythematous rash to left side of back that extends around to the lower abdomen.  Psychiatric: She has a normal mood and affect. Her behavior is normal. Judgment and thought content normal.   BP (!) 144/82   Pulse 81   Temp 97 F (36.1 C) (Oral)   Ht '5\' 2"'$  (1.575 m)   Wt 240 lb (108.9 kg)   BMI 43.90 kg/m       Assessment & Plan:  1. Essential hypertension Do not add salt to diet. - CMP14+EGFR  2. Gastroesophageal  reflux disease without esophagitis Avoid spicy foods Do not eat 2 hours prior to bedtime - omeprazole (PRILOSEC) 40 MG capsule; TAKE (1) CAPSULE DAILY  Dispense: 90 capsule; Refill: 1  3. Age-related osteoporosis without current pathological fracture Weight bearing exercises as tolerated Continue current treatment  4. OAB (overactive bladder) - VESICARE 10 MG tablet; Take 1 tablet (10 mg total) by mouth daily.  Dispense: 30 tablet; Refill: 5  5. Hyperlipidemia, unspecified hyperlipidemia type Avoid fatty foods - Lipid panel - atorvastatin (LIPITOR) 10 MG tablet; Take 1 tablet (10 mg total) by mouth daily.  Dispense: 90 tablet; Refill: 1  6. Recurrent major depressive disorder, remission status unspecified ([place]) Continue current treatment Stress managment  7. Morbid obesity ([place]) Discussed diet and exercise for person with BMI >25 Will recheck weight in 3-6 months  8. Herpes zoster without complication Try not to scratch Keep area covered to avoid spreading - valACYclovir (VALTREX) 1000 MG tablet; Take 1 tablet (1,000 mg total) by mouth 2 (two) times daily.  Dispense: 21 tablet; Refill: 0 - HYDROcodone-acetaminophen (LORTAB) 5-325 MG tablet; Take 1 tablet by mouth every 6 (six) hours as needed for moderate pain.  Dispense: 60 tablet; Refill: 0  Labs pending Health maintenance reviewed Diet and exercise encouraged Continue all meds Follow up  In 3 months   [place], FNP

## 2015-10-07 LAB — CMP14+EGFR
ALT: 17 IU/L (ref 0–32)
AST: 17 IU/L (ref 0–40)
Albumin/Globulin Ratio: 1.8 (ref 1.2–2.2)
Albumin: 4.1 g/dL (ref 3.5–4.8)
Alkaline Phosphatase: 112 IU/L (ref 39–117)
BUN/Creatinine Ratio: 17 (ref 12–28)
BUN: 17 mg/dL (ref 8–27)
Bilirubin Total: 0.4 mg/dL (ref 0.0–1.2)
CO2: 25 mmol/L (ref 18–29)
Calcium: 8.9 mg/dL (ref 8.7–10.3)
Chloride: 103 mmol/L (ref 96–106)
Creatinine, Ser: 1.02 mg/dL — ABNORMAL HIGH (ref 0.57–1.00)
GFR calc Af Amer: 64 mL/min/{1.73_m2} (ref >59)
GFR calc non Af Amer: 55 mL/min/{1.73_m2} — ABNORMAL LOW (ref >59)
Globulin, Total: 2.3 g/dL (ref 1.5–4.5)
Glucose: 97 mg/dL (ref 65–99)
Potassium: 4.7 mmol/L (ref 3.5–5.2)
Sodium: 142 mmol/L (ref 134–144)
Total Protein: 6.4 g/dL (ref 6.0–8.5)

## 2015-10-07 LAB — LIPID PANEL
Chol/HDL Ratio: 3 ratio units (ref 0.0–4.4)
Cholesterol, Total: 160 mg/dL (ref 100–199)
HDL: 54 mg/dL (ref >39)
LDL Calculated: 78 mg/dL (ref 0–99)
Triglycerides: 139 mg/dL (ref 0–149)
VLDL Cholesterol Cal: 28 mg/dL (ref 5–40)

## 2016-01-06 ENCOUNTER — Encounter: Payer: Self-pay | Admitting: Nurse Practitioner

## 2016-01-06 ENCOUNTER — Ambulatory Visit (INDEPENDENT_AMBULATORY_CARE_PROVIDER_SITE_OTHER): Payer: PPO | Admitting: Nurse Practitioner

## 2016-01-06 VITALS — BP 142/104 | HR 96 | Temp 97.5°F | Ht 62.0 in | Wt 232.0 lb

## 2016-01-06 DIAGNOSIS — I10 Essential (primary) hypertension: Secondary | ICD-10-CM

## 2016-01-06 DIAGNOSIS — F339 Major depressive disorder, recurrent, unspecified: Secondary | ICD-10-CM | POA: Diagnosis not present

## 2016-01-06 DIAGNOSIS — N3281 Overactive bladder: Secondary | ICD-10-CM | POA: Diagnosis not present

## 2016-01-06 DIAGNOSIS — E785 Hyperlipidemia, unspecified: Secondary | ICD-10-CM | POA: Diagnosis not present

## 2016-01-06 DIAGNOSIS — M810 Age-related osteoporosis without current pathological fracture: Secondary | ICD-10-CM

## 2016-01-06 DIAGNOSIS — K219 Gastro-esophageal reflux disease without esophagitis: Secondary | ICD-10-CM | POA: Diagnosis not present

## 2016-01-06 DIAGNOSIS — Z23 Encounter for immunization: Secondary | ICD-10-CM

## 2016-01-06 MED ORDER — LISINOPRIL 40 MG PO TABS
40.0000 mg | ORAL_TABLET | Freq: Every day | ORAL | 3 refills | Status: DC
Start: 2016-01-06 — End: 2016-07-05

## 2016-01-06 MED ORDER — SERTRALINE HCL 25 MG PO TABS: 25.0000 mg | ORAL_TABLET | Freq: Every day | ORAL | 3 refills | Status: DC

## 2016-01-06 NOTE — Addendum Note (Signed)
Addended by: Chevis Pretty on: 01/06/2016 02:32 PM   Modules accepted: Orders

## 2016-01-06 NOTE — Progress Notes (Signed)
Subjective:    Patient ID: Tracie Wiggins, female    DOB: 11-12-1945, 71 y.o.   MRN: [id number]  Patient here today for follow up of chronic medical problems.No complaints today.  Outpatient Encounter Prescriptions as of 01/06/2016  Medication Sig  . atorvastatin (LIPITOR) 10 MG tablet Take 1 tablet (10 mg total) by mouth daily.  [name] HYDROcodone-acetaminophen (LORTAB) 5-325 MG tablet Take 1 tablet by mouth every 6 (six) hours as needed for moderate pain.  . Incontinence Supply Disposable (DEPEND SILHOUETTE BRIEFS L/XL) MISC 1 each by Does not apply route 3 (three) times daily.  . Incontinence Supply Disposable (POISE HOURGLASS SHAPE) PADS 1 each by Does not apply route 4 (four) times daily - after meals and at bedtime.  [name] lisinopril (PRINIVIL,ZESTRIL) 20 MG tablet Take 1 tablet (20 mg total) by mouth daily. (Patient taking differently: Take 10 mg by mouth daily. )  . Omega-3 Fatty Acids (FISH OIL) 1000 MG CAPS Take 2 capsules by mouth daily.   [name] omeprazole (PRILOSEC) 40 MG capsule TAKE (1) CAPSULE DAILY  . Risedronate Sodium 35 MG TBEC Take 1 tablet (35 mg total) by mouth once a week.  . sertraline (ZOLOFT) 25 MG tablet Take 25 mg by mouth daily.  . valACYclovir (VALTREX) 1000 MG tablet Take 1 tablet (1,000 mg total) by mouth 2 (two) times daily.  . VESICARE 10 MG tablet Take 1 tablet (10 mg total) by mouth daily.   No facility-administered encounter medications on file as of 01/06/2016.    * patient had shingles at last visit- have completely resolved.  Hypertension  This is a chronic problem. The current episode started more than 1 year ago. The problem has been waxing and waning since onset. The problem is controlled. Pertinent negatives include no chest pain, headaches, neck pain, palpitations or shortness of breath. Risk factors for coronary artery disease include dyslipidemia, obesity and post-menopausal state. Past treatments include ACE inhibitors. The current treatment provides  moderate improvement. Compliance problems include diet and exercise.   Hyperlipidemia  This is a chronic problem. The current episode started more than 1 year ago. The problem is controlled. Recent lipid tests were reviewed and are variable. Exacerbating diseases include obesity. Pertinent negatives include no chest pain, myalgias or shortness of breath. Current antihyperlipidemic treatment includes statins. The current treatment provides moderate improvement of lipids. Compliance problems include adherence to diet and adherence to exercise.  Risk factors for coronary artery disease include dyslipidemia, hypertension, post-menopausal and obesity.  The patient applies Vaseline to relieve some of the burning sensation. She had the chickenpox approximately 20 years ago. The rash is only located on the left side of her body and wraps around from her back to her lower abdomen.  GERD Omeprazole daily keeps symptoms under control.  Depression Zoloft working well- helps to keep her relaxed. Osteoporosis No longer getting Prolia injections. She started with Risedronate 6-8 wks ago and is tolerating it well.   Over active bladder Vesicare daily - no c/o side effects    Review of Systems  Constitutional: Negative for activity change, appetite change, chills, fatigue and fever.  HENT: Negative.   Eyes: Negative.   Respiratory: Negative for shortness of breath.   Cardiovascular: Negative for chest pain and palpitations.  Gastrointestinal: Positive for constipation.  Genitourinary: Negative.   Musculoskeletal: Negative.  Negative for myalgias and neck pain.  Neurological: Negative for headaches.  Psychiatric/Behavioral: Negative.   All other systems reviewed and are negative.      Objective:  Physical Exam  Constitutional: She is oriented to person, place, and time. She appears well-developed and well-nourished.  HENT:  Head: Normocephalic.  Right Ear: External ear normal.  Left Ear: External  ear normal.  Nose: Nose normal.  Mouth/Throat: Oropharynx is clear and moist.  Eyes: Conjunctivae and EOM are normal. Pupils are equal, round, and reactive to light.  Neck: Trachea normal, normal range of motion and full passive range of motion without pain. Neck supple. No JVD present. Carotid bruit is not present. No thyromegaly present.  Cardiovascular: Normal rate, regular rhythm, normal heart sounds and intact distal pulses.  Exam reveals no gallop and no friction rub.   No murmur heard. Pulmonary/Chest: Effort normal and breath sounds normal.  Abdominal: Soft. Bowel sounds are normal. She exhibits no distension and no mass. There is no tenderness.  Musculoskeletal: Normal range of motion. Edema: mild edema bil lower ext.  Lymphadenopathy:    She has no cervical adenopathy.  Neurological: She is alert and oriented to person, place, and time. She has normal reflexes.  Skin: Skin is warm and dry.  Psychiatric: She has a normal mood and affect. Her behavior is normal. Judgment and thought content normal.   BP (!) 148/106   Pulse 96   Temp 97.5 F (36.4 C) (Oral)   Ht 5\' 2"  (1.575 m)   Wt 232 lb (105.2 kg)   BMI 42.43 kg/m       Assessment & Plan:  1. Essential hypertension Low sodium diet Increased liainopril tp 40mg  today - lisinopril (PRINIVIL,ZESTRIL) 40 MG tablet; Take 1 tablet (40 mg total) by mouth daily.  Dispense: 90 tablet; Refill: 3  2. Gastroesophageal reflux disease without esophagitis Avoid spicy foods Do not eat 2 hours prior to bedtime  3. Age-related osteoporosis without current pathological fracture Weight bearing exercises  4. OAB (overactive bladder)  5. Recurrent major depressive disorder, remission status unspecified ([place]) Stress management  6. Hyperlipidemia, unspecified hyperlipidemia type Low fat diet  7. Morbid obesity ([place]) Discussed diet and exercise for person with BMI >25 Will recheck weight in 3-6 months    Labs pending Health  maintenance reviewed Diet and exercise encouraged Continue all meds Follow up  In 6 months   [place], FNP

## 2016-01-06 NOTE — Patient Instructions (Signed)
Bone Health Introduction Bones protect organs, store calcium, and anchor muscles. Good health habits, such as eating nutritious foods and exercising regularly, are important for maintaining healthy bones. They can also help to prevent a condition that causes bones to lose density and become weak and brittle (osteoporosis). Why is bone mass important? Bone mass refers to the amount of bone tissue that you have. The higher your bone mass, the stronger your bones. An important step toward having healthy bones throughout life is to have strong and dense bones during childhood. A young adult who has a high bone mass is more likely to have a high bone mass later in life. Bone mass at its greatest it is called peak bone mass. A large decline in bone mass occurs in older adults. In women, it occurs about the time of menopause. During this time, it is important to practice good health habits, because if more bone is lost than what is replaced, the bones will become less healthy and more likely to break (fracture). If you find that you have a low bone mass, you may be able to prevent osteoporosis or further bone loss by changing your diet and lifestyle. How can I find out if my bone mass is low? Bone mass can be measured with an X-ray test that is called a bone mineral density (BMD) test. This test is recommended for all women who are age 65 or older. It may also be recommended for men who are age 70 or older, or for people who are more likely to develop osteoporosis due to:  Having bones that break easily.  Having a long-term disease that weakens bones, such as kidney disease or rheumatoid arthritis.  Having menopause earlier than normal.  Taking medicine that weakens bones, such as steroids, thyroid hormones, or hormone treatment for breast cancer or prostate cancer.  Smoking.  Drinking three or more alcoholic drinks each day. What are the nutritional recommendations for healthy bones? To have healthy  bones, you need to get enough of the right minerals and vitamins. Most nutrition experts recommend getting these nutrients from the foods that you eat. Nutritional recommendations vary from person to person. Ask your health care provider what is healthy for you. Here are some general guidelines. Calcium Recommendations  Calcium is the most important (essential) mineral for bone health. Most people can get enough calcium from their diet, but supplements may be recommended for people who are at risk for osteoporosis. Good sources of calcium include:  Dairy products, such as low-fat or nonfat milk, cheese, and yogurt.  Dark green leafy vegetables, such as bok choy and broccoli.  Calcium-fortified foods, such as orange juice, cereal, bread, soy beverages, and tofu products.  Nuts, such as almonds. Follow these recommended amounts for daily calcium intake:  Children, age 1?3: 700 mg.  Children, age 4?8: 1,000 mg.  Children, age 9?13: 1,300 mg.  Teens, age 14?18: 1,300 mg.  Adults, age 19?50: 1,000 mg.  Adults, age 51?70:  Men: 1,000 mg.  Women: 1,200 mg.  Adults, age 71 or older: 1,200 mg.  Pregnant and breastfeeding females:  Teens: 1,300 mg.  Adults: 1,000 mg. Vitamin D Recommendations  Vitamin D is the most essential vitamin for bone health. It helps the body to absorb calcium. Sunlight stimulates the skin to make vitamin D, so be sure to get enough sunlight. If you live in a cold climate or you do not get outside often, your health care provider may recommend that you take vitamin   D supplements. Good sources of vitamin D in your diet include:  Egg yolks.  Saltwater fish.  Milk and cereal fortified with vitamin D. Follow these recommended amounts for daily vitamin D intake:  Children and teens, age 1?18: 600 international units.  Adults, age 50 or younger: 400-800 international units.  Adults, age 51 or older: 800-1,000 international units. Other Nutrients  Other  nutrients for bone health include:  Phosphorus. This mineral is found in meat, poultry, dairy foods, nuts, and legumes. The recommended daily intake for adult men and adult women is 700 mg.  Magnesium. This mineral is found in seeds, nuts, dark green vegetables, and legumes. The recommended daily intake for adult men is 400?420 mg. For adult women, it is 310?320 mg.  Vitamin K. This vitamin is found in green leafy vegetables. The recommended daily intake is 120 mg for adult men and 90 mg for adult women. What type of physical activity is best for building and maintaining healthy bones? Weight-bearing and strength-building activities are important for building and maintaining peak bone mass. Weight-bearing activities cause muscles and bones to work against gravity. Strength-building activities increases muscle strength that supports bones. Weight-bearing and muscle-building activities include:  Walking and hiking.  Jogging and running.  Dancing.  Gym exercises.  Lifting weights.  Tennis and racquetball.  Climbing stairs.  Aerobics. Adults should get at least 30 minutes of moderate physical activity on most days. Children should get at least 60 minutes of moderate physical activity on most days. Ask your health care provide what type of exercise is best for you. Where can I find more information? For more information, check out the following websites:  National Osteoporosis Foundation: http://nof.org/learn/basics  National Institutes of Health: http://www.niams.nih.gov/Health_Info/Bone/Bone_Health/bone_health_for_life.asp This information is not intended to replace advice given to you by your health care provider. Make sure you discuss any questions you have with your health care provider. Document Released: 03/10/2003 Document Revised: 07/08/2015 Document Reviewed: 12/23/2013  2017 Elsevier  

## 2016-01-07 LAB — CMP14+EGFR
ALT: 20 IU/L (ref 0–32)
AST: 18 IU/L (ref 0–40)
Albumin/Globulin Ratio: 1.5 (ref 1.2–2.2)
Albumin: 4.1 g/dL (ref 3.5–4.8)
Alkaline Phosphatase: 111 IU/L (ref 39–117)
BUN/Creatinine Ratio: 12 (ref 12–28)
BUN: 13 mg/dL (ref 8–27)
Bilirubin Total: 0.4 mg/dL (ref 0.0–1.2)
CO2: 26 mmol/L (ref 18–29)
Calcium: 10 mg/dL (ref 8.7–10.3)
Chloride: 99 mmol/L (ref 96–106)
Creatinine, Ser: 1.06 mg/dL — ABNORMAL HIGH (ref 0.57–1.00)
GFR calc Af Amer: 61 mL/min/{1.73_m2} (ref >59)
GFR calc non Af Amer: 53 mL/min/{1.73_m2} — ABNORMAL LOW (ref >59)
Globulin, Total: 2.7 g/dL (ref 1.5–4.5)
Glucose: 70 mg/dL (ref 65–99)
Potassium: 5.1 mmol/L (ref 3.5–5.2)
Sodium: 143 mmol/L (ref 134–144)
Total Protein: 6.8 g/dL (ref 6.0–8.5)

## 2016-01-07 LAB — LIPID PANEL
Chol/HDL Ratio: 3.5 ratio units (ref 0.0–4.4)
Cholesterol, Total: 183 mg/dL (ref 100–199)
HDL: 52 mg/dL (ref >39)
LDL Calculated: 93 mg/dL (ref 0–99)
Triglycerides: 190 mg/dL — ABNORMAL HIGH (ref 0–149)
VLDL Cholesterol Cal: 38 mg/dL (ref 5–40)

## 2016-01-16 ENCOUNTER — Other Ambulatory Visit: Payer: Self-pay | Admitting: Nurse Practitioner

## 2016-01-16 DIAGNOSIS — I10 Essential (primary) hypertension: Secondary | ICD-10-CM

## 2016-02-24 ENCOUNTER — Ambulatory Visit (INDEPENDENT_AMBULATORY_CARE_PROVIDER_SITE_OTHER): Payer: PPO | Admitting: Urology

## 2016-02-24 DIAGNOSIS — N3946 Mixed incontinence: Secondary | ICD-10-CM

## 2016-03-13 ENCOUNTER — Telehealth: Payer: Self-pay | Admitting: Nurse Practitioner

## 2016-04-23 ENCOUNTER — Encounter: Payer: Self-pay | Admitting: Pharmacist

## 2016-04-23 ENCOUNTER — Ambulatory Visit (INDEPENDENT_AMBULATORY_CARE_PROVIDER_SITE_OTHER): Payer: PPO | Admitting: Pharmacist

## 2016-04-23 VITALS — BP 140/82 | HR 64 | Ht 62.0 in | Wt 232.0 lb

## 2016-04-23 DIAGNOSIS — Z0000 Encounter for general adult medical examination without abnormal findings: Secondary | ICD-10-CM | POA: Diagnosis not present

## 2016-04-23 DIAGNOSIS — Z1239 Encounter for other screening for malignant neoplasm of breast: Secondary | ICD-10-CM

## 2016-04-23 DIAGNOSIS — M810 Age-related osteoporosis without current pathological fracture: Secondary | ICD-10-CM

## 2016-04-23 DIAGNOSIS — R6889 Other general symptoms and signs: Secondary | ICD-10-CM

## 2016-04-23 MED ORDER — DARIFENACIN HYDROBROMIDE ER 7.5 MG PO TB24: 7.5000 mg | ORAL_TABLET | Freq: Every day | ORAL | 1 refills | Status: DC

## 2016-04-23 NOTE — Progress Notes (Signed)
Patient ID: Tracie Wiggins, female   DOB: 03-03-1945, 71 y.o.   MRN: [id number]     Subjective:   Tracie Wiggins is a 71 y.o. female who presents for a subsequent  Medicare Annual Wellness Visit.  Social History: Tracie Wiggins is widowed for 12 years. She lives alone but has 1 cat and 1 dog.  She has a daughter and step son who both live close by.   She retired about 6 years ago from [employer].  She attends church regularly and likes to garden.   Review of Systems  Review of Systems  Constitutional: Negative.        Patient mentions that she feel very cold in the evening and night.     HENT: Negative.   Eyes: Negative.   Respiratory: Negative.   Cardiovascular: Negative.   Gastrointestinal: Negative.   Genitourinary: Positive for frequency and urgency (states that generic Detrol LA 4mg  is not effective.).  Skin: Negative.   Neurological: Negative.   Endo/Heme/Allergies: Negative.   Psychiatric/Behavioral: Negative.      Current Medications (verified) Outpatient Encounter Prescriptions as of 04/23/2016  Medication Sig  . atorvastatin (LIPITOR) 10 MG tablet Take 1 tablet (10 mg total) by mouth daily.  . Incontinence Supply Disposable (DEPEND SILHOUETTE BRIEFS L/XL) MISC 1 each by Does not apply route 3 (three) times daily.  . Incontinence Supply Disposable (POISE HOURGLASS SHAPE) PADS 1 each by Does not apply route 4 (four) times daily - after meals and at bedtime.  [name] lisinopril (PRINIVIL,ZESTRIL) 40 MG tablet Take 1 tablet (40 mg total) by mouth daily.  . Omega-3 Fatty Acids (FISH OIL) 1000 MG CAPS Take 2 capsules by mouth daily.   [name] omeprazole (PRILOSEC) 40 MG capsule TAKE (1) CAPSULE DAILY  . Risedronate Sodium 35 MG TBEC Take 1 tablet (35 mg total) by mouth once a week.  . sertraline (ZOLOFT) 25 MG tablet Take 1 tablet (25 mg total) by mouth daily.  . [DISCONTINUED] tolterodine (DETROL LA) 4 MG 24 hr capsule Take 4 mg by mouth daily.  [name] darifenacin (ENABLEX) 7.5 MG 24 hr  tablet Take 1 tablet (7.5 mg total) by mouth daily.  . [DISCONTINUED] HYDROcodone-acetaminophen (LORTAB) 5-325 MG tablet Take 1 tablet by mouth every 6 (six) hours as needed for moderate pain. (Patient not taking: Reported on 04/23/2016)  . [DISCONTINUED] lisinopril (PRINIVIL,ZESTRIL) 20 MG tablet TAKE 1 TABLET DAILY (Patient not taking: Reported on 04/23/2016)  . [DISCONTINUED] valACYclovir (VALTREX) 1000 MG tablet Take 1 tablet (1,000 mg total) by mouth 2 (two) times daily. (Patient not taking: Reported on 04/23/2016)  . [DISCONTINUED] VESICARE 10 MG tablet Take 1 tablet (10 mg total) by mouth daily. (Patient not taking: Reported on 04/23/2016)   No facility-administered encounter medications on file as of 04/23/2016.     Allergies (verified) Asa [aspirin]; Codeine; Erythromycin; and Penicillins   History: Past Medical History:  Diagnosis Date  . Allergy   . Anxiety   . Cataract   . Depression   . GERD (gastroesophageal reflux disease)   . Hyperlipidemia   . Hypertension   . Osteoporosis   . Shingles    twice   Past Surgical History:  Procedure Laterality Date  . ABDOMINAL HYSTERECTOMY    . APPENDECTOMY    . CHOLECYSTECTOMY    . NASAL SINUS SURGERY     Family History  Problem Relation Age of Onset  . COPD Mother   . Stroke Mother   . Heart disease Father   .  Heart attack Father   . Heart disease Sister   . Diabetes Sister   . Heart attack Sister   . Stroke Sister    Social History   Occupational History  . Not on file.   Social History Main Topics  . Smoking status: Never Smoker  . Smokeless tobacco: Never Used  . Alcohol use No  . Drug use: No  . Sexual activity: No    Do you feel safe at home?  Yes Are there smokers in your home (other than you)? No  Dietary issues and exercise activities: Current Exercise Habits: Home exercise routine, Type of exercise: walking, Time (Minutes): 15, Frequency (Times/Week): 6, Weekly Exercise (Minutes/Week): 90, Intensity:  Moderate  Current Dietary habits:  Over the last year patient has decreased portion sizes and her weight has decreased 13#.  She usually eats 3 meals per day but reports that she get hungry between breakfast and lunch because her cereal dose not last her until lunch.    Objective:    Today's Vitals   04/23/16 1216  BP: 140/82  Pulse: 64  Weight: 232 lb (105.2 kg)  Height: 5\' 2"  (1.575 m)  PainSc: 0-No pain   Body mass index is 42.43 kg/m.  Activities of Daily Living In your present state of health, do you have any difficulty performing the following activities: 04/23/2016  Hearing? N  Vision? N  Difficulty concentrating or making decisions? N  Walking or climbing stairs? N  Dressing or bathing? N  Doing errands, shopping? N  Preparing Food and eating ? N  Using the Toilet? N  In the past six months, have you accidently leaked urine? Y  Do you have problems with loss of bowel control? N  Managing your Medications? N  Managing your Finances? N  Housekeeping or managing your Housekeeping? N  Some recent data might be hidden     Cardiac Risk Factors include: advanced age (>44men, >54 women);dyslipidemia;family history of premature cardiovascular disease;hypertension;obesity (BMI >30kg/m2)  Depression Screen PHQ 2/9 Scores 04/23/2016 01/06/2016 10/06/2015 04/20/2015  PHQ - 2 Score 1 0 0 0     Fall Risk Fall Risk  04/23/2016 01/06/2016 10/06/2015 04/20/2015 04/04/2015  Falls in the past year? No No No No No  Number falls in past yr: - - - - -  Injury with Fall? - - - - -    Cognitive Function: MMSE - Mini Mental State Exam 04/23/2016 04/20/2015 04/07/2014 04/07/2014  Orientation to time 5 5 5 5   Orientation to Place 4 5 5 5   Registration 3 3 3 3   Attention/ Calculation 1 4 5 5   Recall 3 3 2 2   Language- name 2 objects 2 2 2 2   Language- repeat 1 1 1 1   Language- follow 3 step command 3 2 3 3   Language- read & follow direction 1 1 1  -  Write a sentence 1 0 1 -  Copy design 0 0 0 -   Total score 24 26 28  -    Immunizations and Health Maintenance Immunization History  Administered Date(s) Administered  . Influenza Split 10/13/2012  . Influenza, High Dose Seasonal PF 01/06/2016  . Influenza,inj,Quad PF,36+ Mos 10/01/2013, 10/04/2014  . Pneumococcal Conjugate-13 04/07/2014   Health Maintenance Due  Topic Date Due  . PNA vac Low Risk Adult (2 of 2 - PPSV23) 04/07/2015  . MAMMOGRAM  04/10/2016    Patient Care Team: Chevis Pretty, FNP as PCP - General (Nurse Practitioner) Particia Nearing, OD as Consulting Physician (  Optometry) Irine Seal, MD as Attending Physician (Urology)  Indicate any recent Medical Services you may have received from other than [facility] providers in the past year (date may be approximate).    Assessment:    Annual Wellness Visit  Cold intolerance OAB / medication management - current therapy not effective. Osteoporosis - patient would like to see if insurance would cover Prolia Obesity - great job with weight loss - 13# in 1 year!   Screening Tests Health Maintenance  Topic Date Due  . PNA vac Low Risk Adult (2 of 2 - PPSV23) 04/07/2015  . MAMMOGRAM  04/10/2016  . TETANUS/TDAP  07/05/2016 (Originally 01/02/2016)  . INFLUENZA VACCINE  08/01/2016  . DEXA SCAN  12/15/2016  . COLONOSCOPY  01/10/2022  . Hepatitis C Screening  Completed        Plan:   During the course of the visit Telina was educated and counseled about the following appropriate screening and preventive services:   Vaccines to include Pneumoccal, Influenza, Td and Shingles - discussed Boostrix and Shingrix - patient declined due to cost.   Colorectal cancer screening - UTD  Cardiovascular disease screening - EKG last 2016  BP improved with recent increase in dose - continue lisinopril 40mg  qd  Lipids - LDL is good but Tg is elevated   Diabetes screening - UTD and WNL  Bone Denisty / Osteoporosis Screening - due 12/15/2016 - future order placed  today  Will do insurance verification for Prolia (coverage denied by [employer] in past) - for now patient will continue risendronate EC 35mg  weeklys  Mammogram - ordered today - patient would like to get in 1-2 months  Nutrition counseling - continue to limit portions. Discussed healthy snack options (veggies, fruits, lean proteins) for between breakfast and lunch.    Advanced Directives - discussed and information packet given  Physical Activity -   Switch generic detrol LA to generic Enablex 7.5mg  ER take 1 tablet daily.  Orders Placed This Encounter  Procedures  . DG [facility] DEXA    Standing Status:   Future    Standing Expiration Date:   01/01/2017    Order Specific Question:   Reason for Exam (SYMPTOM  OR DIAGNOSIS REQUIRED)    Answer:   osteoporosis  . MM SCREENING BREAST TOMO BILATERAL    Standing Status:   Future    Standing Expiration Date:   06/23/2017    Order Specific Question:   Reason for Exam (SYMPTOM  OR DIAGNOSIS REQUIRED)    Answer:   screening    Order Specific Question:   Preferred imaging location?    Answer:   [facility]  . CBC with Differential/Platelet  . Thyroid Panel With TSH    Patient Instructions (the written plan) were given to the patient.   Cherre Robins, PharmD   04/23/2016

## 2016-04-23 NOTE — Patient Instructions (Addendum)
  Tracie Wiggins , Thank you for taking time to come for your Medicare Wellness Visit. I appreciate your ongoing commitment to your health goals. Please review the following plan we discussed and let me know if I can assist you in the future.   These are the goals we discussed:  Increase non-starchy vegetables - carrots, green bean, squash, zucchini, tomatoes, onions, peppers, spinach and other green leafy vegetables, cabbage, lettuce, cucumbers, asparagus, okra (not fried), eggplant Limit sugar and processed foods (cakes, cookies, ice cream, crackers and chips) Increase fresh fruit but limit serving sizes 1/2 cup or about the size of tennis or baseball Limit red meat to no more than 1-2 times per week (serving size about the size of your palm) Choose whole grains / lean proteins - whole wheat bread, quinoa, whole grain rice (1/2 cup), fish, chicken, [place] Avoid sugar and calorie containing beverages - soda, sweet tea and juice.  Choose water or unsweetened tea instead. Remember to choose low calorie snacks - fruit or vegetables, sugar free jello, popsicles.   Continue with weight loss - great job - you have lost 13# since last years visit.  Continue to stay active and exercise - this is great for your bones, muscles and can help preserve your memory.  Stop Detrol LA / tolterodine 4mg .  I have sent in an alternative to try.  Darifenacin ER 7.5mg   Take 1 tablet daily.   This is a list of the screening recommended for you and due dates:  Health Maintenance  Topic Date Due  . Mammogram  04/10/2016 - order [facility]  . Tetanus Vaccine  07/05/2016 - checked price today $45  . Flu Shot  08/01/2016  . DEXA scan (bone density measurement)  12/15/2016 - ordered today for 12/2016  .  Hepatitis C: One time screening is recommended by Center for Disease Control  (CDC) for  adults born from [birth year] through 1966.   Completed  *Topic was postponed. The date shown is not the original due date.

## 2016-04-24 ENCOUNTER — Encounter: Payer: Self-pay | Admitting: Pharmacist

## 2016-04-24 LAB — CBC WITH DIFFERENTIAL/PLATELET
Basophils Absolute: 0.1 10*3/uL (ref 0.0–0.2)
Basos: 1 %
EOS (ABSOLUTE): 0.3 10*3/uL (ref 0.0–0.4)
Eos: 3 %
Hematocrit: 38.4 % (ref 34.0–46.6)
Hemoglobin: 12.5 g/dL (ref 11.1–15.9)
Immature Grans (Abs): 0 10*3/uL (ref 0.0–0.1)
Immature Granulocytes: 0 %
Lymphocytes Absolute: 3.4 10*3/uL — ABNORMAL HIGH (ref 0.7–3.1)
Lymphs: 45 %
MCH: 30.6 pg (ref 26.6–33.0)
MCHC: 32.6 g/dL (ref 31.5–35.7)
MCV: 94 fL (ref 79–97)
Monocytes Absolute: 0.4 10*3/uL (ref 0.1–0.9)
Monocytes: 6 %
Neutrophils Absolute: 3.5 10*3/uL (ref 1.4–7.0)
Neutrophils: 45 %
Platelets: 283 10*3/uL (ref 150–379)
RBC: 4.09 x10E6/uL (ref 3.77–5.28)
RDW: 14.8 % (ref 12.3–15.4)
WBC: 7.6 10*3/uL (ref 3.4–10.8)

## 2016-04-24 LAB — THYROID PANEL WITH TSH
Free Thyroxine Index: 2.1 (ref 1.2–4.9)
T3 Uptake Ratio: 22 % — ABNORMAL LOW (ref 24–39)
T4, Total: 9.7 ug/dL (ref 4.5–12.0)
TSH: 1.88 u[IU]/mL (ref 0.450–4.500)

## 2016-05-21 ENCOUNTER — Other Ambulatory Visit: Payer: Self-pay | Admitting: Nurse Practitioner

## 2016-05-21 DIAGNOSIS — Z1231 Encounter for screening mammogram for malignant neoplasm of breast: Secondary | ICD-10-CM

## 2016-05-21 DIAGNOSIS — K219 Gastro-esophageal reflux disease without esophagitis: Secondary | ICD-10-CM

## 2016-06-04 ENCOUNTER — Ambulatory Visit (HOSPITAL_COMMUNITY)
Admission: RE | Admit: 2016-06-04 | Discharge: 2016-06-04 | Disposition: A | Discharge status: Home / Self Care | Payer: PPO | Source: Ambulatory Visit | Attending: Nurse Practitioner | Admitting: Nurse Practitioner

## 2016-06-04 DIAGNOSIS — Z1231 Encounter for screening mammogram for malignant neoplasm of breast: Secondary | ICD-10-CM

## 2016-06-18 DIAGNOSIS — H40053 Ocular hypertension, bilateral: Secondary | ICD-10-CM | POA: Diagnosis not present

## 2016-06-20 ENCOUNTER — Other Ambulatory Visit: Payer: Self-pay | Admitting: Nurse Practitioner

## 2016-07-05 ENCOUNTER — Ambulatory Visit (INDEPENDENT_AMBULATORY_CARE_PROVIDER_SITE_OTHER): Payer: PPO | Admitting: Nurse Practitioner

## 2016-07-05 ENCOUNTER — Encounter: Payer: Self-pay | Admitting: Nurse Practitioner

## 2016-07-05 VITALS — BP 127/76 | HR 76 | Temp 97.9°F | Ht 62.0 in | Wt 228.0 lb

## 2016-07-05 DIAGNOSIS — E785 Hyperlipidemia, unspecified: Secondary | ICD-10-CM | POA: Diagnosis not present

## 2016-07-05 DIAGNOSIS — F339 Major depressive disorder, recurrent, unspecified: Secondary | ICD-10-CM | POA: Diagnosis not present

## 2016-07-05 DIAGNOSIS — M810 Age-related osteoporosis without current pathological fracture: Secondary | ICD-10-CM | POA: Diagnosis not present

## 2016-07-05 DIAGNOSIS — I10 Essential (primary) hypertension: Secondary | ICD-10-CM | POA: Diagnosis not present

## 2016-07-05 DIAGNOSIS — N3281 Overactive bladder: Secondary | ICD-10-CM | POA: Diagnosis not present

## 2016-07-05 DIAGNOSIS — K219 Gastro-esophageal reflux disease without esophagitis: Secondary | ICD-10-CM | POA: Diagnosis not present

## 2016-07-05 MED ORDER — SERTRALINE HCL 25 MG PO TABS: 25.0000 mg | ORAL_TABLET | Freq: Every day | ORAL | 1 refills | Status: DC

## 2016-07-05 MED ORDER — DARIFENACIN HYDROBROMIDE ER 7.5 MG PO TB24: 7.5000 mg | ORAL_TABLET | Freq: Every day | ORAL | 1 refills | Status: DC

## 2016-07-05 MED ORDER — OMEPRAZOLE 40 MG PO CPDR: DELAYED_RELEASE_CAPSULE | ORAL | 1 refills | Status: DC

## 2016-07-05 MED ORDER — FISH OIL 1000 MG PO CAPS: 2.0000 | ORAL_CAPSULE | Freq: Every day | ORAL | 5 refills | Status: DC

## 2016-07-05 MED ORDER — LISINOPRIL 40 MG PO TABS: 40.0000 mg | ORAL_TABLET | Freq: Every day | ORAL | 1 refills | Status: DC

## 2016-07-05 MED ORDER — ATORVASTATIN CALCIUM 10 MG PO TABS: 10.0000 mg | ORAL_TABLET | Freq: Every day | ORAL | 1 refills | Status: DC

## 2016-07-05 NOTE — Progress Notes (Signed)
Subjective:    Patient ID: Tracie Wiggins, female    DOB: December 06, 1945, 71 y.o.   MRN: [id number]  HPI  ZARINAH OVIATT is here today for follow up of chronic medical problem.  Outpatient Encounter Prescriptions as of 07/05/2016  Medication Sig  . atorvastatin (LIPITOR) 10 MG tablet Take 1 tablet (10 mg total) by mouth daily.  [name] darifenacin (ENABLEX) 7.5 MG 24 hr tablet Take 1 tablet (7.5 mg total) by mouth daily.  . Incontinence Supply Disposable (DEPEND SILHOUETTE BRIEFS L/XL) MISC 1 each by Does not apply route 3 (three) times daily.  . Incontinence Supply Disposable (POISE HOURGLASS SHAPE) PADS 1 each by Does not apply route 4 (four) times daily - after meals and at bedtime.  [name] lisinopril (PRINIVIL,ZESTRIL) 40 MG tablet Take 1 tablet (40 mg total) by mouth daily.  . Omega-3 Fatty Acids (FISH OIL) 1000 MG CAPS Take 2 capsules by mouth daily.   [name] omeprazole (PRILOSEC) 40 MG capsule TAKE (1) CAPSULE DAILY  . Risedronate Sodium 35 MG TBEC Take 1 tablet (35 mg total) by mouth once a week.  . sertraline (ZOLOFT) 25 MG tablet Take 1 tablet (25 mg total) by mouth daily.   No facility-administered encounter medications on file as of 07/05/2016.     1. Essential hypertension  No c/o chest pain,sob or headaches. Does not check blood pressures at home  2. Gastroesophageal reflux disease without esophagitis  Omeprazole works good to keep symptoms under control  3. Age-related osteoporosis without current pathological fracture  Does very little weight bearing exercises. dexa has been ordered but has nit been done as of yet.  4. Morbid obesity ([place])  No recent weight changes  5. Hyperlipidemia, unspecified hyperlipidemia type  Really does not watch diet  6. Recurrent major depressive disorder, remission status unspecified ([place])  is currently on zoloft- no c/o side effects  7. OAB (overactive bladder)  Patient is on enablex- works well.    New complaints: none today     Review of  Systems  Constitutional: Negative for activity change and appetite change.  HENT: Negative.   Eyes: Negative for pain.  Respiratory: Negative for shortness of breath.   Cardiovascular: Negative for chest pain, palpitations and leg swelling.  Gastrointestinal: Negative for abdominal pain.  Endocrine: Negative for polydipsia.  Genitourinary: Negative.   Skin: Negative for rash.  Neurological: Negative for dizziness, weakness and headaches.  Hematological: Does not bruise/bleed easily.  Psychiatric/Behavioral: Negative.   All other systems reviewed and are negative.      Objective:   Physical Exam  Constitutional: She is oriented to person, place, and time. She appears well-developed and well-nourished.  HENT:  Nose: Nose normal.  Mouth/Throat: Oropharynx is clear and moist.  Eyes: EOM are normal.  Neck: Trachea normal, normal range of motion and full passive range of motion without pain. Neck supple. No JVD present. Carotid bruit is not present. No thyromegaly present.  Cardiovascular: Normal rate, regular rhythm, normal heart sounds and intact distal pulses.  Exam reveals no gallop and no friction rub.   No murmur heard. Pulmonary/Chest: Effort normal and breath sounds normal.  Abdominal: Soft. Bowel sounds are normal. She exhibits no distension and no mass. There is no tenderness.  Musculoskeletal: Normal range of motion. She exhibits edema (1+ edema bil lower ext).  Lymphadenopathy:    She has no cervical adenopathy.  Neurological: She is alert and oriented to person, place, and time. She has normal reflexes.  Skin: Skin is  warm and dry.  Psychiatric: She has a normal mood and affect. Her behavior is normal. Judgment and thought content normal.    BP 127/76   Pulse 76   Temp 97.9 F (36.6 C) (Oral)   Ht _0  (1.575 m)   Wt 228 lb (103.4 kg)   BMI 41.70 kg/m        Assessment & Plan:  1. Essential hypertension Low sodium diet - lisinopril (PRINIVIL,ZESTRIL) 40 MG  tablet; Take 1 tablet (40 mg total) by mouth daily.  Dispense: 90 tablet; Refill: 1 - CMP14+EGFR  2. Gastroesophageal reflux disease without esophagitis Avoid spicy foods Do not eat 2 hours prior to bedtime - omeprazole (PRILOSEC) 40 MG capsule; TAKE (1) CAPSULE DAILY  Dispense: 90 capsule; Refill: 1  3. Age-related osteoporosis without current pathological fracture Weight bearing exercises  4. Morbid obesity ([place]) Discussed diet and exercise for person with BMI >25 Will recheck weight in 3-6 months  5. Hyperlipidemia, unspecified hyperlipidemia type Low fat diet - atorvastatin (LIPITOR) 10 MG tablet; Take 1 tablet (10 mg total) by mouth daily.  Dispense: 90 tablet; Refill: 1 - Omega-3 Fatty Acids (FISH OIL) 1000 MG CAPS; Take 2 capsules (2,000 mg total) by mouth daily.  Dispense: 30 capsule; Refill: 5 - Lipid panel  6. Recurrent major depressive disorder, remission status unspecified (HCC) stress management - sertraline (ZOLOFT) 25 MG tablet; Take 1 tablet (25 mg total) by mouth daily.  Dispense: 90 tablet; Refill: 1  7. OAB (overactive bladder) - darifenacin (ENABLEX) 7.5 MG 24 hr tablet; Take 1 tablet (7.5 mg total) by mouth daily.  Dispense: 90 tablet; Refill: 1    Labs pending Health maintenance reviewed Diet and exercise encouraged Continue all meds Follow up  In 6 months   [place], FNP

## 2016-07-05 NOTE — Patient Instructions (Signed)
Bone Health Bones protect organs, store calcium, and anchor muscles. Good health habits, such as eating nutritious foods and exercising regularly, are important for maintaining healthy bones. They can also help to prevent a condition that causes bones to lose density and become weak and brittle (osteoporosis). Why is bone mass important? Bone mass refers to the amount of bone tissue that you have. The higher your bone mass, the stronger your bones. An important step toward having healthy bones throughout life is to have strong and dense bones during childhood. A young adult who has a high bone mass is more likely to have a high bone mass later in life. Bone mass at its greatest it is called peak bone mass. A large decline in bone mass occurs in older adults. In women, it occurs about the time of menopause. During this time, it is important to practice good health habits, because if more bone is lost than what is replaced, the bones will become less healthy and more likely to break (fracture). If you find that you have a low bone mass, you may be able to prevent osteoporosis or further bone loss by changing your diet and lifestyle. How can I find out if my bone mass is low? Bone mass can be measured with an X-ray test that is called a bone mineral density (BMD) test. This test is recommended for all women who are age 65 or older. It may also be recommended for men who are age 70 or older, or for people who are more likely to develop osteoporosis due to:  Having bones that break easily.  Having a long-term disease that weakens bones, such as kidney disease or rheumatoid arthritis.  Having menopause earlier than normal.  Taking medicine that weakens bones, such as steroids, thyroid hormones, or hormone treatment for breast cancer or prostate cancer.  Smoking.  Drinking three or more alcoholic drinks each day.  What are the nutritional recommendations for healthy bones? To have healthy bones, you  need to get enough of the right minerals and vitamins. Most nutrition experts recommend getting these nutrients from the foods that you eat. Nutritional recommendations vary from person to person. Ask your health care provider what is healthy for you. Here are some general guidelines. Calcium Recommendations Calcium is the most important (essential) mineral for bone health. Most people can get enough calcium from their diet, but supplements may be recommended for people who are at risk for osteoporosis. Good sources of calcium include:  Dairy products, such as low-fat or nonfat milk, cheese, and yogurt.  Dark green leafy vegetables, such as bok choy and broccoli.  Calcium-fortified foods, such as orange juice, cereal, bread, soy beverages, and tofu products.  Nuts, such as almonds.  Follow these recommended amounts for daily calcium intake:  Children, age 1?3: 700 mg.  Children, age 4?8: 1,000 mg.  Children, age 9?13: 1,300 mg.  Teens, age 14?18: 1,300 mg.  Adults, age 19?50: 1,000 mg.  Adults, age 51?70: ? Men: 1,000 mg. ? Women: 1,200 mg.  Adults, age 71 or older: 1,200 mg.  Pregnant and breastfeeding females: ? Teens: 1,300 mg. ? Adults: 1,000 mg.  Vitamin D Recommendations Vitamin D is the most essential vitamin for bone health. It helps the body to absorb calcium. Sunlight stimulates the skin to make vitamin D, so be sure to get enough sunlight. If you live in a cold climate or you do not get outside often, your health care provider may recommend that you take vitamin   D supplements. Good sources of vitamin D in your diet include:  Egg yolks.  Saltwater fish.  Milk and cereal fortified with vitamin D.  Follow these recommended amounts for daily vitamin D intake:  Children and teens, age 1?18: 600 international units.  Adults, age 50 or younger: 400-800 international units.  Adults, age 51 or older: 800-1,000 international units.  Other Nutrients Other nutrients  for bone health include:  Phosphorus. This mineral is found in meat, poultry, dairy foods, nuts, and legumes. The recommended daily intake for adult men and adult women is 700 mg.  Magnesium. This mineral is found in seeds, nuts, dark green vegetables, and legumes. The recommended daily intake for adult men is 400?420 mg. For adult women, it is 310?320 mg.  Vitamin K. This vitamin is found in green leafy vegetables. The recommended daily intake is 120 mg for adult men and 90 mg for adult women.  What type of physical activity is best for building and maintaining healthy bones? Weight-bearing and strength-building activities are important for building and maintaining peak bone mass. Weight-bearing activities cause muscles and bones to work against gravity. Strength-building activities increases muscle strength that supports bones. Weight-bearing and muscle-building activities include:  Walking and hiking.  Jogging and running.  Dancing.  Gym exercises.  Lifting weights.  Tennis and racquetball.  Climbing stairs.  Aerobics.  Adults should get at least 30 minutes of moderate physical activity on most days. Children should get at least 60 minutes of moderate physical activity on most days. Ask your health care provide what type of exercise is best for you. Where can I find more information? For more information, check out the following websites:  National Osteoporosis Foundation: http://nof.org/learn/basics  National Institutes of Health: http://www.niams.nih.gov/Health_Info/Bone/Bone_Health/bone_health_for_life.asp  This information is not intended to replace advice given to you by your health care provider. Make sure you discuss any questions you have with your health care provider. Document Released: 03/10/2003 Document Revised: 07/08/2015 Document Reviewed: 12/23/2013 Elsevier Interactive Patient Education  2018 Elsevier Inc.  

## 2016-07-06 LAB — CMP14+EGFR
ALT: 17 IU/L (ref 0–32)
AST: 22 IU/L (ref 0–40)
Albumin/Globulin Ratio: 1.7 (ref 1.2–2.2)
Albumin: 4 g/dL (ref 3.5–4.8)
Alkaline Phosphatase: 98 IU/L (ref 39–117)
BUN/Creatinine Ratio: 8 — ABNORMAL LOW (ref 12–28)
BUN: 8 mg/dL (ref 8–27)
Bilirubin Total: 0.4 mg/dL (ref 0.0–1.2)
CO2: 25 mmol/L (ref 20–29)
Calcium: 9.7 mg/dL (ref 8.7–10.3)
Chloride: 100 mmol/L (ref 96–106)
Creatinine, Ser: 1.03 mg/dL — ABNORMAL HIGH (ref 0.57–1.00)
GFR calc Af Amer: 63 mL/min/{1.73_m2} (ref >59)
GFR calc non Af Amer: 54 mL/min/{1.73_m2} — ABNORMAL LOW (ref >59)
Globulin, Total: 2.3 g/dL (ref 1.5–4.5)
Glucose: 76 mg/dL (ref 65–99)
Potassium: 4.5 mmol/L (ref 3.5–5.2)
Sodium: 140 mmol/L (ref 134–144)
Total Protein: 6.3 g/dL (ref 6.0–8.5)

## 2016-07-06 LAB — LIPID PANEL
Chol/HDL Ratio: 3.1 ratio (ref 0.0–4.4)
Cholesterol, Total: 171 mg/dL (ref 100–199)
HDL: 55 mg/dL (ref >39)
LDL Calculated: 81 mg/dL (ref 0–99)
Triglycerides: 174 mg/dL — ABNORMAL HIGH (ref 0–149)
VLDL Cholesterol Cal: 35 mg/dL (ref 5–40)

## 2016-07-12 DIAGNOSIS — H40033 Anatomical narrow angle, bilateral: Secondary | ICD-10-CM | POA: Diagnosis not present

## 2016-07-12 DIAGNOSIS — H25013 Cortical age-related cataract, bilateral: Secondary | ICD-10-CM | POA: Diagnosis not present

## 2016-07-13 DIAGNOSIS — H25012 Cortical age-related cataract, left eye: Secondary | ICD-10-CM | POA: Diagnosis not present

## 2016-07-17 ENCOUNTER — Encounter (HOSPITAL_COMMUNITY): Admission: RE | Admit: 2016-07-17 | Payer: PPO | Source: Ambulatory Visit

## 2016-07-18 ENCOUNTER — Encounter (HOSPITAL_COMMUNITY): Payer: Self-pay

## 2016-07-18 ENCOUNTER — Encounter (HOSPITAL_COMMUNITY)
Admission: RE | Admit: 2016-07-18 | Discharge: 2016-07-18 | Disposition: A | Discharge status: Home / Self Care | Payer: PPO | Source: Ambulatory Visit | Attending: Ophthalmology | Admitting: Ophthalmology

## 2016-07-18 DIAGNOSIS — F418 Other specified anxiety disorders: Secondary | ICD-10-CM | POA: Diagnosis not present

## 2016-07-18 DIAGNOSIS — E7800 Pure hypercholesterolemia, unspecified: Secondary | ICD-10-CM | POA: Diagnosis not present

## 2016-07-18 DIAGNOSIS — Z791 Long term (current) use of non-steroidal anti-inflammatories (NSAID): Secondary | ICD-10-CM | POA: Diagnosis not present

## 2016-07-18 DIAGNOSIS — Z6841 Body Mass Index (BMI) 40.0 and over, adult: Secondary | ICD-10-CM | POA: Diagnosis not present

## 2016-07-18 DIAGNOSIS — Z79899 Other long term (current) drug therapy: Secondary | ICD-10-CM | POA: Diagnosis not present

## 2016-07-18 DIAGNOSIS — K219 Gastro-esophageal reflux disease without esophagitis: Secondary | ICD-10-CM | POA: Diagnosis not present

## 2016-07-18 DIAGNOSIS — H269 Unspecified cataract: Secondary | ICD-10-CM | POA: Diagnosis not present

## 2016-07-18 DIAGNOSIS — I10 Essential (primary) hypertension: Secondary | ICD-10-CM | POA: Diagnosis not present

## 2016-07-18 HISTORY — DX: Headache, unspecified: R51.9

## 2016-07-18 HISTORY — DX: Unspecified osteoarthritis, unspecified site: M19.90

## 2016-07-18 HISTORY — DX: Headache: R51

## 2016-07-18 NOTE — Patient Instructions (Signed)
Tracie Wiggins  07/18/2016     @PREFPERIOPPHARMACY @   Your procedure is scheduled on 07/20/2016.  Report to [facility] at 8:50 A.M.  Call this number if you have problems the morning of surgery:  [phone]   Remember:  Do not eat food or drink liquids after midnight.  Take these medicines the morning of surgery with A SIP OF WATER Enablex, Lisinopril, Claritin, Prilosec, Zoloft   Do not wear jewelry, make-up or nail polish.  Do not wear lotions, powders, or perfumes, or deoderant.  Do not shave 48 hours prior to surgery.  Men may shave face and neck.  Do not bring valuables to the hospital.  [facility] is not responsible for any belongings or valuables.  Contacts, dentures or bridgework may not be worn into surgery.  Leave your suitcase in the car.  After surgery it may be brought to your room.  For patients admitted to the hospital, discharge time will be determined by your treatment team.  Patients discharged the day of surgery will not be allowed to drive home.   Please read over the following fact sheets that you were given. Anesthesia Post-op Instructions     PATIENT INSTRUCTIONS POST-ANESTHESIA  IMMEDIATELY FOLLOWING SURGERY:  Do not drive or operate machinery for the first twenty four hours after surgery.  Do not make any important decisions for twenty four hours after surgery or while taking narcotic pain medications or sedatives.  If you develop intractable nausea and vomiting or a severe headache please notify your doctor immediately.  FOLLOW-UP:  Please make an appointment with your surgeon as instructed. You do not need to follow up with anesthesia unless specifically instructed to do so.  WOUND CARE INSTRUCTIONS (if applicable):  Keep a dry clean dressing on the anesthesia/puncture wound site if there is drainage.  Once the wound has quit draining you may leave it open to air.  Generally you should leave the bandage intact for twenty four hours unless  there is drainage.  If the epidural site drains for more than 36-48 hours please call the anesthesia department.  QUESTIONS?:  Please feel free to call your physician or the hospital operator if you have any questions, and they will be happy to assist you.      Cataract Surgery Cataract surgery is a procedure to remove a cataract from your eye. A cataract is cloudiness on the lens of your eye. The lens focuses light inside the eye. When a lens becomes cloudy, your vision is affected. Cataract surgery is a procedure to remove the cloudy lens. A substitute lens (intraocular lens or IOL) is usually inserted as a replacement for the cloudy lens. Tell a health care provider about:  Any allergies you have.  All medicines you are taking, including vitamins, herbs, eye drops, creams, and over-the-counter medicines.  Any problems you or family members have had with anesthetic medicines.  Any blood disorders you have.  Any surgeries you have had, especially eye surgeries that include refractive surgery, such as PRK and LASIK.  Any medical conditions you have.  Whether you are pregnant or may be pregnant. What are the risks? Generally, this is a safe procedure. However, problems may occur, including:  Infection.  Bleeding.  Glaucoma.  Retinal detachment.  Allergic reactions to medicines.  Damage to other structures or organs.  Inflammation of the eye.  Clouding of the part of your eye that holds an IOL in place (after-cataract), if an IOL was inserted. This is  fairly common.  An IOL moving out of position, if an IOL was inserted. This is very rare.  Loss of vision. This is rare.  What happens before the procedure?  Follow instructions from your health care provider about eating or drinking restrictions.  Ask your health care provider about: ? Changing or stopping your regular medicines, including any eye drops you have been prescribed. This is especially important if you are  taking diabetes medicines or blood thinners. ? Taking medicines such as aspirin and ibuprofen. These medicines can thin your blood. Do not take these medicines before your procedure if your health care provider instructs you not to.  Do not put contact lenses in either eye on the day of your surgery.  Plan for someone to drive you to and from the procedure.  If you will be going home right after the procedure, plan to have someone with you for 24 hours. What happens during the procedure?  An IV tube may be inserted into one of your veins.  You will be given one or more of the following: ? A medicine to help you relax (sedative). ? A medicine to numb the area (local anesthetic). This may be numbing eye drops or an injection that is given behind the eye.  A small cut (incision) will be made to the edge of the clear, dome-shaped surface that covers the front of the eye (cornea).  A small probe will be inserted into the eye. This device gives off ultrasound waves that soften and break up the cloudy center of the lens. This makes it easier for the cloudy lens to be removed by suction.  An IOL may be implanted.  Part of the capsule that surrounds the lens will be left in the eye to support the IOL.  Your surgeon may use stitches (sutures) to close the incision. The procedure may vary among health care providers and hospitals. What happens after the procedure?  Your blood pressure, heart rate, breathing rate, and blood oxygen level will be monitored often until the medicines you were given have worn off.  You may be given a protective shield to wear over your eyes.  Do not drive for 24 hours if you received a sedative. This information is not intended to replace advice given to you by your health care provider. Make sure you discuss any questions you have with your health care provider. Document Released: 12/07/2010 Document Revised: 05/26/2015 Document Reviewed: 10/28/2014 Elsevier  Interactive Patient Education  2017 [employer].

## 2016-07-20 ENCOUNTER — Ambulatory Visit (HOSPITAL_COMMUNITY)
Admission: RE | Admit: 2016-07-20 | Discharge: 2016-07-20 | Disposition: A | Discharge status: Home / Self Care | Payer: PPO | Source: Ambulatory Visit | Attending: Ophthalmology | Admitting: Ophthalmology

## 2016-07-20 ENCOUNTER — Encounter (HOSPITAL_COMMUNITY)
Admission: RE | Disposition: A | Discharge status: Home / Self Care | Payer: Self-pay | Source: Ambulatory Visit | Attending: Ophthalmology

## 2016-07-20 ENCOUNTER — Ambulatory Visit (HOSPITAL_COMMUNITY): Payer: PPO | Admitting: Anesthesiology

## 2016-07-20 DIAGNOSIS — Z791 Long term (current) use of non-steroidal anti-inflammatories (NSAID): Secondary | ICD-10-CM | POA: Insufficient documentation

## 2016-07-20 DIAGNOSIS — E7800 Pure hypercholesterolemia, unspecified: Secondary | ICD-10-CM | POA: Diagnosis not present

## 2016-07-20 DIAGNOSIS — F418 Other specified anxiety disorders: Secondary | ICD-10-CM | POA: Diagnosis not present

## 2016-07-20 DIAGNOSIS — H25012 Cortical age-related cataract, left eye: Secondary | ICD-10-CM | POA: Diagnosis not present

## 2016-07-20 DIAGNOSIS — Z79899 Other long term (current) drug therapy: Secondary | ICD-10-CM | POA: Diagnosis not present

## 2016-07-20 DIAGNOSIS — K219 Gastro-esophageal reflux disease without esophagitis: Secondary | ICD-10-CM | POA: Insufficient documentation

## 2016-07-20 DIAGNOSIS — H269 Unspecified cataract: Secondary | ICD-10-CM | POA: Insufficient documentation

## 2016-07-20 DIAGNOSIS — Z6841 Body Mass Index (BMI) 40.0 and over, adult: Secondary | ICD-10-CM | POA: Insufficient documentation

## 2016-07-20 DIAGNOSIS — I10 Essential (primary) hypertension: Secondary | ICD-10-CM | POA: Diagnosis not present

## 2016-07-20 HISTORY — PX: CATARACT EXTRACTION W/PHACO: SHX586

## 2016-07-20 SURGERY — PHACOEMULSIFICATION, CATARACT, WITH IOL INSERTION
Anesthesia: Monitor Anesthesia Care | Site: Eye | Laterality: Left

## 2016-07-20 MED ORDER — PHENYLEPHRINE HCL 2.5 % OP SOLN
1.0000 [drp] | OPHTHALMIC | Status: AC
  Administered 2016-07-20 (×3): 1 [drp] via OPHTHALMIC

## 2016-07-20 MED ORDER — SODIUM HYALURONATE 23 MG/ML IO SOLN
INTRAOCULAR | Status: DC | PRN
  Administered 2016-07-20: 0.6 mL via INTRAOCULAR

## 2016-07-20 MED ORDER — POVIDONE-IODINE 5 % OP SOLN
OPHTHALMIC | Status: DC | PRN
  Administered 2016-07-20: 1 via OPHTHALMIC

## 2016-07-20 MED ORDER — FENTANYL CITRATE (PF) 100 MCG/2ML IJ SOLN
INTRAMUSCULAR | Status: AC
  Filled 2016-07-20: qty 2

## 2016-07-20 MED ORDER — MIDAZOLAM HCL 2 MG/2ML IJ SOLN
1.0000 mg | INTRAMUSCULAR | Status: AC
  Administered 2016-07-20: 2 mg via INTRAVENOUS

## 2016-07-20 MED ORDER — BSS IO SOLN
INTRAOCULAR | Status: DC | PRN
  Administered 2016-07-20: 15 mL via INTRAOCULAR

## 2016-07-20 MED ORDER — MIDAZOLAM HCL 2 MG/2ML IJ SOLN
INTRAMUSCULAR | Status: DC | PRN
Start: 2016-07-20 — End: 2016-07-20
  Administered 2016-07-20: 1 mg via INTRAVENOUS

## 2016-07-20 MED ORDER — LACTATED RINGERS IV SOLN
INTRAVENOUS | Status: DC
  Administered 2016-07-20: 1000 mL via INTRAVENOUS

## 2016-07-20 MED ORDER — EPINEPHRINE PF 1 MG/ML IJ SOLN
INTRAOCULAR | Status: DC | PRN
  Administered 2016-07-20: 1 mL via OPHTHALMIC

## 2016-07-20 MED ORDER — FENTANYL CITRATE (PF) 100 MCG/2ML IJ SOLN
25.0000 ug | Freq: Once | INTRAMUSCULAR | Status: AC
  Administered 2016-07-20: 25 ug via INTRAVENOUS

## 2016-07-20 MED ORDER — EPINEPHRINE PF 1 MG/ML IJ SOLN
INTRAOCULAR | Status: DC | PRN
  Administered 2016-07-20: 500 mL

## 2016-07-20 MED ORDER — PROVISC 10 MG/ML IO SOLN
INTRAOCULAR | Status: DC | PRN
  Administered 2016-07-20: 0.85 mL via INTRAOCULAR

## 2016-07-20 MED ORDER — TETRACAINE HCL 0.5 % OP SOLN
1.0000 [drp] | OPHTHALMIC | Status: AC
  Administered 2016-07-20 (×3): 1 [drp] via OPHTHALMIC

## 2016-07-20 MED ORDER — LIDOCAINE HCL 3.5 % OP GEL
1.0000 "application " | Freq: Once | OPHTHALMIC | Status: AC
  Administered 2016-07-20: 1 via OPHTHALMIC

## 2016-07-20 MED ORDER — CYCLOPENTOLATE-PHENYLEPHRINE 0.2-1 % OP SOLN
1.0000 [drp] | OPHTHALMIC | Status: AC
  Administered 2016-07-20 (×3): 1 [drp] via OPHTHALMIC

## 2016-07-20 MED ORDER — MIDAZOLAM HCL 2 MG/2ML IJ SOLN
INTRAMUSCULAR | Status: AC
  Filled 2016-07-20: qty 2

## 2016-07-20 SURGICAL SUPPLY — 12 items
CLOTH BEACON ORANGE TIMEOUT ST (SAFETY) ×2 IMPLANT
EYE SHIELD UNIVERSAL CLEAR (GAUZE/BANDAGES/DRESSINGS) ×2 IMPLANT
GLOVE BIOGEL PI IND STRL 7.0 (GLOVE) ×2 IMPLANT
GLOVE BIOGEL PI INDICATOR 7.0 (GLOVE) ×2
LENS ALC ACRYL/TECN (Ophthalmic Related) ×2 IMPLANT
NEEDLE HYPO 18GX1.5 BLUNT FILL (NEEDLE) ×2 IMPLANT
PAD ARMBOARD 7.5X6 YLW CONV (MISCELLANEOUS) ×2 IMPLANT
SYR TB 1ML LL NO SAFETY (SYRINGE) ×2 IMPLANT
TAPE SURG TRANSPORE 1 IN (GAUZE/BANDAGES/DRESSINGS) ×1 IMPLANT
TAPE SURGICAL TRANSPORE 1 IN (GAUZE/BANDAGES/DRESSINGS) ×1
VISCOELASTIC ADDITIONAL (OPHTHALMIC RELATED) ×2 IMPLANT
WATER STERILE IRR 250ML POUR (IV SOLUTION) ×2 IMPLANT

## 2016-07-20 NOTE — Anesthesia Preprocedure Evaluation (Signed)
Anesthesia Evaluation  Patient identified by MRN, date of birth, ID band Patient awake    Reviewed: Allergy & Precautions, NPO status , Patient's Chart, lab work & pertinent test results  Airway Mallampati: II  TM Distance: >3 FB Neck ROM: Full    Dental  (+) Teeth Intact   Pulmonary neg pulmonary ROS,    breath sounds clear to auscultation       Cardiovascular hypertension, Pt. on medications  Rhythm:Regular Rate:Normal     Neuro/Psych  Headaches, PSYCHIATRIC DISORDERS Anxiety Depression    GI/Hepatic GERD  ,  Endo/Other  Morbid obesity  Renal/GU      Musculoskeletal  (+) Arthritis ,   Abdominal   Peds  Hematology   Anesthesia Other Findings   Reproductive/Obstetrics                             Anesthesia Physical Anesthesia Plan  ASA: II  Anesthesia Plan: MAC   Post-op Pain Management:    Induction: Intravenous  PONV Risk Score and Plan:   Airway Management Planned: Nasal Cannula  Additional Equipment:   Intra-op Plan:   Post-operative Plan:   Informed Consent: I have reviewed the patients History and Physical, chart, labs and discussed the procedure including the risks, benefits and alternatives for the proposed anesthesia with the patient or authorized representative who has indicated his/her understanding and acceptance.     Plan Discussed with:   Anesthesia Plan Comments:         Anesthesia Quick Evaluation

## 2016-07-20 NOTE — Discharge Instructions (Signed)
PATIENT INSTRUCTIONS °POST-ANESTHESIA ° °IMMEDIATELY FOLLOWING SURGERY:  Do not drive or operate machinery for the first twenty four hours after surgery.  Do not make any important decisions for twenty four hours after surgery or while taking narcotic pain medications or sedatives.  If you develop intractable nausea and vomiting or a severe headache please notify your doctor immediately. ° °FOLLOW-UP:  Please make an appointment with your surgeon as instructed. You do not need to follow up with anesthesia unless specifically instructed to do so. ° °WOUND CARE INSTRUCTIONS (if applicable):  Keep a dry clean dressing on the anesthesia/puncture wound site if there is drainage.  Once the wound has quit draining you may leave it open to air.  Generally you should leave the bandage intact for twenty four hours unless there is drainage.  If the epidural site drains for more than 36-48 hours please call the anesthesia department. ° °QUESTIONS?:  Please feel free to call your physician or the hospital operator if you have any questions, and they will be happy to assist you.    ° ° °Please discharge patient when stable, will follow up today with Dr. Wrzosek at the [facility] office directly after leaving hospital.  Leave shield in place until visit.  All paperwork with discharge instructions will be given at the office. °

## 2016-07-20 NOTE — Anesthesia Procedure Notes (Signed)
Procedure Name: MAC Date/Time: 07/20/2016 8:34 AM Performed by: Vista Deck Pre-anesthesia Checklist: Patient identified, Emergency Drugs available, Suction available, Timeout performed and Patient being monitored Patient Re-evaluated:Patient Re-evaluated prior to induction Oxygen Delivery Method: Nasal Cannula

## 2016-07-20 NOTE — H&P (Signed)
The H and P was reviewed and updated. The patient was examined.  No changes were found after exam.  The surgical eye was marked.  

## 2016-07-20 NOTE — Op Note (Signed)
Date of procedure: 07/20/16  Pre-operative diagnosis: Visually significant cataract, Left Eye  Post-operative diagnosis: Visually significant cataract, Left Eye  Procedure: Removal of cataract via phacoemulsification and insertion of intra-ocular lens AMO PCB00  +23.0D into the capsular bag of the Left Eye  Attending surgeon: Gerda Diss. Aquinnah Devin, MD, MA  Anesthesia: MAC, Topical Akten  Complications: None  Estimated Blood Loss: <55m (minimal)  Specimens: None  Implants: As above  Indications:  Visually significant cataract, Left Eye  Procedure:  The patient was seen and identified in the pre-operative area. The operative eye was identified and dilated.  The operative eye was marked.  Topical anesthesia was administered to the operative eye.     The patient was then to the operative suite and placed in the supine position.  A timeout was performed confirming the patient, procedure to be performed, and all other relevant information.   The patient's face was prepped and draped in the usual fashion for intra-ocular surgery.  A lid speculum was placed into the operative eye and the surgical microscope moved into place and focused.  A superotemporal paracentesis was created using a 20 gauge paracentesis blade.  Shugarcaine was injected into the anterior chamber.  Viscoelastic was injected into the anterior chamber.  A temporal clear-corneal main wound incision was created using a 2.487mmicrokeratome.  A continuous curvilinear capsulorrhexis was initiated using an irrigating cystitome and completed using capsulorrhexis forceps.  Hydrodissection and hydrodeliniation were performed.  Viscoelastic was injected into the anterior chamber.  A phacoemulsification handpiece and a chopper as a second instrument were used to remove the nucleus and epinucleus. The irrigation/aspiration handpiece was used to remove any remaining cortical material.   The capsular bag was reinflated with viscoelastic, checked,  and found to be intact.  The intraocular lens was inserted into the capsular bag and dialed into place using a Kuglen hook.  The irrigation/aspiration handpiece was used to remove any remaining viscoelastic.  The clear corneal wound and paracentesis wounds were then hydrated and checked with Weck-Cels to be watertight.  The lid-speculum and drape was removed, and the patient's face was cleaned with a wet and dry 4x4.  Maxitrol was instilled in the eye before a clear shield was taped over the eye. The patient was taken to the post-operative care unit in good condition, having tolerated the procedure well.  Post-Op Instructions: [facility] and will receive all other orders and instructions.

## 2016-07-20 NOTE — Transfer of Care (Signed)
Immediate Anesthesia Transfer of Care Note  Patient: ERIE SICA  Procedure(s) Performed: Procedure(s) with comments: CATARACT EXTRACTION PHACO AND INTRAOCULAR LENS PLACEMENT (IOC) (Left) - CDE: 3.19  Patient Location: Short Stay  Anesthesia Type:MAC  Level of Consciousness: awake and alert   Airway & Oxygen Therapy: Patient Spontanous Breathing  Post-op Assessment: Report given to RN and Post -op Vital signs reviewed and stable  Post vital signs: Reviewed and stable  Last Vitals:  Vitals:   07/20/16 0825 07/20/16 0830  BP: 130/71 129/69  Pulse:    Resp: 12 11  Temp:      Last Pain:  Vitals:   07/20/16 0741  TempSrc: Oral  PainSc: 0-No pain      Patients Stated Pain Goal: 8 (59/[id number])  Complications: No apparent anesthesia complications

## 2016-07-20 NOTE — Anesthesia Postprocedure Evaluation (Signed)
Anesthesia Post Note  Patient: Tracie Wiggins  Procedure(s) Performed: Procedure(s) (LRB): CATARACT EXTRACTION PHACO AND INTRAOCULAR LENS PLACEMENT (IOC) (Left)  Patient location during evaluation: Short Stay Anesthesia Type: MAC Level of consciousness: awake and alert Pain management: satisfactory to patient Vital Signs Assessment: post-procedure vital signs reviewed and stable Respiratory status: spontaneous breathing Cardiovascular status: stable Postop Assessment: no signs of nausea or vomiting Anesthetic complications: no     Last Vitals:  Vitals:   07/20/16 0825 07/20/16 0830  BP: 130/71 129/69  Pulse:    Resp: 12 11  Temp:      Last Pain:  Vitals:   07/20/16 0741  TempSrc: Oral  PainSc: 0-No pain                 Kendre Jacinto

## 2016-07-23 ENCOUNTER — Encounter (HOSPITAL_COMMUNITY): Payer: Self-pay | Admitting: Ophthalmology

## 2016-08-31 DIAGNOSIS — H25811 Combined forms of age-related cataract, right eye: Secondary | ICD-10-CM | POA: Diagnosis not present

## 2016-09-04 ENCOUNTER — Encounter (HOSPITAL_COMMUNITY)
Admission: RE | Admit: 2016-09-04 | Discharge: 2016-09-04 | Disposition: A | Discharge status: Home / Self Care | Payer: PPO | Source: Ambulatory Visit | Attending: Ophthalmology | Admitting: Ophthalmology

## 2016-09-06 MED ORDER — PHENYLEPHRINE HCL 2.5 % OP SOLN
OPHTHALMIC | Status: AC
  Filled 2016-09-06: qty 15

## 2016-09-06 MED ORDER — TETRACAINE HCL 0.5 % OP SOLN
OPHTHALMIC | Status: AC
  Filled 2016-09-06: qty 4

## 2016-09-06 MED ORDER — NEOMYCIN-POLYMYXIN-DEXAMETH 3.5-10000-0.1 OP SUSP
OPHTHALMIC | Status: AC
  Filled 2016-09-06: qty 5

## 2016-09-06 MED ORDER — LIDOCAINE HCL (PF) 1 % IJ SOLN
INTRAMUSCULAR | Status: AC
  Filled 2016-09-06: qty 2

## 2016-09-06 MED ORDER — CYCLOPENTOLATE-PHENYLEPHRINE 0.2-1 % OP SOLN
OPHTHALMIC | Status: AC
  Filled 2016-09-06: qty 2

## 2016-09-06 MED ORDER — LIDOCAINE HCL 3.5 % OP GEL
OPHTHALMIC | Status: AC
  Filled 2016-09-06: qty 1

## 2016-09-07 ENCOUNTER — Ambulatory Visit (HOSPITAL_COMMUNITY): Payer: PPO | Admitting: Anesthesiology

## 2016-09-07 ENCOUNTER — Encounter (HOSPITAL_COMMUNITY)
Admission: RE | Disposition: A | Discharge status: Home / Self Care | Payer: Self-pay | Source: Ambulatory Visit | Attending: Ophthalmology

## 2016-09-07 ENCOUNTER — Ambulatory Visit (HOSPITAL_COMMUNITY)
Admission: RE | Admit: 2016-09-07 | Discharge: 2016-09-07 | Disposition: A | Discharge status: Home / Self Care | Payer: PPO | Source: Ambulatory Visit | Attending: Ophthalmology | Admitting: Ophthalmology

## 2016-09-07 ENCOUNTER — Encounter (HOSPITAL_COMMUNITY): Payer: Self-pay | Admitting: *Deleted

## 2016-09-07 DIAGNOSIS — K219 Gastro-esophageal reflux disease without esophagitis: Secondary | ICD-10-CM | POA: Diagnosis not present

## 2016-09-07 DIAGNOSIS — I10 Essential (primary) hypertension: Secondary | ICD-10-CM | POA: Insufficient documentation

## 2016-09-07 DIAGNOSIS — M1990 Unspecified osteoarthritis, unspecified site: Secondary | ICD-10-CM | POA: Diagnosis not present

## 2016-09-07 DIAGNOSIS — H2511 Age-related nuclear cataract, right eye: Secondary | ICD-10-CM | POA: Diagnosis not present

## 2016-09-07 DIAGNOSIS — Z6841 Body Mass Index (BMI) 40.0 and over, adult: Secondary | ICD-10-CM | POA: Insufficient documentation

## 2016-09-07 DIAGNOSIS — H25811 Combined forms of age-related cataract, right eye: Secondary | ICD-10-CM | POA: Diagnosis not present

## 2016-09-07 DIAGNOSIS — H269 Unspecified cataract: Secondary | ICD-10-CM | POA: Diagnosis not present

## 2016-09-07 HISTORY — PX: CATARACT EXTRACTION W/PHACO: SHX586

## 2016-09-07 SURGERY — PHACOEMULSIFICATION, CATARACT, WITH IOL INSERTION
Anesthesia: Monitor Anesthesia Care | Site: Eye | Laterality: Right

## 2016-09-07 MED ORDER — FENTANYL CITRATE (PF) 100 MCG/2ML IJ SOLN
25.0000 ug | Freq: Once | INTRAMUSCULAR | Status: AC
  Administered 2016-09-07: 25 ug via INTRAVENOUS

## 2016-09-07 MED ORDER — MIDAZOLAM HCL 2 MG/2ML IJ SOLN
1.0000 mg | INTRAMUSCULAR | Status: AC
  Administered 2016-09-07: 2 mg via INTRAVENOUS

## 2016-09-07 MED ORDER — PROVISC 10 MG/ML IO SOLN
INTRAOCULAR | Status: DC | PRN
  Administered 2016-09-07: 0.85 mL via INTRAOCULAR

## 2016-09-07 MED ORDER — POVIDONE-IODINE 5 % OP SOLN
OPHTHALMIC | Status: DC | PRN
  Administered 2016-09-07: 1 via OPHTHALMIC

## 2016-09-07 MED ORDER — BSS IO SOLN
INTRAOCULAR | Status: DC | PRN
  Administered 2016-09-07: 15 mL

## 2016-09-07 MED ORDER — LACTATED RINGERS IV SOLN
INTRAVENOUS | Status: DC
  Administered 2016-09-07: 1000 mL via INTRAVENOUS

## 2016-09-07 MED ORDER — BSS IO SOLN
INTRAOCULAR | Status: DC | PRN
  Administered 2016-09-07: 500 mL

## 2016-09-07 MED ORDER — FENTANYL CITRATE (PF) 100 MCG/2ML IJ SOLN
INTRAMUSCULAR | Status: AC
  Filled 2016-09-07: qty 2

## 2016-09-07 MED ORDER — CYCLOPENTOLATE-PHENYLEPHRINE 0.2-1 % OP SOLN
1.0000 [drp] | OPHTHALMIC | Status: AC
  Administered 2016-09-07 (×3): 1 [drp] via OPHTHALMIC

## 2016-09-07 MED ORDER — SODIUM HYALURONATE 23 MG/ML IO SOLN
INTRAOCULAR | Status: DC | PRN
  Administered 2016-09-07: 0.6 mL via INTRAOCULAR

## 2016-09-07 MED ORDER — MIDAZOLAM HCL 2 MG/2ML IJ SOLN
INTRAMUSCULAR | Status: AC
  Filled 2016-09-07: qty 2

## 2016-09-07 MED ORDER — LIDOCAINE HCL (PF) 1 % IJ SOLN
INTRAOCULAR | Status: DC | PRN
  Administered 2016-09-07: .9 mL via OPHTHALMIC

## 2016-09-07 MED ORDER — NEOMYCIN-POLYMYXIN-DEXAMETH 3.5-10000-0.1 OP SUSP
OPHTHALMIC | Status: DC | PRN
  Administered 2016-09-07: 2 [drp] via OPHTHALMIC

## 2016-09-07 MED ORDER — PHENYLEPHRINE HCL 2.5 % OP SOLN
1.0000 [drp] | OPHTHALMIC | Status: AC
  Administered 2016-09-07 (×3): 1 [drp] via OPHTHALMIC

## 2016-09-07 MED ORDER — MIDAZOLAM HCL 5 MG/5ML IJ SOLN
INTRAMUSCULAR | Status: DC | PRN
  Administered 2016-09-07: 2 mg via INTRAVENOUS

## 2016-09-07 MED ORDER — TETRACAINE HCL 0.5 % OP SOLN
1.0000 [drp] | OPHTHALMIC | Status: AC
  Administered 2016-09-07 (×3): 1 [drp] via OPHTHALMIC

## 2016-09-07 MED ORDER — LIDOCAINE HCL 3.5 % OP GEL
1.0000 "application " | Freq: Once | OPHTHALMIC | Status: AC
  Administered 2016-09-07: 1 via OPHTHALMIC

## 2016-09-07 SURGICAL SUPPLY — 12 items
CLOTH BEACON ORANGE TIMEOUT ST (SAFETY) ×2 IMPLANT
EYE SHIELD UNIVERSAL CLEAR (GAUZE/BANDAGES/DRESSINGS) ×2 IMPLANT
GLOVE BIOGEL PI IND STRL 6.5 (GLOVE) ×2 IMPLANT
GLOVE BIOGEL PI INDICATOR 6.5 (GLOVE) ×2
LENS ALC ACRYL/TECN (Ophthalmic Related) ×2 IMPLANT
NEEDLE HYPO 18GX1.5 BLUNT FILL (NEEDLE) ×2 IMPLANT
PAD ARMBOARD 7.5X6 YLW CONV (MISCELLANEOUS) ×2 IMPLANT
SYR TB 1ML LL NO SAFETY (SYRINGE) ×2 IMPLANT
TAPE SURG TRANSPORE 1 IN (GAUZE/BANDAGES/DRESSINGS) ×1 IMPLANT
TAPE SURGICAL TRANSPORE 1 IN (GAUZE/BANDAGES/DRESSINGS) ×1
VISCOELASTIC ADDITIONAL (OPHTHALMIC RELATED) ×2 IMPLANT
WATER STERILE IRR 250ML POUR (IV SOLUTION) ×2 IMPLANT

## 2016-09-07 NOTE — Anesthesia Preprocedure Evaluation (Signed)
Anesthesia Evaluation  Patient identified by MRN, date of birth, ID band Patient awake    Reviewed: Allergy & Precautions, NPO status , Patient's Chart, lab work & pertinent test results  Airway Mallampati: II  TM Distance: >3 FB Neck ROM: Full    Dental  (+) Teeth Intact   Pulmonary neg pulmonary ROS,    breath sounds clear to auscultation       Cardiovascular hypertension, Pt. on medications  Rhythm:Regular Rate:Normal     Neuro/Psych  Headaches, PSYCHIATRIC DISORDERS Anxiety Depression    GI/Hepatic GERD  ,  Endo/Other  Morbid obesity  Renal/GU      Musculoskeletal  (+) Arthritis ,   Abdominal   Peds  Hematology   Anesthesia Other Findings   Reproductive/Obstetrics                             Anesthesia Physical Anesthesia Plan  ASA: II  Anesthesia Plan: MAC   Post-op Pain Management:    Induction: Intravenous  PONV Risk Score and Plan:   Airway Management Planned: Nasal Cannula  Additional Equipment:   Intra-op Plan:   Post-operative Plan:   Informed Consent: I have reviewed the patients History and Physical, chart, labs and discussed the procedure including the risks, benefits and alternatives for the proposed anesthesia with the patient or authorized representative who has indicated his/her understanding and acceptance.     Plan Discussed with:   Anesthesia Plan Comments:         Anesthesia Quick Evaluation  

## 2016-09-07 NOTE — Op Note (Signed)
Date of procedure: 09/07/16  Pre-operative diagnosis: Visually significant cataract, Right Eye  Post-operative diagnosis: Visually significant cataract, Right Eye  Procedure: Removal of cataract via phacoemulsification and insertion of intra-ocular lens AMO PCB00  +22.5D into the capsular bag of the Right Eye  Attending surgeon: Gerda Diss. Kentaro Alewine, MD, MA  Anesthesia: MAC, Topical Akten  Complications: None  Estimated Blood Loss: <72m (minimal)  Specimens: None  Implants: As above  Indications:  Visually significant cataract, Right Eye  Procedure:  The patient was seen and identified in the pre-operative area. The operative eye was identified and dilated.  The operative eye was marked.  Topical anesthesia was administered to the operative eye.     The patient was then to the operative suite and placed in the supine position.  A timeout was performed confirming the patient, procedure to be performed, and all other relevant information.   The patient's face was prepped and draped in the usual fashion for intra-ocular surgery.  A lid speculum was placed into the operative eye and the surgical microscope moved into place and focused.  A superotemporal paracentesis was created using a 20 gauge paracentesis blade.  Shugarcaine was injected into the anterior chamber.  Viscoelastic was injected into the anterior chamber.  A temporal clear-corneal main wound incision was created using a 2.478mmicrokeratome.  A continuous curvilinear capsulorrhexis was initiated using an irrigating cystitome and completed using capsulorrhexis forceps.  Hydrodissection and hydrodeliniation were performed.  Viscoelastic was injected into the anterior chamber.  A phacoemulsification handpiece and a chopper as a second instrument were used to remove the nucleus and epinucleus. The irrigation/aspiration handpiece was used to remove any remaining cortical material.   The capsular bag was reinflated with viscoelastic,  checked, and found to be intact.  The intraocular lens was inserted into the capsular bag and dialed into place using a Kuglen hook.  The irrigation/aspiration handpiece was used to remove any remaining viscoelastic.  The clear corneal wound and paracentesis wounds were then hydrated and checked with Weck-Cels to be watertight.  The lid-speculum and drape was removed, and the patient's face was cleaned with a wet and dry 4x4.  Maxitrol was instilled in the eye before a clear shield was taped over the eye. The patient was taken to the post-operative care unit in good condition, having tolerated the procedure well.  Post-Op Instructions: [facility] and will receive all other orders and instructions.

## 2016-09-07 NOTE — Transfer of Care (Signed)
Immediate Anesthesia Transfer of Care Note  Patient: Tracie Wiggins  Procedure(s) Performed: Procedure(s) with comments: CATARACT EXTRACTION PHACO AND INTRAOCULAR LENS PLACEMENT (IOC) (Right) - CDE: 5.04  Patient Location: PACU  Anesthesia Type:MAC  Level of Consciousness: awake  Airway & Oxygen Therapy: Patient Spontanous Breathing  Post-op Assessment: Report given to RN  Post vital signs: Reviewed and stable  Last Vitals:  Vitals:   09/07/16 0725 09/07/16 0730  BP: 126/67 135/76  Pulse:    Resp: 12 14  Temp:    SpO2: 99% 99%    Last Pain:  Vitals:   09/07/16 0646  TempSrc: Oral      Patients Stated Pain Goal: 8 (59/16/38 4665)  Complications: No apparent anesthesia complications

## 2016-09-07 NOTE — Discharge Instructions (Signed)
PATIENT INSTRUCTIONS POST-ANESTHESIA  IMMEDIATELY FOLLOWING SURGERY:  Do not drive or operate machinery for the first twenty four hours after surgery.  Do not make any important decisions for twenty four hours after surgery or while taking narcotic pain medications or sedatives.  If you develop intractable nausea and vomiting or a severe headache please notify your doctor immediately.  FOLLOW-UP:  Please make an appointment with your surgeon as instructed. You do not need to follow up with anesthesia unless specifically instructed to do so.  WOUND CARE INSTRUCTIONS (if applicable):  Keep a dry clean dressing on the anesthesia/puncture wound site if there is drainage.  Once the wound has quit draining you may leave it open to air.  Generally you should leave the bandage intact for twenty four hours unless there is drainage.  If the epidural site drains for more than 36-48 hours please call the anesthesia department.  QUESTIONS?:  Please feel free to call your physician or the hospital operator if you have any questions, and they will be happy to assist you.      Please discharge patient when stable, will follow up today with Dr. Marisa Hua at the [facility] office at 11:30AM.  Leave shield in place until visit.  All paperwork with discharge instructions will be given at the office.

## 2016-09-07 NOTE — H&P (Signed)
The H and P was reviewed and updated. The patient was examined.  No changes were found after exam.  The surgical eye was marked.  

## 2016-09-07 NOTE — Anesthesia Postprocedure Evaluation (Addendum)
Anesthesia Post Note  Patient: Tracie Wiggins  Procedure(s) Performed: Procedure(s) (LRB): CATARACT EXTRACTION PHACO AND INTRAOCULAR LENS PLACEMENT (IOC) (Right)  Patient location during evaluation: Short Stay Anesthesia Type: MAC Level of consciousness: oriented and awake and alert Pain management: pain level controlled Vital Signs Assessment: post-procedure vital signs reviewed and stable Respiratory status: spontaneous breathing Cardiovascular status: blood pressure returned to baseline Postop Assessment: no signs of nausea or vomiting Anesthetic complications: no     Last Vitals:  Vitals:   09/07/16 0725 09/07/16 0730  BP: 126/67 135/76  Pulse:    Resp: 12 14  Temp:    SpO2: 99% 99%    Last Pain:  Vitals:   09/07/16 0646  TempSrc: Oral                 Waleed Dettman

## 2016-09-07 NOTE — Addendum Note (Signed)
Addendum  created 09/07/16 0845 by Ollen Bowl, CRNA   Anesthesia Event edited, Anesthesia Staff edited

## 2016-09-10 ENCOUNTER — Encounter (HOSPITAL_COMMUNITY): Payer: Self-pay | Admitting: Ophthalmology

## 2016-09-27 ENCOUNTER — Ambulatory Visit (INDEPENDENT_AMBULATORY_CARE_PROVIDER_SITE_OTHER): Payer: PPO

## 2016-09-27 DIAGNOSIS — Z23 Encounter for immunization: Secondary | ICD-10-CM

## 2016-10-10 ENCOUNTER — Ambulatory Visit (INDEPENDENT_AMBULATORY_CARE_PROVIDER_SITE_OTHER): Payer: PPO | Admitting: Nurse Practitioner

## 2016-10-10 ENCOUNTER — Encounter: Payer: Self-pay | Admitting: Nurse Practitioner

## 2016-10-10 VITALS — BP 140/83 | HR 67 | Temp 98.0°F | Ht 62.0 in | Wt 222.0 lb

## 2016-10-10 DIAGNOSIS — N3281 Overactive bladder: Secondary | ICD-10-CM | POA: Diagnosis not present

## 2016-10-10 DIAGNOSIS — F339 Major depressive disorder, recurrent, unspecified: Secondary | ICD-10-CM

## 2016-10-10 DIAGNOSIS — I10 Essential (primary) hypertension: Secondary | ICD-10-CM

## 2016-10-10 DIAGNOSIS — K219 Gastro-esophageal reflux disease without esophagitis: Secondary | ICD-10-CM | POA: Diagnosis not present

## 2016-10-10 DIAGNOSIS — M810 Age-related osteoporosis without current pathological fracture: Secondary | ICD-10-CM | POA: Diagnosis not present

## 2016-10-10 DIAGNOSIS — E782 Mixed hyperlipidemia: Secondary | ICD-10-CM | POA: Diagnosis not present

## 2016-10-10 LAB — CMP14+EGFR
ALT: 14 IU/L (ref 0–32)
AST: 17 IU/L (ref 0–40)
Albumin/Globulin Ratio: 1.7 (ref 1.2–2.2)
Albumin: 4 g/dL (ref 3.5–4.8)
Alkaline Phosphatase: 100 IU/L (ref 39–117)
BUN/Creatinine Ratio: 9 — ABNORMAL LOW (ref 12–28)
BUN: 9 mg/dL (ref 8–27)
Bilirubin Total: 0.4 mg/dL (ref 0.0–1.2)
CO2: 27 mmol/L (ref 20–29)
Calcium: 9.4 mg/dL (ref 8.7–10.3)
Chloride: 102 mmol/L (ref 96–106)
Creatinine, Ser: 0.95 mg/dL (ref 0.57–1.00)
GFR calc Af Amer: 69 mL/min/{1.73_m2} (ref >59)
GFR calc non Af Amer: 60 mL/min/{1.73_m2} (ref >59)
Globulin, Total: 2.3 g/dL (ref 1.5–4.5)
Glucose: 88 mg/dL (ref 65–99)
Potassium: 4.3 mmol/L (ref 3.5–5.2)
Sodium: 144 mmol/L (ref 134–144)
Total Protein: 6.3 g/dL (ref 6.0–8.5)

## 2016-10-10 LAB — LIPID PANEL
Chol/HDL Ratio: 2.9 ratio (ref 0.0–4.4)
Cholesterol, Total: 174 mg/dL (ref 100–199)
HDL: 59 mg/dL (ref >39)
LDL Calculated: 77 mg/dL (ref 0–99)
Triglycerides: 190 mg/dL — ABNORMAL HIGH (ref 0–149)
VLDL Cholesterol Cal: 38 mg/dL (ref 5–40)

## 2016-10-10 NOTE — Progress Notes (Signed)
Subjective:    Patient ID: Tracie Wiggins, female    DOB: [birth year], 71 y.o.   MRN: [id number]  HPI  Tracie Wiggins is here today for follow up of chronic medical problem.  Outpatient Encounter Prescriptions as of 10/10/2016  Medication Sig  . atorvastatin (LIPITOR) 10 MG tablet Take 1 tablet (10 mg total) by mouth daily.  [name] darifenacin (ENABLEX) 7.5 MG 24 hr tablet Take 1 tablet (7.5 mg total) by mouth daily.  [name] docusate sodium (COLACE) 100 MG capsule Take 200 mg by mouth daily.  . Hyprom-Naphaz-Polysorb-Zn Sulf (CLEAR EYES COMPLETE OP) Apply 1 drop to eye 4 (four) times daily as needed (dry eyes).   . Incontinence Supply Disposable (DEPEND SILHOUETTE BRIEFS L/XL) MISC 1 each by Does not apply route 3 (three) times daily.  . Incontinence Supply Disposable (POISE HOURGLASS SHAPE) PADS 1 each by Does not apply route 4 (four) times daily - after meals and at bedtime.  [name] lisinopril (PRINIVIL,ZESTRIL) 40 MG tablet Take 1 tablet (40 mg total) by mouth daily.  [name] loratadine (CLARITIN) 10 MG tablet Take 10 mg by mouth daily as needed for allergies.  . Multiple Vitamin (MULTIVITAMIN WITH MINERALS) TABS tablet Take 1 tablet by mouth daily. Centrum Silver  . naproxen sodium (ANAPROX) 220 MG tablet Take 220 mg by mouth daily as needed (pain).  . Omega-3 Fatty Acids (FISH OIL) 1000 MG CAPS Take 2 capsules (2,000 mg total) by mouth daily. (Patient taking differently: Take 1 capsule by mouth daily. )  . omeprazole (PRILOSEC) 40 MG capsule TAKE (1) CAPSULE DAILY (Patient taking differently: Take 40 mg by mouth daily. )  . sertraline (ZOLOFT) 25 MG tablet Take 1 tablet (25 mg total) by mouth daily. (Patient taking differently: Take 12.5 mg by mouth daily as needed (for anxiety). Only takes 0.5 tablet)     1. Essential hypertension  No c/o chest pain, SOB or headache. Does not check blood pressure at home BP Readings from Last 3 Encounters:  10/10/16 140/83  09/07/16 (!) 152/76  07/20/16 (!)  137/58     2. Gastroesophageal reflux disease without esophagitis  On omeprazole daily- keeps symptoms under control  3. Age-related osteoporosis without current pathological fracture  Last dexa was 10/05/14- will schedule for repeat scan- no c/o back pain  4. OAB (overactive bladder)  Is on enablex- sees urology yearly  5. Morbid obesity ([place])  No recent weight change  6. Mixed hyperlipidemia  Does not watch diet  7. Recurrent major depressive disorder, remission status unspecified ([place]) Is on zoloft daly- doing well Depression screen [facility] 2/9 10/10/2016 07/05/2016 04/23/2016  Decreased Interest 0 0 0  Down, Depressed, Hopeless 0 0 1  PHQ - 2 Score 0 0 1        New complaints: None today  Social history:      Review of Systems  Constitutional: Negative for activity change and appetite change.  HENT: Negative.   Eyes: Negative for pain.  Respiratory: Negative for shortness of breath.   Cardiovascular: Negative for chest pain, palpitations and leg swelling.  Gastrointestinal: Negative for abdominal pain.  Endocrine: Negative for polydipsia.  Genitourinary: Negative.   Skin: Negative for rash.  Neurological: Negative for dizziness, weakness and headaches.  Hematological: Does not bruise/bleed easily.  Psychiatric/Behavioral: Negative.   All other systems reviewed and are negative.      Objective:   Physical Exam  Constitutional: She is oriented to person, place, and time. She appears well-developed and well-nourished.  HENT:  Nose: Nose normal.  Mouth/Throat: Oropharynx is clear and moist.  Eyes: EOM are normal.  Neck: Trachea normal, normal range of motion and full passive range of motion without pain. Neck supple. No JVD present. Carotid bruit is not present. No thyromegaly present.  Cardiovascular: Normal rate, regular rhythm, normal heart sounds and intact distal pulses.  Exam reveals no gallop and no friction rub.   No murmur heard. Pulmonary/Chest: Effort  normal and breath sounds normal.  Abdominal: Soft. Bowel sounds are normal. She exhibits no distension and no mass. There is no tenderness.  Musculoskeletal: Normal range of motion.  Lymphadenopathy:    She has no cervical adenopathy.  Neurological: She is alert and oriented to person, place, and time. She has normal reflexes.  Skin: Skin is warm and dry.  Psychiatric: She has a normal mood and affect. Her behavior is normal. Judgment and thought content normal.   BP 140/83   Pulse 67   Temp 98 F (36.7 C) (Oral)   Ht '5\' 2"'  (1.575 m)   Wt 222 lb (100.7 kg)   BMI 40.60 kg/m      Assessment & Plan:  1. Essential hypertension Low sodium diet - CMP14+EGFR  2. Gastroesophageal reflux disease without esophagitis Avoid spicy foods Do not eat 2 hours prior to bedtime  3. Age-related osteoporosis without current pathological fracture Weight bearing exercises encouraged would like to start on prolia shots if needs anything Fosamax constipates her - DG [facility] DEXA  4. OAB (overactive bladder) Keep follow up with urology  5. Morbid obesity ([place]) Discussed diet and exercise for person with BMI >25 Will recheck weight in 3-6 months  6. Mixed hyperlipidemia Low fat diet encouraged - Lipid panel  7. Recurrent major depressive disorder, remission status unspecified ([place]) Stress management   Labs pending Health maintenance reviewed Diet and exercise encouraged Continue all meds Follow up  In 6 months    [place], FNP

## 2016-10-10 NOTE — Patient Instructions (Signed)
Stress and Stress Management Stress is a normal reaction to life events. It is what you feel when life demands more than you are used to or more than you can handle. Some stress can be useful. For example, the stress reaction can help you catch the last bus of the day, study for a test, or meet a deadline at work. But stress that occurs too often or for too long can cause problems. It can affect your emotional health and interfere with relationships and normal daily activities. Too much stress can weaken your immune system and increase your risk for physical illness. If you already have a medical problem, stress can make it worse. What are the causes? All sorts of life events may cause stress. An event that causes stress for one person may not be stressful for another person. Major life events commonly cause stress. These may be positive or negative. Examples include losing your job, moving into a new home, getting married, having a baby, or losing a loved one. Less obvious life events may also cause stress, especially if they occur day after day or in combination. Examples include working long hours, driving in traffic, caring for children, being in debt, or being in a difficult relationship. What are the signs or symptoms? Stress may cause emotional symptoms including, the following:  Anxiety. This is feeling worried, afraid, on edge, overwhelmed, or out of control.  Anger. This is feeling irritated or impatient.  Depression. This is feeling sad, down, helpless, or guilty.  Difficulty focusing, remembering, or making decisions.  Stress may cause physical symptoms, including the following:  Aches and pains. These may affect your head, neck, back, stomach, or other areas of your body.  Tight muscles or clenched jaw.  Low energy or trouble sleeping.  Stress may cause unhealthy behaviors, including the following:  Eating to feel better (overeating) or skipping meals.  Sleeping too little,  too much, or both.  Working too much or putting off tasks (procrastination).  Smoking, drinking alcohol, or using drugs to feel better.  How is this diagnosed? Stress is diagnosed through an assessment by your health care provider. Your health care provider will ask questions about your symptoms and any stressful life events.Your health care provider will also ask about your medical history and may order blood tests or other tests. Certain medical conditions and medicine can cause physical symptoms similar to stress. Mental illness can cause emotional symptoms and unhealthy behaviors similar to stress. Your health care provider may refer you to a mental health professional for further evaluation. How is this treated? Stress management is the recommended treatment for stress.The goals of stress management are reducing stressful life events and coping with stress in healthy ways. Techniques for reducing stressful life events include the following:  Stress identification. Self-monitor for stress and identify what causes stress for you. These skills may help you to avoid some stressful events.  Time management. Set your priorities, keep a calendar of events, and learn to say "no." These tools can help you avoid making too many commitments.  Techniques for coping with stress include the following:  Rethinking the problem. Try to think realistically about stressful events rather than ignoring them or overreacting. Try to find the positives in a stressful situation rather than focusing on the negatives.  Exercise. Physical exercise can release both physical and emotional tension. The key is to find a form of exercise you enjoy and do it regularly.  Relaxation techniques. These relax the body and  mind. Examples include yoga, meditation, tai chi, biofeedback, deep breathing, progressive muscle relaxation, listening to music, being out in nature, journaling, and other hobbies. Again, the key is to find  one or more that you enjoy and can do regularly.  Healthy lifestyle. Eat a balanced diet, get plenty of sleep, and do not smoke. Avoid using alcohol or drugs to relax.  Strong support network. Spend time with family, friends, or other people you enjoy being around.Express your feelings and talk things over with someone you trust.  Counseling or talktherapy with a mental health professional may be helpful if you are having difficulty managing stress on your own. Medicine is typically not recommended for the treatment of stress.Talk to your health care provider if you think you need medicine for symptoms of stress. Follow these instructions at home:  Keep all follow-up visits as directed by your health care provider.  Take all medicines as directed by your health care provider. Contact a health care provider if:  Your symptoms get worse or you start having new symptoms.  You feel overwhelmed by your problems and can no longer manage them on your own. Get help right away if:  You feel like hurting yourself or someone else. This information is not intended to replace advice given to you by your health care provider. Make sure you discuss any questions you have with your health care provider. Document Released: 06/13/2000 Document Revised: 05/26/2015 Document Reviewed: 08/12/2012 Elsevier Interactive Patient Education  2017 [employer] Inc.  

## 2016-10-22 ENCOUNTER — Other Ambulatory Visit: Payer: Self-pay | Admitting: Nurse Practitioner

## 2016-10-22 DIAGNOSIS — E785 Hyperlipidemia, unspecified: Secondary | ICD-10-CM

## 2016-12-17 ENCOUNTER — Other Ambulatory Visit: Payer: PPO

## 2017-01-22 NOTE — Progress Notes (Signed)
Wants to do it in April when she comes for her AWV

## 2017-02-07 DIAGNOSIS — Z961 Presence of intraocular lens: Secondary | ICD-10-CM | POA: Diagnosis not present

## 2017-02-14 ENCOUNTER — Other Ambulatory Visit: Payer: Self-pay | Admitting: Nurse Practitioner

## 2017-02-14 DIAGNOSIS — N3281 Overactive bladder: Secondary | ICD-10-CM

## 2017-02-14 DIAGNOSIS — K219 Gastro-esophageal reflux disease without esophagitis: Secondary | ICD-10-CM

## 2017-02-18 ENCOUNTER — Other Ambulatory Visit: Payer: Self-pay | Admitting: Nurse Practitioner

## 2017-02-18 DIAGNOSIS — N3281 Overactive bladder: Secondary | ICD-10-CM

## 2017-04-11 ENCOUNTER — Encounter: Payer: Self-pay | Admitting: Nurse Practitioner

## 2017-04-11 ENCOUNTER — Ambulatory Visit (INDEPENDENT_AMBULATORY_CARE_PROVIDER_SITE_OTHER): Payer: Medicare HMO | Admitting: Nurse Practitioner

## 2017-04-11 VITALS — BP 151/90 | HR 83 | Temp 98.2°F | Ht 62.0 in | Wt 225.0 lb

## 2017-04-11 DIAGNOSIS — N3281 Overactive bladder: Secondary | ICD-10-CM

## 2017-04-11 DIAGNOSIS — F339 Major depressive disorder, recurrent, unspecified: Secondary | ICD-10-CM | POA: Diagnosis not present

## 2017-04-11 DIAGNOSIS — E785 Hyperlipidemia, unspecified: Secondary | ICD-10-CM

## 2017-04-11 DIAGNOSIS — E782 Mixed hyperlipidemia: Secondary | ICD-10-CM | POA: Diagnosis not present

## 2017-04-11 DIAGNOSIS — M810 Age-related osteoporosis without current pathological fracture: Secondary | ICD-10-CM | POA: Diagnosis not present

## 2017-04-11 DIAGNOSIS — K219 Gastro-esophageal reflux disease without esophagitis: Secondary | ICD-10-CM

## 2017-04-11 DIAGNOSIS — I10 Essential (primary) hypertension: Secondary | ICD-10-CM | POA: Diagnosis not present

## 2017-04-11 DIAGNOSIS — R69 Illness, unspecified: Secondary | ICD-10-CM | POA: Diagnosis not present

## 2017-04-11 LAB — CMP14+EGFR
ALT: 17 IU/L (ref 0–32)
AST: 17 IU/L (ref 0–40)
Albumin/Globulin Ratio: 1.7 (ref 1.2–2.2)
Albumin: 4.1 g/dL (ref 3.5–4.8)
Alkaline Phosphatase: 101 IU/L (ref 39–117)
BUN/Creatinine Ratio: 11 — ABNORMAL LOW (ref 12–28)
BUN: 11 mg/dL (ref 8–27)
Bilirubin Total: 0.4 mg/dL (ref 0.0–1.2)
CO2: 25 mmol/L (ref 20–29)
Calcium: 9.4 mg/dL (ref 8.7–10.3)
Chloride: 103 mmol/L (ref 96–106)
Creatinine, Ser: 1.01 mg/dL — ABNORMAL HIGH (ref 0.57–1.00)
GFR calc Af Amer: 64 mL/min/{1.73_m2} (ref >59)
GFR calc non Af Amer: 55 mL/min/{1.73_m2} — ABNORMAL LOW (ref >59)
Globulin, Total: 2.4 g/dL (ref 1.5–4.5)
Glucose: 91 mg/dL (ref 65–99)
Potassium: 4.4 mmol/L (ref 3.5–5.2)
Sodium: 141 mmol/L (ref 134–144)
Total Protein: 6.5 g/dL (ref 6.0–8.5)

## 2017-04-11 LAB — LIPID PANEL
Chol/HDL Ratio: 2.7 ratio (ref 0.0–4.4)
Cholesterol, Total: 183 mg/dL (ref 100–199)
HDL: 68 mg/dL (ref >39)
LDL Calculated: 87 mg/dL (ref 0–99)
Triglycerides: 141 mg/dL (ref 0–149)
VLDL Cholesterol Cal: 28 mg/dL (ref 5–40)

## 2017-04-11 MED ORDER — SERTRALINE HCL 25 MG PO TABS: 12.5000 mg | ORAL_TABLET | Freq: Every day | ORAL | 1 refills | Status: DC | PRN

## 2017-04-11 MED ORDER — ATORVASTATIN CALCIUM 10 MG PO TABS: 10.0000 mg | ORAL_TABLET | Freq: Every day | ORAL | 1 refills | Status: DC

## 2017-04-11 MED ORDER — MIRABEGRON ER 50 MG PO TB24: 50.0000 mg | ORAL_TABLET | Freq: Every day | ORAL | 5 refills | Status: DC

## 2017-04-11 MED ORDER — OMEPRAZOLE 40 MG PO CPDR: 40.0000 mg | DELAYED_RELEASE_CAPSULE | Freq: Every day | ORAL | 1 refills | Status: DC

## 2017-04-11 MED ORDER — LISINOPRIL 40 MG PO TABS: 40.0000 mg | ORAL_TABLET | Freq: Every day | ORAL | 1 refills | Status: DC

## 2017-04-11 NOTE — Patient Instructions (Signed)
Bone Health Bones protect organs, store calcium, and anchor muscles. Good health habits, such as eating nutritious foods and exercising regularly, are important for maintaining healthy bones. They can also help to prevent a condition that causes bones to lose density and become weak and brittle (osteoporosis). Why is bone mass important? Bone mass refers to the amount of bone tissue that you have. The higher your bone mass, the stronger your bones. An important step toward having healthy bones throughout life is to have strong and dense bones during childhood. A young adult who has a high bone mass is more likely to have a high bone mass later in life. Bone mass at its greatest it is called peak bone mass. A large decline in bone mass occurs in older adults. In women, it occurs about the time of menopause. During this time, it is important to practice good health habits, because if more bone is lost than what is replaced, the bones will become less healthy and more likely to break (fracture). If you find that you have a low bone mass, you may be able to prevent osteoporosis or further bone loss by changing your diet and lifestyle. How can I find out if my bone mass is low? Bone mass can be measured with an X-ray test that is called a bone mineral density (BMD) test. This test is recommended for all women who are age 65 or older. It may also be recommended for men who are age 70 or older, or for people who are more likely to develop osteoporosis due to:  Having bones that break easily.  Having a long-term disease that weakens bones, such as kidney disease or rheumatoid arthritis.  Having menopause earlier than normal.  Taking medicine that weakens bones, such as steroids, thyroid hormones, or hormone treatment for breast cancer or prostate cancer.  Smoking.  Drinking three or more alcoholic drinks each day.  What are the nutritional recommendations for healthy bones? To have healthy bones, you  need to get enough of the right minerals and vitamins. Most nutrition experts recommend getting these nutrients from the foods that you eat. Nutritional recommendations vary from person to person. Ask your health care provider what is healthy for you. Here are some general guidelines. Calcium Recommendations Calcium is the most important (essential) mineral for bone health. Most people can get enough calcium from their diet, but supplements may be recommended for people who are at risk for osteoporosis. Good sources of calcium include:  Dairy products, such as low-fat or nonfat milk, cheese, and yogurt.  Dark green leafy vegetables, such as bok choy and broccoli.  Calcium-fortified foods, such as orange juice, cereal, bread, soy beverages, and tofu products.  Nuts, such as almonds.  Follow these recommended amounts for daily calcium intake:  Children, age 1?3: 700 mg.  Children, age 4?8: 1,000 mg.  Children, age 9?13: 1,300 mg.  Teens, age 14?18: 1,300 mg.  Adults, age 19?50: 1,000 mg.  Adults, age 51?70: ? Men: 1,000 mg. ? Women: 1,200 mg.  Adults, age 71 or older: 1,200 mg.  Pregnant and breastfeeding females: ? Teens: 1,300 mg. ? Adults: 1,000 mg.  Vitamin D Recommendations Vitamin D is the most essential vitamin for bone health. It helps the body to absorb calcium. Sunlight stimulates the skin to make vitamin D, so be sure to get enough sunlight. If you live in a cold climate or you do not get outside often, your health care provider may recommend that you take vitamin   D supplements. Good sources of vitamin D in your diet include:  Egg yolks.  Saltwater fish.  Milk and cereal fortified with vitamin D.  Follow these recommended amounts for daily vitamin D intake:  Children and teens, age 1?18: 600 international units.  Adults, age 50 or younger: 400-800 international units.  Adults, age 51 or older: 800-1,000 international units.  Other Nutrients Other nutrients  for bone health include:  Phosphorus. This mineral is found in meat, poultry, dairy foods, nuts, and legumes. The recommended daily intake for adult men and adult women is 700 mg.  Magnesium. This mineral is found in seeds, nuts, dark green vegetables, and legumes. The recommended daily intake for adult men is 400?420 mg. For adult women, it is 310?320 mg.  Vitamin K. This vitamin is found in green leafy vegetables. The recommended daily intake is 120 mg for adult men and 90 mg for adult women.  What type of physical activity is best for building and maintaining healthy bones? Weight-bearing and strength-building activities are important for building and maintaining peak bone mass. Weight-bearing activities cause muscles and bones to work against gravity. Strength-building activities increases muscle strength that supports bones. Weight-bearing and muscle-building activities include:  Walking and hiking.  Jogging and running.  Dancing.  Gym exercises.  Lifting weights.  Tennis and racquetball.  Climbing stairs.  Aerobics.  Adults should get at least 30 minutes of moderate physical activity on most days. Children should get at least 60 minutes of moderate physical activity on most days. Ask your health care provide what type of exercise is best for you. Where can I find more information? For more information, check out the following websites:  National Osteoporosis Foundation: http://nof.org/learn/basics  National Institutes of Health: http://www.niams.nih.gov/Health_Info/Bone/Bone_Health/bone_health_for_life.asp  This information is not intended to replace advice given to you by your health care provider. Make sure you discuss any questions you have with your health care provider. Document Released: 03/10/2003 Document Revised: 07/08/2015 Document Reviewed: 12/23/2013 Elsevier Interactive Patient Education  2018 Elsevier Inc.  

## 2017-04-11 NOTE — Progress Notes (Signed)
Subjective:    Patient ID: Tracie Wiggins, female    DOB: Jun 10, 1945, 72 y.o.   MRN: [id number]  HPI  TIMIRA Wiggins is here today for follow up of chronic medical problem.  Outpatient Encounter Medications as of 04/11/2017  Medication Sig  . atorvastatin (LIPITOR) 10 MG tablet Take 1 tablet (10 mg total) by mouth daily.  [name] darifenacin (ENABLEX) 7.5 MG 24 hr tablet Take 1 tablet (7.5 mg total) by mouth daily.  . Incontinence Supply Disposable (DEPEND SILHOUETTE BRIEFS L/XL) MISC 1 each by Does not apply route 3 (three) times daily.  . Incontinence Supply Disposable (POISE HOURGLASS SHAPE) PADS 1 each by Does not apply route 4 (four) times daily - after meals and at bedtime.  [name] lisinopril (PRINIVIL,ZESTRIL) 40 MG tablet Take 1 tablet (40 mg total) by mouth daily.  . Omega-3 Fatty Acids (FISH OIL) 1000 MG CAPS Take 2 capsules (2,000 mg total) by mouth daily. (Patient taking differently: Take 1 capsule by mouth daily. )  . omeprazole (PRILOSEC) 40 MG capsule TAKE (1) CAPSULE DAILY     1. Essential hypertension  No c/o chest pain, sob or headache. Does ot check blood pressure at home. Has not taken meds this morning BP Readings from Last 3 Encounters:  04/11/17 (!) 151/90  10/10/16 140/83  09/07/16 (!) 152/76     2. Gastroesophageal reflux disease without esophagitis  Takes omeprazole daily which keeps symptoms under control  3. Age-related osteoporosis without current pathological fracture  Last dexascan was done 11/19/2012. I  Am unable to pull up results .   4. OAB (overactive bladder)  Some days are good and others are bad. She wears depends all the time  5. Morbid obesity ([place])  No recent weight changes  6. Mixed hyperlipidemia  Does not watch diet very cloely  7. Recurrent major depressive disorder, remission status unspecified (Tracie Wiggins)  she is on zoloft daily which is working well for her.    New complaints: None today  Social history: Lives by herself with her dog  and cat. She has friends that she goes to lunch with    Review of Systems  Constitutional: Negative for activity change and appetite change.  HENT: Negative.   Eyes: Negative for pain.  Respiratory: Negative for shortness of breath.   Cardiovascular: Negative for chest pain, palpitations and leg swelling.  Gastrointestinal: Negative for abdominal pain.  Endocrine: Negative for polydipsia.  Genitourinary: Negative.   Skin: Negative for rash.  Neurological: Negative for dizziness, weakness and headaches.  Hematological: Does not bruise/bleed easily.  Psychiatric/Behavioral: Negative.   All other systems reviewed and are negative.      Objective:   Physical Exam  Constitutional: She is oriented to person, place, and time. She appears well-developed and well-nourished.  HENT:  Nose: Nose normal.  Mouth/Throat: Oropharynx is clear and moist.  Eyes: EOM are normal.  Neck: Trachea normal, normal range of motion and full passive range of motion without pain. Neck supple. No JVD present. Carotid bruit is not present. No thyromegaly present.  Cardiovascular: Normal rate, regular rhythm, normal heart sounds and intact distal pulses. Exam reveals no gallop and no friction rub.  No murmur heard. Pulmonary/Chest: Effort normal and breath sounds normal.  Abdominal: Soft. Bowel sounds are normal. She exhibits no distension and no mass. There is no tenderness.  Musculoskeletal: Normal range of motion.  Lymphadenopathy:    She has no cervical adenopathy.  Neurological: She is alert and oriented to person, place,  and time. She has normal reflexes.  Skin: Skin is warm and dry.  Psychiatric: She has a normal mood and affect. Her behavior is normal. Judgment and thought content normal.   BP (!) 151/90   Pulse 83   Temp 98.2 F (36.8 C) (Oral)   Ht '5\' 2"'  (1.575 m)   Wt 225 lb (102.1 kg)   BMI 41.15 kg/m         Assessment & Plan:  1. Essential hypertension Low sodium diet -  lisinopril (PRINIVIL,ZESTRIL) 40 MG tablet; Take 1 tablet (40 mg total) by mouth daily.  Dispense: 90 tablet; Refill: 1 - CMP14+EGFR  2. Gastroesophageal reflux disease without esophagitis Avoid spicy foods Do not eat 2 hours prior to bedtime - omeprazole (PRILOSEC) 40 MG capsule; Take 1 capsule (40 mg total) by mouth daily.  Dispense: 90 capsule; Refill: 1  3. Age-related osteoporosis without current pathological fracture Weight bearing exercises  4. OAB (overactive bladder) Changed enablex to myrbetriq- atient will let me know if to expensive or does not work - mirabegron ER (MYRBETRIQ) 50 MG TB24 tablet; Take 1 tablet (50 mg total) by mouth daily.  Dispense: 30 tablet; Refill: 5  5. Morbid obesity (Tracie Wiggins) Discussed diet and exercise for person with BMI >25 Will recheck weight in 3-6 months  6. Mixed hyperlipidemia Low fat diet - Lipid panel - atorvastatin (LIPITOR) 10 MG tablet; Take 1 tablet (10 mg total) by mouth daily.  Dispense: 90 tablet; Refill: 1   7. Recurrent major depressive disorder, remission status unspecified (Tracie Wiggins) Stress management - sertraline (ZOLOFT) 25 MG tablet; Take 0.5 tablets (12.5 mg total) by mouth daily as needed (for anxiety). Only takes 0.5 tablet  Dispense: 90 tablet; Refill: 1    Labs pending Health maintenance reviewed Diet and exercise encouraged Continue all meds Follow up  In 6 months   [place], FNP

## 2017-04-12 DIAGNOSIS — J309 Allergic rhinitis, unspecified: Secondary | ICD-10-CM | POA: Diagnosis not present

## 2017-04-12 DIAGNOSIS — I10 Essential (primary) hypertension: Secondary | ICD-10-CM | POA: Diagnosis not present

## 2017-04-12 DIAGNOSIS — G8929 Other chronic pain: Secondary | ICD-10-CM | POA: Diagnosis not present

## 2017-04-12 DIAGNOSIS — G4700 Insomnia, unspecified: Secondary | ICD-10-CM | POA: Diagnosis not present

## 2017-04-12 DIAGNOSIS — R69 Illness, unspecified: Secondary | ICD-10-CM | POA: Diagnosis not present

## 2017-04-12 DIAGNOSIS — E785 Hyperlipidemia, unspecified: Secondary | ICD-10-CM | POA: Diagnosis not present

## 2017-04-12 DIAGNOSIS — F419 Anxiety disorder, unspecified: Secondary | ICD-10-CM | POA: Diagnosis not present

## 2017-04-12 DIAGNOSIS — Z6841 Body Mass Index (BMI) 40.0 and over, adult: Secondary | ICD-10-CM | POA: Diagnosis not present

## 2017-04-24 ENCOUNTER — Other Ambulatory Visit: Payer: Medicare HMO

## 2017-04-24 ENCOUNTER — Ambulatory Visit: Payer: Self-pay | Admitting: *Deleted

## 2017-04-25 DIAGNOSIS — H26492 Other secondary cataract, left eye: Secondary | ICD-10-CM | POA: Diagnosis not present

## 2017-05-06 ENCOUNTER — Ambulatory Visit (INDEPENDENT_AMBULATORY_CARE_PROVIDER_SITE_OTHER): Payer: Medicare HMO | Admitting: *Deleted

## 2017-05-06 ENCOUNTER — Ambulatory Visit (INDEPENDENT_AMBULATORY_CARE_PROVIDER_SITE_OTHER): Payer: Medicare HMO

## 2017-05-06 ENCOUNTER — Other Ambulatory Visit: Payer: Self-pay | Admitting: Nurse Practitioner

## 2017-05-06 ENCOUNTER — Encounter: Payer: Self-pay | Admitting: *Deleted

## 2017-05-06 VITALS — BP 134/88 | HR 88 | Ht 60.25 in | Wt 224.0 lb

## 2017-05-06 DIAGNOSIS — Z1231 Encounter for screening mammogram for malignant neoplasm of breast: Secondary | ICD-10-CM

## 2017-05-06 DIAGNOSIS — Z0000 Encounter for general adult medical examination without abnormal findings: Secondary | ICD-10-CM

## 2017-05-06 DIAGNOSIS — M810 Age-related osteoporosis without current pathological fracture: Secondary | ICD-10-CM | POA: Diagnosis not present

## 2017-05-06 NOTE — Patient Instructions (Signed)
  Tracie Wiggins , Thank you for taking time to come for your Medicare Wellness Visit. I appreciate your ongoing commitment to your health goals. Please review the following plan we discussed and let me know if I can assist you in the future.   These are the goals we discussed: Goals    . Exercise 150 min/wk Moderate Activity     Start with walking 10 minutes a day and work your way up to 30 minutes five days a week or do chair exercises daily (see handout)    . Prevent falls     Move carefully to avoid falls. Change positions slowly.        This is a list of the screening recommended for you and due dates:  Health Maintenance  Topic Date Due  . DEXA scan (bone density measurement)  12/15/2016  . Tetanus Vaccine  04/12/2018*  . Mammogram  06/04/2017  . Flu Shot  08/01/2017  . Colon Cancer Screening  01/10/2022  .  Hepatitis C: One time screening is recommended by Center for Disease Control  (CDC) for  adults born from [birth year] through 1966.   Completed  . Pneumonia vaccines  Completed  *Topic was postponed. The date shown is not the original due date.

## 2017-05-06 NOTE — Progress Notes (Signed)
Subjective:   Tracie Wiggins is a 72 y.o. female who presents for a Medicare Annual Wellness Visit. Tracie Wiggins lives at home alone. She has been married twice and both husband's have died. Most recent one passed away about 13 years ago. She has one daughter and no grandchildren and is retired from [employer] fund[employer]. She watches a lot of television for entertainment.   Review of Systems    Patient reports that her health is unchanged compared to last year.  Cardiac Risk Factors include: advanced age (>52men, >24 women);hypertension;sedentary lifestyle;obesity (BMI >30kg/m2);dyslipidemia  Musculoskeletal: osteoporosis-alendronate caused constipation and she stopped taking it in Jan 2019.   Other systems negative unless otherwise noted in other area.    Current Medications (verified) Outpatient Encounter Medications as of 05/06/2017  Medication Sig  . atorvastatin (LIPITOR) 10 MG tablet Take 1 tablet (10 mg total) by mouth daily.  . Incontinence Supply Disposable (DEPEND SILHOUETTE BRIEFS L/XL) MISC 1 each by Does not apply route 3 (three) times daily.  . Incontinence Supply Disposable (POISE HOURGLASS SHAPE) PADS 1 each by Does not apply route 4 (four) times daily - after meals and at bedtime.  [name] lisinopril (PRINIVIL,ZESTRIL) 40 MG tablet Take 1 tablet (40 mg total) by mouth daily.  . mirabegron ER (MYRBETRIQ) 50 MG TB24 tablet Take 1 tablet (50 mg total) by mouth daily.  . Omega-3 Fatty Acids (FISH OIL) 1000 MG CAPS Take 2 capsules (2,000 mg total) by mouth daily. (Patient taking differently: Take 1 capsule by mouth daily. )  . omeprazole (PRILOSEC) 40 MG capsule Take 1 capsule (40 mg total) by mouth daily.  . sertraline (ZOLOFT) 25 MG tablet Take 0.5 tablets (12.5 mg total) by mouth daily as needed (for anxiety). Only takes 0.5 tablet   No facility-administered encounter medications on file as of 05/06/2017.     Allergies (verified) Asa [aspirin]; Codeine; Erythromycin; Penicillins; and Niacin  and related   History: Past Medical History:  Diagnosis Date  . Allergy   . Anxiety   . Arthritis   . Cataract   . Depression   . GERD (gastroesophageal reflux disease)   . Headache   . Hyperlipidemia   . Hypertension   . Osteoporosis   . Shingles    twice   Past Surgical History:  Procedure Laterality Date  . ABDOMINAL HYSTERECTOMY    . APPENDECTOMY    . BIOPSY BREAST Right    Fluid filled cyst  . CATARACT EXTRACTION W/PHACO Left 07/20/2016   Procedure: CATARACT EXTRACTION PHACO AND INTRAOCULAR LENS PLACEMENT (IOC);  Surgeon: Baruch Goldmann, MD;  Location: [facility];  Service: Ophthalmology;  Laterality: Left;  CDE: 3.19  . CATARACT EXTRACTION W/PHACO Right 09/07/2016   Procedure: CATARACT EXTRACTION PHACO AND INTRAOCULAR LENS PLACEMENT (IOC);  Surgeon: Baruch Goldmann, MD;  Location: [facility];  Service: Ophthalmology;  Laterality: Right;  CDE: 5.04  . CHOLECYSTECTOMY    . NASAL SINUS SURGERY     Family History  Problem Relation Age of Onset  . COPD Mother   . Stroke Mother   . Heart disease Father   . Heart attack Father   . Heart disease Sister   . Diabetes Sister   . Heart attack Sister   . Stroke Sister   . Healthy Daughter    Social History   Socioeconomic History  . Marital status: Widowed    Spouse name: Not on file  . Number of children: 1  . Years of education: 46  . Highest education level:  9th grade  Occupational History  . Occupation: Retired    Fish farm manager: [employer]  Social Needs  . Financial resource strain: Not very hard  . Food insecurity:    Worry: Never true    Inability: Never true  . Transportation needs:    Medical: No    Non-medical: No  Tobacco Use  . Smoking status: Never Smoker  . Smokeless tobacco: Never Used  Substance and Sexual Activity  . Alcohol use: No  . Drug use: No  . Sexual activity: Not Currently  Lifestyle  . Physical activity:    Days per week: 0 days    Minutes per session: 0 min  . Stress: Only a little  Relationships   . Social connections:    Talks on phone: More than three times a week    Gets together: More than three times a week    Attends religious service: More than 4 times per year    Active member of club or organization: Yes    Attends meetings of clubs or organizations: More than 4 times per year    Relationship status: Widowed  Other Topics Concern  . Not on file  Social History Narrative  . Not on file    Tobacco Use No.  Clinical Intake:     Pain : 0-10 Pain Score: 3  Pain Type: Intractable pain Pain Location: (Legs and hips) Pain Orientation: Right, Left Pain Descriptors / Indicators: Aching Pain Onset: More than a month ago Pain Frequency: Constant Effect of Pain on Daily Activities: minimal     Nutritional Status: BMI > 30  Obese Diabetes: No  How often do you need to have someone help you when you read instructions, pamphlets, or other written materials from your doctor or pharmacy?: 1 - Never What is the last grade level you completed in school?: 9th     Information entered by :: Chong Sicilian, RN   Activities of Daily Living In your present state of health, do you have any difficulty performing the following activities: 05/06/2017 07/18/2016  Hearing? N N  Vision? N N  Comment Recent cataract surgery. Seeing well now. Has yearly eye exams with Dr Radford Pax -  Difficulty concentrating or making decisions? N N  Walking or climbing stairs? N Y  Comment - SOB  Dressing or bathing? N N  Doing errands, shopping? N N  Preparing Food and eating ? N -  Using the Toilet? N -  In the past six months, have you accidently leaked urine? Y -  Comment takes myrbetriq -  Do you have problems with loss of bowel control? N -  Managing your Medications? N -  Comment Uses pillbox -  Managing your Finances? N -  Housekeeping or managing your Housekeeping? N -  Some recent data might be hidden     Immunizations and Health Maintenance Immunization History  Administered  Date(s) Administered  . Influenza Split 10/13/2012  . Influenza, High Dose Seasonal PF 01/06/2016, 09/27/2016  . Influenza,inj,Quad PF,6+ Mos 10/01/2013, 10/04/2014  . Pneumococcal Conjugate-13 04/07/2014  . Pneumococcal Polysaccharide-23 11/15/2010  . Tdap 05/17/2017   There are no preventive care reminders to display for this patient.  Diet Eats to satisfy hunger. Normally 2 to 3 times a day  Exercise Current Exercise Habits: The patient does not participate in regular exercise at present, Exercise limited by: orthopedic condition(s)   Depression Screen PHQ 2/9 Scores 05/06/2017 04/11/2017 10/10/2016 07/05/2016 04/23/2016 01/06/2016 10/06/2015  PHQ - 2 Score 0 0 0  0 1 0 0     Fall Risk Fall Risk  05/06/2017 04/11/2017 10/10/2016 07/05/2016 04/23/2016  Falls in the past year? No No No No No  Number falls in past yr: - - - - -  Injury with Fall? - - - - -    Safety Is the patient's home free of loose throw rugs in walkways, pet beds, electrical cords, etc?   yes      Grab bars in the bathroom? yes      Walkin shower? no      Shower Seat? no      Handrails on the stairs?   yes      Adequate lighting?   yes  Patient Care Team: Chevis Pretty, FNP as PCP - General (Nurse Practitioner) Particia Nearing, OD as Consulting Physician (Optometry)   No hospitalizations or ER visits this past year. Has had cataract surgery Objective:    Today's Vitals   05/06/17 1009 05/06/17 1012  BP: 134/88   Pulse: 88   Weight: 224 lb (101.6 kg)   Height: 5' 0.25" (1.53 m)   PainSc:  3    Body mass index is 43.38 kg/m.  Advanced Directives 05/06/2017 09/07/2016 07/20/2016 07/18/2016 04/23/2016 04/20/2015 04/07/2014  Does Patient Have a Medical Advance Directive? No No No No No No No  Would patient like information on creating a medical advance directive? No - Patient declined No - Patient declined No - Patient declined No - Patient declined Yes (MAU/Ambulatory/Procedural Areas - Information given) No -  patient declined information No - patient declined information    Hearing/Vision  normal or No deficits noted during visit.  Cognitive Function: MMSE - Mini Mental State Exam 05/06/2017 04/23/2016 04/20/2015 04/07/2014 04/07/2014  Orientation to time 4 5 5 5 5   Orientation to Place 3 4 5 5 5   Registration 3 3 3 3 3   Attention/ Calculation 0 1 4 5 5   Attention/Calculation-comments not attempted - - - -  Recall 2 3 3 2 2   Language- name 2 objects 2 2 2 2 2   Language- repeat 1 1 1 1 1   Language- follow 3 step command 3 3 2 3 3   Language- read & follow direction 1 1 1 1  -  Write a sentence 0 1 0 1 -  Copy design 0 0 0 0 -  Total score 19 24 26 28  -       Normal Cognitive Function Screening: No: Some areas were not attempted so the score is difficult to compare to last year. Further follow up seems reasonable though.      Assessment:   This is a routine wellness examination for Tracie Wiggins.    Plan:    Goals    . Exercise 150 min/wk Moderate Activity     Start with walking 10 minutes a day and work your way up to 30 minutes five days a week or do chair exercises daily (see handout)    . Prevent falls     Move carefully to avoid falls. Change positions slowly.        Keep f/u with Chevis Pretty, FNP and any other specialty appointments you may have Continue current medications Move carefully to avoid falls. Use assistive devices like a can or walker if needed. Aim for at least 150 minutes of moderate activity a week. This can be done with chair exercises if necessary. Read or work on puzzles daily Stay connected with friends and family  Review and return a signed,  witnessed, and notarized copy of Advance Directives if given.  Health Maintenance: Tdap Vaccine recommended: yes Zostavax (Shingles vaccine) recommended:yes Prevnar or Pneumovax (pneumonia vaccines) recommended:no  Cancer Screenings: Lung: Low Dose CT Chest recommended if Age 20-80 years, 30 pack-year  currently smoking OR have quit w/in 15years. Patient does not qualify. Colon cancer screening recommended: no Mammogram recommended:yes Pap Smear recommended: not applicable  Additional Screenings Hepatitis C Screening recommended: no Dexa Scan recommended: yes Diabetic Eye Exam recommended: not applicable  Orders Placed This Encounter  Procedures  . DG [facility] DEXA    Order Specific Question:   Reason for Exam (SYMPTOM  OR DIAGNOSIS REQUIRED)    Answer:   osteoporosis    I have personally reviewed and noted the following in the patient's chart:   . Medical and social history . Use of alcohol, tobacco or illicit drugs  . Current medications and supplements . Functional ability and status . Nutritional status . Physical activity . Advanced directives . List of other physicians . Hospitalizations, surgeries, and ER visits in previous 12 months . Vitals . Screenings to include cognitive, depression, and falls . Referrals and appointments  In addition, I have reviewed and discussed with patient certain preventive protocols, quality metrics, and best practice recommendations. A written personalized care plan for preventive services as well as general preventive health recommendations were provided to patient.     Chong Sicilian, RN   05/30/2017

## 2017-05-09 DIAGNOSIS — M8589 Other specified disorders of bone density and structure, multiple sites: Secondary | ICD-10-CM | POA: Diagnosis not present

## 2017-05-17 ENCOUNTER — Encounter: Payer: Self-pay | Admitting: Pharmacist Clinician (PhC)/ Clinical Pharmacy Specialist

## 2017-05-17 ENCOUNTER — Ambulatory Visit (INDEPENDENT_AMBULATORY_CARE_PROVIDER_SITE_OTHER): Payer: Medicare HMO | Admitting: Pharmacist Clinician (PhC)/ Clinical Pharmacy Specialist

## 2017-05-17 DIAGNOSIS — M810 Age-related osteoporosis without current pathological fracture: Secondary | ICD-10-CM

## 2017-05-17 NOTE — Progress Notes (Signed)
Patient comes in today for osteoporosis counseling following a recent dexascan.  She has had 4 prior dexascans showing osteopenia with being close to osteoprosis cut off in her hip.    Previous intolerance to several bisphosphontaes, most recently in 2018 with alendronate.  Patient has GERD and is taking omeprazole for symptoms but does not get complete relief.  She suffered from GI adverse effects and constipation with bisphosphonates.  She did take Prolia in 2014-2016 and her dexascan in 2016 showed significant improvements, however her insurance at that time was not covering it or co-pay was too high.  Her diet is poor in calcium and she can not taking supplements with calcium because of constipation.  She has tried several types.  She will begin taking Vitamin D 2000IU daily.  Will have Kristen submit with [employer] for prior approval for Prolia.  T-score in hip was -2.7.  Fall prevention counseling done today.  Total time with patient. 30 minutes Memory Argue, PharmD, CPP, FNLA

## 2017-05-24 ENCOUNTER — Ambulatory Visit: Payer: Medicare HMO | Admitting: Nurse Practitioner

## 2017-05-30 ENCOUNTER — Telehealth: Payer: Self-pay | Admitting: Nurse Practitioner

## 2017-05-30 NOTE — Telephone Encounter (Signed)
Tracie Wiggins,  This pt calls today to ask about her Prolia? Per notes from [facility]  - you would check on coverage. Can you call the pt and give an update.

## 2017-05-31 NOTE — Telephone Encounter (Signed)
Summary of benefits received. No administration fee.Patient is responsible for 20% of Prolia cost which will be around $240. Prior authorization is needed. Will submit that. Has GI upset and has taken alendronate which caused worsening and constipation. Has taken Prolia in the past with good results.

## 2017-05-31 NOTE — Telephone Encounter (Signed)
Prior authorization information filled out and faxed to [facility].

## 2017-06-10 ENCOUNTER — Ambulatory Visit (HOSPITAL_COMMUNITY)
Admission: RE | Admit: 2017-06-10 | Discharge: 2017-06-10 | Disposition: A | Discharge status: Home / Self Care | Payer: Medicare HMO | Source: Ambulatory Visit | Attending: Nurse Practitioner | Admitting: Nurse Practitioner

## 2017-06-10 ENCOUNTER — Encounter (HOSPITAL_COMMUNITY): Payer: Self-pay

## 2017-06-10 DIAGNOSIS — Z1231 Encounter for screening mammogram for malignant neoplasm of breast: Secondary | ICD-10-CM

## 2017-08-05 ENCOUNTER — Encounter: Payer: Self-pay | Admitting: Nurse Practitioner

## 2017-08-05 ENCOUNTER — Ambulatory Visit (INDEPENDENT_AMBULATORY_CARE_PROVIDER_SITE_OTHER): Payer: Medicare HMO | Admitting: Nurse Practitioner

## 2017-08-05 VITALS — BP 130/74 | HR 87 | Temp 98.2°F | Ht 60.25 in | Wt 228.4 lb

## 2017-08-05 DIAGNOSIS — J0101 Acute recurrent maxillary sinusitis: Secondary | ICD-10-CM

## 2017-08-05 MED ORDER — AZITHROMYCIN 250 MG PO TABS: ORAL_TABLET | ORAL | 0 refills | Status: DC

## 2017-08-05 NOTE — Progress Notes (Signed)
   Subjective:    Patient ID: Tracie Wiggins, female    DOB: 1945/01/03, 72 y.o.   MRN: [id number]   Chief Complaint: Sinusitis (3-4 days, no known fever, OTC actifed sinus) and Cough (yellow phlegm)   HPI Patient comes in today c/o cough and congestion. She also has headache. Started about 3-4 days ago. She has tried allegy meds oTC no relief.   Review of Systems  Constitutional: Negative for chills and fever.  HENT: Positive for congestion, sinus pressure and sinus pain. Negative for sore throat and trouble swallowing.   Respiratory: Positive for cough (productive yellowish).   Cardiovascular: Negative.   Genitourinary: Negative.   Neurological: Negative.   Psychiatric/Behavioral: Negative.   All other systems reviewed and are negative.      Objective:   Physical Exam  Constitutional: She is oriented to person, place, and time. She appears well-developed and well-nourished.  HENT:  Right Ear: Tympanic membrane, external ear and ear canal normal.  Left Ear: Tympanic membrane, external ear and ear canal normal.  Nose: Mucosal edema and rhinorrhea present. Right sinus exhibits maxillary sinus tenderness. Right sinus exhibits no frontal sinus tenderness. Left sinus exhibits maxillary sinus tenderness. Left sinus exhibits no frontal sinus tenderness.  Mouth/Throat: Uvula is midline, oropharynx is clear and moist and mucous membranes are normal.  Eyes: Pupils are equal, round, and reactive to light.  Neck: Normal range of motion. Neck supple.  Cardiovascular: Normal rate and regular rhythm.  Pulmonary/Chest: Effort normal and breath sounds normal.  Lymphadenopathy:    She has no cervical adenopathy.  Neurological: She is alert and oriented to person, place, and time.  Skin: Skin is warm.  Psychiatric: She has a normal mood and affect. Her behavior is normal.  Nursing note and vitals reviewed.   BP 130/74   Pulse 87   Temp 98.2 F (36.8 C) (Oral)   Ht 5' 0.25" (1.53 m)    Wt 228 lb 6.4 oz (103.6 kg)   BMI 44.24 kg/m      Assessment & Plan:  Tracie Wiggins in today with chief complaint of Sinusitis (3-4 days, no known fever, OTC actifed sinus) and Cough (yellow phlegm)   1. Acute recurrent maxillary sinusitis 1. Take meds as prescribed 2. Use a cool mist humidifier especially during the winter months and when heat has been humid. 3. Use saline nose sprays frequently 4. Saline irrigations of the nose can be very helpful if done frequently.  * 4X daily for 1 week*  * Use of a nettie pot can be helpful with this. Follow directions with this* 5. Drink plenty of fluids 6. Keep thermostat turn down low 7.For any cough or congestion  Use plain Mucinex- regular strength or max strength is fine   * Children- consult with Pharmacist for dosing 8. For fever or aces or pains- take tylenol or ibuprofen appropriate for age and weight.  * for fevers greater than 101 orally you may alternate ibuprofen and tylenol every  3 hours.   Meds ordered this encounter  Medications  . azithromycin (ZITHROMAX Z-PAK) 250 MG tablet    Sig: As directed    Dispense:  6 tablet    Refill:  0    Order Specific Question:   Supervising Provider    Answer:   Eustaquio Maize [[id number]]    Mary-Margaret Hassell Done, FNP

## 2017-08-05 NOTE — Patient Instructions (Signed)

## 2017-08-12 ENCOUNTER — Other Ambulatory Visit: Payer: Self-pay | Admitting: Nurse Practitioner

## 2017-08-12 DIAGNOSIS — N3281 Overactive bladder: Secondary | ICD-10-CM

## 2017-08-16 ENCOUNTER — Telehealth: Payer: Self-pay | Admitting: *Deleted

## 2017-08-16 NOTE — Telephone Encounter (Signed)
Pt declined Prolia at this time 

## 2017-10-07 ENCOUNTER — Ambulatory Visit: Payer: Medicare HMO | Admitting: Nurse Practitioner

## 2017-10-08 ENCOUNTER — Ambulatory Visit (INDEPENDENT_AMBULATORY_CARE_PROVIDER_SITE_OTHER): Payer: Medicare HMO | Admitting: Nurse Practitioner

## 2017-10-08 ENCOUNTER — Encounter: Payer: Self-pay | Admitting: Nurse Practitioner

## 2017-10-08 VITALS — BP 142/88 | HR 69 | Temp 98.1°F | Ht 60.0 in | Wt 233.0 lb

## 2017-10-08 DIAGNOSIS — K219 Gastro-esophageal reflux disease without esophagitis: Secondary | ICD-10-CM | POA: Diagnosis not present

## 2017-10-08 DIAGNOSIS — I10 Essential (primary) hypertension: Secondary | ICD-10-CM | POA: Diagnosis not present

## 2017-10-08 DIAGNOSIS — M810 Age-related osteoporosis without current pathological fracture: Secondary | ICD-10-CM | POA: Diagnosis not present

## 2017-10-08 DIAGNOSIS — E782 Mixed hyperlipidemia: Secondary | ICD-10-CM

## 2017-10-08 DIAGNOSIS — F339 Major depressive disorder, recurrent, unspecified: Secondary | ICD-10-CM

## 2017-10-08 DIAGNOSIS — Z23 Encounter for immunization: Secondary | ICD-10-CM

## 2017-10-08 DIAGNOSIS — N3281 Overactive bladder: Secondary | ICD-10-CM | POA: Diagnosis not present

## 2017-10-08 DIAGNOSIS — R69 Illness, unspecified: Secondary | ICD-10-CM | POA: Diagnosis not present

## 2017-10-08 MED ORDER — ATORVASTATIN CALCIUM 10 MG PO TABS: 10.0000 mg | ORAL_TABLET | Freq: Every day | ORAL | 1 refills | Status: DC

## 2017-10-08 MED ORDER — MIRABEGRON ER 50 MG PO TB24: 50.0000 mg | ORAL_TABLET | Freq: Every day | ORAL | 5 refills | Status: DC

## 2017-10-08 MED ORDER — OMEPRAZOLE 40 MG PO CPDR: 40.0000 mg | DELAYED_RELEASE_CAPSULE | Freq: Every day | ORAL | 1 refills | Status: DC

## 2017-10-08 MED ORDER — LISINOPRIL 40 MG PO TABS: 40.0000 mg | ORAL_TABLET | Freq: Every day | ORAL | 1 refills | Status: DC

## 2017-10-08 MED ORDER — FISH OIL 1000 MG PO CAPS: 1.0000 | ORAL_CAPSULE | Freq: Every day | ORAL | 1 refills | Status: DC

## 2017-10-08 MED ORDER — RISEDRONATE SODIUM 35 MG PO TABS: 35.0000 mg | ORAL_TABLET | ORAL | 1 refills | Status: DC

## 2017-10-08 MED ORDER — DARIFENACIN HYDROBROMIDE ER 7.5 MG PO TB24: 7.5000 mg | ORAL_TABLET | Freq: Every day | ORAL | 2 refills | Status: DC

## 2017-10-08 MED ORDER — SERTRALINE HCL 25 MG PO TABS: 12.5000 mg | ORAL_TABLET | Freq: Every day | ORAL | 1 refills | Status: DC | PRN

## 2017-10-08 NOTE — Patient Instructions (Signed)
Bone Health Bones protect organs, store calcium, and anchor muscles. Good health habits, such as eating nutritious foods and exercising regularly, are important for maintaining healthy bones. They can also help to prevent a condition that causes bones to lose density and become weak and brittle (osteoporosis). Why is bone mass important? Bone mass refers to the amount of bone tissue that you have. The higher your bone mass, the stronger your bones. An important step toward having healthy bones throughout life is to have strong and dense bones during childhood. A young adult who has a high bone mass is more likely to have a high bone mass later in life. Bone mass at its greatest it is called peak bone mass. A large decline in bone mass occurs in older adults. In women, it occurs about the time of menopause. During this time, it is important to practice good health habits, because if more bone is lost than what is replaced, the bones will become less healthy and more likely to break (fracture). If you find that you have a low bone mass, you may be able to prevent osteoporosis or further bone loss by changing your diet and lifestyle. How can I find out if my bone mass is low? Bone mass can be measured with an X-ray test that is called a bone mineral density (BMD) test. This test is recommended for all women who are age 65 or older. It may also be recommended for men who are age 70 or older, or for people who are more likely to develop osteoporosis due to:  Having bones that break easily.  Having a long-term disease that weakens bones, such as kidney disease or rheumatoid arthritis.  Having menopause earlier than normal.  Taking medicine that weakens bones, such as steroids, thyroid hormones, or hormone treatment for breast cancer or prostate cancer.  Smoking.  Drinking three or more alcoholic drinks each day.  What are the nutritional recommendations for healthy bones? To have healthy bones, you  need to get enough of the right minerals and vitamins. Most nutrition experts recommend getting these nutrients from the foods that you eat. Nutritional recommendations vary from person to person. Ask your health care provider what is healthy for you. Here are some general guidelines. Calcium Recommendations Calcium is the most important (essential) mineral for bone health. Most people can get enough calcium from their diet, but supplements may be recommended for people who are at risk for osteoporosis. Good sources of calcium include:  Dairy products, such as low-fat or nonfat milk, cheese, and yogurt.  Dark green leafy vegetables, such as bok choy and broccoli.  Calcium-fortified foods, such as orange juice, cereal, bread, soy beverages, and tofu products.  Nuts, such as almonds.  Follow these recommended amounts for daily calcium intake:  Children, age 1?3: 700 mg.  Children, age 4?8: 1,000 mg.  Children, age 9?13: 1,300 mg.  Teens, age 14?18: 1,300 mg.  Adults, age 19?50: 1,000 mg.  Adults, age 51?70: ? Men: 1,000 mg. ? Women: 1,200 mg.  Adults, age 71 or older: 1,200 mg.  Pregnant and breastfeeding females: ? Teens: 1,300 mg. ? Adults: 1,000 mg.  Vitamin D Recommendations Vitamin D is the most essential vitamin for bone health. It helps the body to absorb calcium. Sunlight stimulates the skin to make vitamin D, so be sure to get enough sunlight. If you live in a cold climate or you do not get outside often, your health care provider may recommend that you take vitamin   D supplements. Good sources of vitamin D in your diet include:  Egg yolks.  Saltwater fish.  Milk and cereal fortified with vitamin D.  Follow these recommended amounts for daily vitamin D intake:  Children and teens, age 1?18: 600 international units.  Adults, age 50 or younger: 400-800 international units.  Adults, age 51 or older: 800-1,000 international units.  Other Nutrients Other nutrients  for bone health include:  Phosphorus. This mineral is found in meat, poultry, dairy foods, nuts, and legumes. The recommended daily intake for adult men and adult women is 700 mg.  Magnesium. This mineral is found in seeds, nuts, dark green vegetables, and legumes. The recommended daily intake for adult men is 400?420 mg. For adult women, it is 310?320 mg.  Vitamin K. This vitamin is found in green leafy vegetables. The recommended daily intake is 120 mg for adult men and 90 mg for adult women.  What type of physical activity is best for building and maintaining healthy bones? Weight-bearing and strength-building activities are important for building and maintaining peak bone mass. Weight-bearing activities cause muscles and bones to work against gravity. Strength-building activities increases muscle strength that supports bones. Weight-bearing and muscle-building activities include:  Walking and hiking.  Jogging and running.  Dancing.  Gym exercises.  Lifting weights.  Tennis and racquetball.  Climbing stairs.  Aerobics.  Adults should get at least 30 minutes of moderate physical activity on most days. Children should get at least 60 minutes of moderate physical activity on most days. Ask your health care provide what type of exercise is best for you. Where can I find more information? For more information, check out the following websites:  National Osteoporosis Foundation: http://nof.org/learn/basics  National Institutes of Health: http://www.niams.nih.gov/Health_Info/Bone/Bone_Health/bone_health_for_life.asp  This information is not intended to replace advice given to you by your health care provider. Make sure you discuss any questions you have with your health care provider. Document Released: 03/10/2003 Document Revised: 07/08/2015 Document Reviewed: 12/23/2013 Elsevier Interactive Patient Education  2018 Elsevier Inc.  

## 2017-10-08 NOTE — Progress Notes (Signed)
Subjective:    Patient ID: Tracie Wiggins, female    DOB: 01-Oct-1945, 72 y.o.   MRN: [id number]   Chief Complaint: Medical Management of Chronic Issues   HPI:  1. Essential hypertension  No c/o chest pain, sob or headache. Does not check blood pressure at home. BP Readings from Last 3 Encounters:  08/05/17 130/74  05/06/17 134/88  04/11/17 (!) 151/90     2. Gastroesophageal reflux disease without esophagitis  Takes omeprazole daily to keep symptoms under control.  3. Age-related osteoporosis without current pathological fracture  Last dexascan was 05/06/17 was 2.7%. She does not weight bearing exercises. Sh euse to be on fosamax which caused constipation. The prolia is to expensive and she refuses  4. OAB (overactive bladder)  Takes enablexand myrbetriq daily. Works well for symptoms  5. Mixed hyperlipidemia  Does not watch diet and does no exercise  6. Recurrent major depressive disorder, remission status unspecified ([place])  patient is on zoloft dialy. Says it is working well. Depression screen [facility] 2/9 10/08/2017 08/05/2017 05/06/2017  Decreased Interest 0 0 0  Down, Depressed, Hopeless 0 0 0  PHQ - 2 Score 0 0 0     7. Morbid obesity ([place])  No recent weight changes    Outpatient Encounter Medications as of 10/08/2017  Medication Sig  . atorvastatin (LIPITOR) 10 MG tablet Take 1 tablet (10 mg total) by mouth daily.  . Cholecalciferol (VITAMIN D) 2000 units CAPS Take 1 capsule by mouth daily.  [name] darifenacin (ENABLEX) 7.5 MG 24 hr tablet Take 1 tablet (7.5 mg total) by mouth daily.  . Incontinence Supply Disposable (DEPEND SILHOUETTE BRIEFS L/XL) MISC 1 each by Does not apply route 3 (three) times daily.  . Incontinence Supply Disposable (POISE HOURGLASS SHAPE) PADS 1 each by Does not apply route 4 (four) times daily - after meals and at bedtime.  [name] lisinopril (PRINIVIL,ZESTRIL) 40 MG tablet Take 1 tablet (40 mg total) by mouth daily.  . mirabegron ER (MYRBETRIQ) 50 MG TB24  tablet Take 1 tablet (50 mg total) by mouth daily.  . Omega-3 Fatty Acids (FISH OIL) 1000 MG CAPS Take 2 capsules (2,000 mg total) by mouth daily. (Patient taking differently: Take 1 capsule by mouth daily. )  . omeprazole (PRILOSEC) 40 MG capsule Take 1 capsule (40 mg total) by mouth daily.  . sertraline (ZOLOFT) 25 MG tablet Take 0.5 tablets (12.5 mg total) by mouth daily as needed (for anxiety). Only takes 0.5 tablet  . [DISCONTINUED] azithromycin (ZITHROMAX Z-PAK) 250 MG tablet As directed       New complaints: None today  Social history: Lives by herself. Family and church people check on her frequently.   Review of Systems  Constitutional: Negative for activity change and appetite change.  HENT: Negative.   Eyes: Negative for pain.  Respiratory: Negative for shortness of breath.   Cardiovascular: Negative for chest pain, palpitations and leg swelling.  Gastrointestinal: Negative for abdominal pain.  Endocrine: Negative for polydipsia.  Genitourinary: Negative.   Skin: Negative for rash.  Neurological: Negative for dizziness, weakness and headaches.  Hematological: Does not bruise/bleed easily.  Psychiatric/Behavioral: Negative.   All other systems reviewed and are negative.      Objective:   Physical Exam  Constitutional: She is oriented to person, place, and time. She appears well-developed and well-nourished. No distress.  HENT:  Head: Normocephalic.  Nose: Nose normal.  Mouth/Throat: Oropharynx is clear and moist.  Eyes: Pupils are equal, round, and reactive to  light. EOM are normal.  Neck: Normal range of motion. Neck supple. No JVD present. Carotid bruit is not present.  Cardiovascular: Normal rate, regular rhythm, normal heart sounds and intact distal pulses.  Pulmonary/Chest: Effort normal and breath sounds normal. No respiratory distress. She has no wheezes. She has no rales. She exhibits no tenderness.  Abdominal: Soft. Normal appearance, normal aorta and  bowel sounds are normal. She exhibits no distension, no abdominal bruit, no pulsatile midline mass and no mass. There is no splenomegaly or hepatomegaly. There is no tenderness.  Musculoskeletal: Normal range of motion. She exhibits no edema.  Lymphadenopathy:    She has no cervical adenopathy.  Neurological: She is alert and oriented to person, place, and time. She has normal reflexes.  Skin: Skin is warm and dry.  Psychiatric: She has a normal mood and affect. Her behavior is normal. Judgment and thought content normal.  Nursing note and vitals reviewed.  BP (!) 142/88   Pulse 69   Temp 98.1 F (36.7 C) (Oral)   Ht 5' (1.524 m)   Wt 233 lb (105.7 kg)   BMI 45.50 kg/m         Assessment & Plan:  Tracie Wiggins comes in today with chief complaint of Medical Management of Chronic Issues   Diagnosis and orders addressed:  1. Essential hypertension Low sodium diet - lisinopril (PRINIVIL,ZESTRIL) 40 MG tablet; Take 1 tablet (40 mg total) by mouth daily.  Dispense: 90 tablet; Refill: 1 - CMP14+EGFR  2. Gastroesophageal reflux disease without esophagitis Avoid spicy foods Do not eat 2 hours prior to bedtime - omeprazole (PRILOSEC) 40 MG capsule; Take 1 capsule (40 mg total) by mouth daily.  Dispense: 90 capsule; Refill: 1  3. Age-related osteoporosis without current pathological fracture Weight bearing exercises added actonel to meds - risedronate (ACTONEL) 35 MG tablet; Take 1 tablet (35 mg total) by mouth every 7 (seven) days. with water on empty stomach, nothing by mouth or lie down for next 30 minutes.  Dispense: 90 tablet; Refill: 1  4. OAB (overactive bladder) - darifenacin (ENABLEX) 7.5 MG 24 hr tablet; Take 1 tablet (7.5 mg total) by mouth daily.  Dispense: 30 tablet; Refill: 2 - mirabegron ER (MYRBETRIQ) 50 MG TB24 tablet; Take 1 tablet (50 mg total) by mouth daily.  Dispense: 30 tablet; Refill: 5  5. Mixed hyperlipidemia Low fat diet - Lipid panel -  atorvastatin (LIPITOR) 10 MG tablet; Take 1 tablet (10 mg total) by mouth daily.  Dispense: 90 tablet; Refill: 1 - Omega-3 Fatty Acids (FISH OIL) 1000 MG CAPS; Take 1 capsule (1,000 mg total) by mouth daily.  Dispense: 90 capsule; Refill: 1  6. Recurrent major depressive disorder, remission status unspecified ([facility]) Stress management - sertraline (ZOLOFT) 25 MG tablet; Take 0.5 tablets (12.5 mg total) by mouth daily as needed (for anxiety). Only takes 0.5 tablet  Dispense: 90 tablet; Refill: 1  7. Morbid obesity ([place]) Discussed diet and exercise for person with BMI >25 Will recheck weight in 3-6 months   Labs pending Health Maintenance reviewed Diet and exercise encouraged  Follow up plan: 6 months   Mary-Margaret Hassell Done, FNP

## 2017-10-09 LAB — CMP14+EGFR
ALT: 13 IU/L (ref 0–32)
AST: 17 IU/L (ref 0–40)
Albumin/Globulin Ratio: 1.8 (ref 1.2–2.2)
Albumin: 4.1 g/dL (ref 3.5–4.8)
Alkaline Phosphatase: 87 IU/L (ref 39–117)
BUN/Creatinine Ratio: 13 (ref 12–28)
BUN: 12 mg/dL (ref 8–27)
Bilirubin Total: 0.4 mg/dL (ref 0.0–1.2)
CO2: 21 mmol/L (ref 20–29)
Calcium: 9.2 mg/dL (ref 8.7–10.3)
Chloride: 103 mmol/L (ref 96–106)
Creatinine, Ser: 0.94 mg/dL (ref 0.57–1.00)
GFR calc Af Amer: 70 mL/min/{1.73_m2} (ref >59)
GFR calc non Af Amer: 60 mL/min/{1.73_m2} (ref >59)
Globulin, Total: 2.3 g/dL (ref 1.5–4.5)
Glucose: 100 mg/dL — ABNORMAL HIGH (ref 65–99)
Potassium: 4.1 mmol/L (ref 3.5–5.2)
Sodium: 144 mmol/L (ref 134–144)
Total Protein: 6.4 g/dL (ref 6.0–8.5)

## 2017-10-09 LAB — LIPID PANEL
Chol/HDL Ratio: 2.6 ratio (ref 0.0–4.4)
Cholesterol, Total: 182 mg/dL (ref 100–199)
HDL: 69 mg/dL (ref >39)
LDL Calculated: 86 mg/dL (ref 0–99)
Triglycerides: 137 mg/dL (ref 0–149)
VLDL Cholesterol Cal: 27 mg/dL (ref 5–40)

## 2018-03-06 ENCOUNTER — Other Ambulatory Visit: Payer: Self-pay | Admitting: Nurse Practitioner

## 2018-03-06 DIAGNOSIS — N3281 Overactive bladder: Secondary | ICD-10-CM

## 2018-03-28 ENCOUNTER — Other Ambulatory Visit: Payer: Self-pay | Admitting: Nurse Practitioner

## 2018-03-28 DIAGNOSIS — E782 Mixed hyperlipidemia: Secondary | ICD-10-CM

## 2018-03-28 DIAGNOSIS — K219 Gastro-esophageal reflux disease without esophagitis: Secondary | ICD-10-CM

## 2018-03-28 DIAGNOSIS — I10 Essential (primary) hypertension: Secondary | ICD-10-CM

## 2018-04-10 ENCOUNTER — Other Ambulatory Visit: Payer: Self-pay

## 2018-04-10 ENCOUNTER — Ambulatory Visit (INDEPENDENT_AMBULATORY_CARE_PROVIDER_SITE_OTHER): Payer: Medicare HMO | Admitting: Nurse Practitioner

## 2018-04-10 ENCOUNTER — Encounter: Payer: Self-pay | Admitting: Nurse Practitioner

## 2018-04-10 DIAGNOSIS — E782 Mixed hyperlipidemia: Secondary | ICD-10-CM | POA: Diagnosis not present

## 2018-04-10 DIAGNOSIS — K219 Gastro-esophageal reflux disease without esophagitis: Secondary | ICD-10-CM

## 2018-04-10 DIAGNOSIS — R69 Illness, unspecified: Secondary | ICD-10-CM | POA: Diagnosis not present

## 2018-04-10 DIAGNOSIS — N3281 Overactive bladder: Secondary | ICD-10-CM | POA: Diagnosis not present

## 2018-04-10 DIAGNOSIS — I10 Essential (primary) hypertension: Secondary | ICD-10-CM | POA: Diagnosis not present

## 2018-04-10 DIAGNOSIS — M810 Age-related osteoporosis without current pathological fracture: Secondary | ICD-10-CM

## 2018-04-10 DIAGNOSIS — F339 Major depressive disorder, recurrent, unspecified: Secondary | ICD-10-CM

## 2018-04-10 MED ORDER — ATORVASTATIN CALCIUM 10 MG PO TABS: 10.0000 mg | ORAL_TABLET | Freq: Every day | ORAL | 1 refills | Status: DC

## 2018-04-10 MED ORDER — OMEPRAZOLE 40 MG PO CPDR: 40.0000 mg | DELAYED_RELEASE_CAPSULE | Freq: Every day | ORAL | 1 refills | Status: DC

## 2018-04-10 MED ORDER — SERTRALINE HCL 25 MG PO TABS: 12.5000 mg | ORAL_TABLET | Freq: Every day | ORAL | 1 refills | Status: DC | PRN

## 2018-04-10 MED ORDER — LISINOPRIL 40 MG PO TABS: 40.0000 mg | ORAL_TABLET | Freq: Every day | ORAL | 1 refills | Status: DC

## 2018-04-10 MED ORDER — DARIFENACIN HYDROBROMIDE ER 7.5 MG PO TB24: 7.5000 mg | ORAL_TABLET | Freq: Every day | ORAL | 1 refills | Status: DC

## 2018-04-10 MED ORDER — MIRABEGRON ER 50 MG PO TB24: 50.0000 mg | ORAL_TABLET | Freq: Every day | ORAL | 1 refills | Status: DC

## 2018-04-10 NOTE — Progress Notes (Signed)
Patient ID: Tracie Wiggins, female   DOB: 31-May-1945, 73 y.o.   MRN: [id number]    Virtual Visit via telephone Note  I connected with [name] on 04/10/18 at 11:00AM by telephone and verified that I am speaking with the correct person using two identifiers. Tracie Wiggins is currently located at home and non one is currently with her during visit. The provider, Mary-Margaret Hassell Done, FNP is located in their office at time of visit.  I discussed the limitations, risks, security and privacy concerns of performing an evaluation and management service by telephone and the availability of in person appointments. I also discussed with the patient that there may be a patient responsible charge related to this service. The patient expressed understanding and agreed to proceed.   History and Present Illness:   Chief Complaint: Medical Management of Chronic Issues   HPI:  1. Essential hypertension  No c/o chest pain, sob or headache. Does not check blood pressure at home. BP Readings from Last 3 Encounters:  10/08/17 (!) 142/88  08/05/17 130/74  05/06/17 134/88     2. Mixed hyperlipidemia  Not watching diet and does no exercise  3. Gastroesophageal reflux disease without esophagitis  she is on omeprazole daily and it does well keeping symptoms under control.  4. OAB (overactive bladder)  Takes enablex and myrbetriq daily. Keeps her form having to go to the restroom so often.  5. Age-related osteoporosis without current pathological fracture  Last dexascan was done 05/09/17. t score was -2.7.  6. Recurrent major depressive disorder, remission status unspecified ([place])  on zoloft. Works well to keep herr from feeling down.  7. Morbid obesity ([place])  No recent weight changes.    Outpatient Encounter Medications as of 04/10/2018  Medication Sig  . atorvastatin (LIPITOR) 10 MG tablet Take 1 tablet (10 mg total) by mouth daily.  . Cholecalciferol (VITAMIN D) 2000 units CAPS Take 1  capsule by mouth daily.  [name] darifenacin (ENABLEX) 7.5 MG 24 hr tablet Take 1 tablet (7.5 mg total) by mouth daily.  . Incontinence Supply Disposable (DEPEND SILHOUETTE BRIEFS L/XL) MISC 1 each by Does not apply route 3 (three) times daily.  . Incontinence Supply Disposable (POISE HOURGLASS SHAPE) PADS 1 each by Does not apply route 4 (four) times daily - after meals and at bedtime.  [name] lisinopril (PRINIVIL,ZESTRIL) 40 MG tablet Take 1 tablet (40 mg total) by mouth daily.  . mirabegron ER (MYRBETRIQ) 50 MG TB24 tablet Take 1 tablet (50 mg total) by mouth daily.  . Omega-3 Fatty Acids (FISH OIL) 1000 MG CAPS Take 1 capsule (1,000 mg total) by mouth daily.  [name] omeprazole (PRILOSEC) 40 MG capsule Take 1 capsule (40 mg total) by mouth daily.  . risedronate (ACTONEL) 35 MG tablet Take 1 tablet (35 mg total) by mouth every 7 (seven) days. with water on empty stomach, nothing by mouth or lie down for next 30 minutes.  . sertraline (ZOLOFT) 25 MG tablet Take 0.5 tablets (12.5 mg total) by mouth daily as needed (for anxiety). Only takes 0.5 tablet       New complaints: None today  Social history: Lives alone. Has lots of peopl calling and checking on her daily.   Review of Systems  Constitutional: Negative for diaphoresis and weight loss.  Eyes: Negative for blurred vision, double vision and pain.  Respiratory: Negative for shortness of breath.   Cardiovascular: Negative for chest pain, palpitations, orthopnea and leg swelling.  Gastrointestinal: Negative for abdominal  pain.  Skin: Negative for rash.  Neurological: Negative for dizziness, sensory change, loss of consciousness, weakness and headaches.  Endo/Heme/Allergies: Negative for polydipsia. Does not bruise/bleed easily.  Psychiatric/Behavioral: Negative for memory loss. The patient does not have insomnia.   All other systems reviewed and are negative.      Observations/Objective: Alert and oriented- answers all questions appropriately   Assessment and Plan: Tracie Wiggins comes in today with chief complaint of Medical Management of Chronic Issues   Diagnosis and orders addressed:  1. Essential hypertension Low sodium diet - lisinopril (PRINIVIL,ZESTRIL) 40 MG tablet; Take 1 tablet (40 mg total) by mouth daily.  Dispense: 90 tablet; Refill: 1  2. Mixed hyperlipidemia Low fat diet - atorvastatin (LIPITOR) 10 MG tablet; Take 1 tablet (10 mg total) by mouth daily.  Dispense: 90 tablet; Refill: 1  3. Gastroesophageal reflux disease without esophagitis Avoid spicy foods Do not eat 2 hours prior to bedtime - omeprazole (PRILOSEC) 40 MG capsule; Take 1 capsule (40 mg total) by mouth daily.  Dispense: 90 capsule; Refill: 1  4. OAB (overactive bladder) - darifenacin (ENABLEX) 7.5 MG 24 hr tablet; Take 1 tablet (7.5 mg total) by mouth daily.  Dispense: 90 tablet; Refill: 1 - mirabegron ER (MYRBETRIQ) 50 MG TB24 tablet; Take 1 tablet (50 mg total) by mouth daily.  Dispense: 90 tablet; Refill: 1  5. Age-related osteoporosis without current pathological fracture Encourage weight bearing exercise  6. Recurrent major depressive disorder, remission status unspecified (HCC) Stress management - sertraline (ZOLOFT) 25 MG tablet; Take 0.5 tablets (12.5 mg total) by mouth daily as needed (for anxiety). Only takes 0.5 tablet  Dispense: 90 tablet; Refill: 1  7. Morbid obesity ([place]) Discussed diet and exercise for person with BMI >25 Will recheck weight in 3-6 months    Previous lab results reviewed Health Maintenance reviewed Diet and exercise encouraged  Follow up plan: 3 months     I discussed the assessment and treatment plan with the patient. The patient was provided an opportunity to ask questions and all were answered. The patient agreed with the plan and demonstrated an understanding of the instructions.   The patient was advised to call back or seek an in-person evaluation if the symptoms worsen or if the  condition fails to improve as anticipated.  The above assessment and management plan was discussed with the patient. The patient verbalized understanding of and has agreed to the management plan. Patient is aware to call the clinic if symptoms persist or worsen. Patient is aware when to return to the clinic for a follow-up visit. Patient educated on when it is appropriate to go to the emergency department.    I provided 12 minutes of non-face-to-face time during this encounter.    Mary-Margaret Hassell Done, FNP

## 2018-05-04 DIAGNOSIS — M6281 Muscle weakness (generalized): Secondary | ICD-10-CM | POA: Diagnosis not present

## 2018-05-04 DIAGNOSIS — Z6833 Body mass index (BMI) 33.0-33.9, adult: Secondary | ICD-10-CM | POA: Diagnosis not present

## 2018-05-04 DIAGNOSIS — S0990XA Unspecified injury of head, initial encounter: Secondary | ICD-10-CM | POA: Diagnosis not present

## 2018-05-04 DIAGNOSIS — R2689 Other abnormalities of gait and mobility: Secondary | ICD-10-CM | POA: Diagnosis not present

## 2018-05-04 DIAGNOSIS — S3991XA Unspecified injury of abdomen, initial encounter: Secondary | ICD-10-CM | POA: Diagnosis not present

## 2018-05-04 DIAGNOSIS — L244 Irritant contact dermatitis due to drugs in contact with skin: Secondary | ICD-10-CM | POA: Diagnosis not present

## 2018-05-04 DIAGNOSIS — Z9181 History of falling: Secondary | ICD-10-CM | POA: Diagnosis not present

## 2018-05-04 DIAGNOSIS — R1032 Left lower quadrant pain: Secondary | ICD-10-CM | POA: Diagnosis not present

## 2018-05-04 DIAGNOSIS — Z1612 Extended spectrum beta lactamase (ESBL) resistance: Secondary | ICD-10-CM | POA: Diagnosis not present

## 2018-05-04 DIAGNOSIS — R404 Transient alteration of awareness: Secondary | ICD-10-CM | POA: Diagnosis not present

## 2018-05-04 DIAGNOSIS — E7800 Pure hypercholesterolemia, unspecified: Secondary | ICD-10-CM | POA: Diagnosis not present

## 2018-05-04 DIAGNOSIS — N83292 Other ovarian cyst, left side: Secondary | ICD-10-CM | POA: Diagnosis not present

## 2018-05-04 DIAGNOSIS — B9629 Other Escherichia coli [E. coli] as the cause of diseases classified elsewhere: Secondary | ICD-10-CM | POA: Diagnosis not present

## 2018-05-04 DIAGNOSIS — I10 Essential (primary) hypertension: Secondary | ICD-10-CM | POA: Diagnosis not present

## 2018-05-04 DIAGNOSIS — R0689 Other abnormalities of breathing: Secondary | ICD-10-CM | POA: Diagnosis not present

## 2018-05-04 DIAGNOSIS — R41841 Cognitive communication deficit: Secondary | ICD-10-CM | POA: Diagnosis not present

## 2018-05-04 DIAGNOSIS — T4995XA Adverse effect of unspecified topical agent, initial encounter: Secondary | ICD-10-CM | POA: Diagnosis not present

## 2018-05-04 DIAGNOSIS — R69 Illness, unspecified: Secondary | ICD-10-CM | POA: Diagnosis not present

## 2018-05-04 DIAGNOSIS — S299XXA Unspecified injury of thorax, initial encounter: Secondary | ICD-10-CM | POA: Diagnosis not present

## 2018-05-04 DIAGNOSIS — D320 Benign neoplasm of cerebral meninges: Secondary | ICD-10-CM | POA: Diagnosis not present

## 2018-05-04 DIAGNOSIS — R4182 Altered mental status, unspecified: Secondary | ICD-10-CM | POA: Diagnosis not present

## 2018-05-04 DIAGNOSIS — N390 Urinary tract infection, site not specified: Secondary | ICD-10-CM | POA: Diagnosis not present

## 2018-05-04 DIAGNOSIS — W19XXXA Unspecified fall, initial encounter: Secondary | ICD-10-CM | POA: Diagnosis not present

## 2018-05-04 DIAGNOSIS — R0902 Hypoxemia: Secondary | ICD-10-CM | POA: Diagnosis not present

## 2018-05-04 DIAGNOSIS — B9620 Unspecified Escherichia coli [E. coli] as the cause of diseases classified elsewhere: Secondary | ICD-10-CM | POA: Diagnosis not present

## 2018-05-08 DIAGNOSIS — B9629 Other Escherichia coli [E. coli] as the cause of diseases classified elsewhere: Secondary | ICD-10-CM | POA: Diagnosis not present

## 2018-05-08 DIAGNOSIS — Z9181 History of falling: Secondary | ICD-10-CM | POA: Diagnosis not present

## 2018-05-08 DIAGNOSIS — Z1612 Extended spectrum beta lactamase (ESBL) resistance: Secondary | ICD-10-CM | POA: Diagnosis not present

## 2018-05-08 DIAGNOSIS — N390 Urinary tract infection, site not specified: Secondary | ICD-10-CM | POA: Diagnosis not present

## 2018-05-08 DIAGNOSIS — R2689 Other abnormalities of gait and mobility: Secondary | ICD-10-CM | POA: Diagnosis not present

## 2018-05-08 DIAGNOSIS — I10 Essential (primary) hypertension: Secondary | ICD-10-CM | POA: Diagnosis not present

## 2018-05-08 DIAGNOSIS — R41841 Cognitive communication deficit: Secondary | ICD-10-CM | POA: Diagnosis not present

## 2018-05-08 DIAGNOSIS — R4182 Altered mental status, unspecified: Secondary | ICD-10-CM | POA: Diagnosis not present

## 2018-05-08 DIAGNOSIS — R69 Illness, unspecified: Secondary | ICD-10-CM | POA: Diagnosis not present

## 2018-05-08 DIAGNOSIS — D329 Benign neoplasm of meninges, unspecified: Secondary | ICD-10-CM | POA: Diagnosis not present

## 2018-05-08 DIAGNOSIS — N39498 Other specified urinary incontinence: Secondary | ICD-10-CM | POA: Diagnosis not present

## 2018-05-08 DIAGNOSIS — M6281 Muscle weakness (generalized): Secondary | ICD-10-CM | POA: Diagnosis not present

## 2018-05-09 DIAGNOSIS — N390 Urinary tract infection, site not specified: Secondary | ICD-10-CM | POA: Diagnosis not present

## 2018-05-09 DIAGNOSIS — D329 Benign neoplasm of meninges, unspecified: Secondary | ICD-10-CM | POA: Diagnosis not present

## 2018-05-09 DIAGNOSIS — R69 Illness, unspecified: Secondary | ICD-10-CM | POA: Diagnosis not present

## 2018-05-09 DIAGNOSIS — N39498 Other specified urinary incontinence: Secondary | ICD-10-CM | POA: Diagnosis not present

## 2018-05-13 ENCOUNTER — Telehealth: Payer: Self-pay | Admitting: Nurse Practitioner

## 2018-05-13 NOTE — Telephone Encounter (Signed)
PT daugther has called and states that the pt fell last saturday evening, and she was found Sunday, pt went to  [facility], and is placed west side hall for therapy, pt daughter wants to talk to MMM states that no one down there is giving her any information other than she had UTI and was disoriented that's why she fell.

## 2018-05-24 DIAGNOSIS — R69 Illness, unspecified: Secondary | ICD-10-CM | POA: Diagnosis not present

## 2018-05-24 DIAGNOSIS — I10 Essential (primary) hypertension: Secondary | ICD-10-CM | POA: Diagnosis not present

## 2018-05-24 DIAGNOSIS — I2510 Atherosclerotic heart disease of native coronary artery without angina pectoris: Secondary | ICD-10-CM | POA: Diagnosis not present

## 2018-05-24 DIAGNOSIS — Z1612 Extended spectrum beta lactamase (ESBL) resistance: Secondary | ICD-10-CM | POA: Diagnosis not present

## 2018-05-24 DIAGNOSIS — B9620 Unspecified Escherichia coli [E. coli] as the cause of diseases classified elsewhere: Secondary | ICD-10-CM | POA: Diagnosis not present

## 2018-05-24 DIAGNOSIS — E119 Type 2 diabetes mellitus without complications: Secondary | ICD-10-CM | POA: Diagnosis not present

## 2018-05-24 DIAGNOSIS — D329 Benign neoplasm of meninges, unspecified: Secondary | ICD-10-CM | POA: Diagnosis not present

## 2018-05-24 DIAGNOSIS — N390 Urinary tract infection, site not specified: Secondary | ICD-10-CM | POA: Diagnosis not present

## 2018-05-27 DIAGNOSIS — E119 Type 2 diabetes mellitus without complications: Secondary | ICD-10-CM | POA: Diagnosis not present

## 2018-05-27 DIAGNOSIS — N390 Urinary tract infection, site not specified: Secondary | ICD-10-CM | POA: Diagnosis not present

## 2018-05-27 DIAGNOSIS — B9620 Unspecified Escherichia coli [E. coli] as the cause of diseases classified elsewhere: Secondary | ICD-10-CM | POA: Diagnosis not present

## 2018-05-27 DIAGNOSIS — Z1612 Extended spectrum beta lactamase (ESBL) resistance: Secondary | ICD-10-CM | POA: Diagnosis not present

## 2018-05-27 DIAGNOSIS — I10 Essential (primary) hypertension: Secondary | ICD-10-CM | POA: Diagnosis not present

## 2018-05-27 DIAGNOSIS — I2510 Atherosclerotic heart disease of native coronary artery without angina pectoris: Secondary | ICD-10-CM | POA: Diagnosis not present

## 2018-05-27 DIAGNOSIS — D329 Benign neoplasm of meninges, unspecified: Secondary | ICD-10-CM | POA: Diagnosis not present

## 2018-05-27 DIAGNOSIS — R69 Illness, unspecified: Secondary | ICD-10-CM | POA: Diagnosis not present

## 2018-05-29 ENCOUNTER — Ambulatory Visit: Payer: Medicare HMO | Admitting: *Deleted

## 2018-05-29 DIAGNOSIS — B9620 Unspecified Escherichia coli [E. coli] as the cause of diseases classified elsewhere: Secondary | ICD-10-CM | POA: Diagnosis not present

## 2018-05-29 DIAGNOSIS — N390 Urinary tract infection, site not specified: Secondary | ICD-10-CM | POA: Diagnosis not present

## 2018-05-29 DIAGNOSIS — Z1612 Extended spectrum beta lactamase (ESBL) resistance: Secondary | ICD-10-CM | POA: Diagnosis not present

## 2018-05-29 DIAGNOSIS — D329 Benign neoplasm of meninges, unspecified: Secondary | ICD-10-CM | POA: Diagnosis not present

## 2018-05-29 DIAGNOSIS — R69 Illness, unspecified: Secondary | ICD-10-CM | POA: Diagnosis not present

## 2018-05-29 DIAGNOSIS — E119 Type 2 diabetes mellitus without complications: Secondary | ICD-10-CM | POA: Diagnosis not present

## 2018-05-29 DIAGNOSIS — Z8673 Personal history of transient ischemic attack (TIA), and cerebral infarction without residual deficits: Secondary | ICD-10-CM

## 2018-05-29 DIAGNOSIS — I10 Essential (primary) hypertension: Secondary | ICD-10-CM | POA: Diagnosis not present

## 2018-05-29 DIAGNOSIS — I2510 Atherosclerotic heart disease of native coronary artery without angina pectoris: Secondary | ICD-10-CM | POA: Diagnosis not present

## 2018-05-30 DIAGNOSIS — R69 Illness, unspecified: Secondary | ICD-10-CM | POA: Diagnosis not present

## 2018-05-30 DIAGNOSIS — I2510 Atherosclerotic heart disease of native coronary artery without angina pectoris: Secondary | ICD-10-CM | POA: Diagnosis not present

## 2018-05-30 DIAGNOSIS — D329 Benign neoplasm of meninges, unspecified: Secondary | ICD-10-CM | POA: Diagnosis not present

## 2018-05-30 DIAGNOSIS — N390 Urinary tract infection, site not specified: Secondary | ICD-10-CM | POA: Diagnosis not present

## 2018-05-30 DIAGNOSIS — I10 Essential (primary) hypertension: Secondary | ICD-10-CM | POA: Diagnosis not present

## 2018-05-30 DIAGNOSIS — Z1612 Extended spectrum beta lactamase (ESBL) resistance: Secondary | ICD-10-CM | POA: Diagnosis not present

## 2018-05-30 DIAGNOSIS — E119 Type 2 diabetes mellitus without complications: Secondary | ICD-10-CM | POA: Diagnosis not present

## 2018-05-30 DIAGNOSIS — B9620 Unspecified Escherichia coli [E. coli] as the cause of diseases classified elsewhere: Secondary | ICD-10-CM | POA: Diagnosis not present

## 2018-05-30 NOTE — Chronic Care Management (AMB) (Addendum)
  Care Management   Note  05/30/2018 Name: Tracie Wiggins MRN: [id number] DOB: 02-14-1945  Tracie Wiggins is a 73 year old primary care patient of Shelah Lewand[facility]sky [facility] who was discharged from [facility] on 05/23/18. She was admitted from [facility] s/p hospitalization for TIA.  She had 2 red flags on EMMI f/u questionnaire that indicated she had difficulty with transportation to her doctor's appointment and that she had not picked up her medication at the time of the questioning.   I spoke with Tracie Wolfgang by telephone today and she indicated that both issues have been resolved. She is scheduled to see Shelah Lewandowsky on 06/02/18 at 9:00 am.   Follow up plan: Recommend provider consider CCM referral if she thinks it may be beneficial.   Chong Sicilian, RN-BC, BSN Nurse Care Manager [place] [phone]   "I have reviewed this encounter including the documentation in this note and/or discussed this patient with the nurse coordinator, Chong Sicilian, RN . I am certifying that I agree with the content of this note as supervising physician." [place], FNP

## 2018-06-02 ENCOUNTER — Encounter: Payer: Self-pay | Admitting: Nurse Practitioner

## 2018-06-02 ENCOUNTER — Ambulatory Visit (INDEPENDENT_AMBULATORY_CARE_PROVIDER_SITE_OTHER): Payer: Medicare HMO | Admitting: Nurse Practitioner

## 2018-06-02 ENCOUNTER — Other Ambulatory Visit: Payer: Self-pay

## 2018-06-02 VITALS — BP 138/95 | HR 95 | Temp 97.5°F | Ht 60.0 in | Wt 221.0 lb

## 2018-06-02 DIAGNOSIS — Z8673 Personal history of transient ischemic attack (TIA), and cerebral infarction without residual deficits: Secondary | ICD-10-CM

## 2018-06-02 DIAGNOSIS — R69 Illness, unspecified: Secondary | ICD-10-CM | POA: Diagnosis not present

## 2018-06-02 DIAGNOSIS — N390 Urinary tract infection, site not specified: Secondary | ICD-10-CM | POA: Diagnosis not present

## 2018-06-02 DIAGNOSIS — Z09 Encounter for follow-up examination after completed treatment for conditions other than malignant neoplasm: Secondary | ICD-10-CM | POA: Diagnosis not present

## 2018-06-02 DIAGNOSIS — B9620 Unspecified Escherichia coli [E. coli] as the cause of diseases classified elsewhere: Secondary | ICD-10-CM | POA: Diagnosis not present

## 2018-06-02 DIAGNOSIS — D329 Benign neoplasm of meninges, unspecified: Secondary | ICD-10-CM | POA: Diagnosis not present

## 2018-06-02 DIAGNOSIS — R3915 Urgency of urination: Secondary | ICD-10-CM | POA: Diagnosis not present

## 2018-06-02 DIAGNOSIS — E119 Type 2 diabetes mellitus without complications: Secondary | ICD-10-CM | POA: Diagnosis not present

## 2018-06-02 DIAGNOSIS — I2510 Atherosclerotic heart disease of native coronary artery without angina pectoris: Secondary | ICD-10-CM | POA: Diagnosis not present

## 2018-06-02 DIAGNOSIS — Z1612 Extended spectrum beta lactamase (ESBL) resistance: Secondary | ICD-10-CM | POA: Diagnosis not present

## 2018-06-02 DIAGNOSIS — I10 Essential (primary) hypertension: Secondary | ICD-10-CM | POA: Diagnosis not present

## 2018-06-02 NOTE — Patient Instructions (Signed)
Fall Prevention in the Home, Adult  Falls can cause injuries. They can happen to people of all ages. There are many things you can do to make your home safe and to help prevent falls. Ask for help when making these changes, if needed.  What actions can I take to prevent falls?  General Instructions  · Use good lighting in all rooms. Replace any light bulbs that burn out.  · Turn on the lights when you go into a dark area. Use night-lights.  · Keep items that you use often in easy-to-reach places. Lower the shelves around your home if necessary.  · Set up your furniture so you have a clear path. Avoid moving your furniture around.  · Do not have throw rugs and other things on the floor that can make you trip.  · Avoid walking on wet floors.  · If any of your floors are uneven, fix them.  · Add color or contrast paint or tape to clearly mark and help you see:  ? Any grab bars or handrails.  ? First and last steps of stairways.  ? Where the edge of each step is.  · If you use a stepladder:  ? Make sure that it is fully opened. Do not climb a closed stepladder.  ? Make sure that both sides of the stepladder are locked into place.  ? Ask someone to hold the stepladder for you while you use it.  · If there are any pets around you, be aware of where they are.  What can I do in the bathroom?         · Keep the floor dry. Clean up any water that spills onto the floor as soon as it happens.  · Remove soap buildup in the tub or shower regularly.  · Use non-skid mats or decals on the floor of the tub or shower.  · Attach bath mats securely with double-sided, non-slip rug tape.  · If you need to sit down in the shower, use a plastic, non-slip stool.  · Install grab bars by the toilet and in the tub and shower. Do not use towel bars as grab bars.  What can I do in the bedroom?  · Make sure that you have a light by your bed that is easy to reach.  · Do not use any sheets or blankets that are too big for your bed. They should  not hang down onto the floor.  · Have a firm chair that has side arms. You can use this for support while you get dressed.  What can I do in the kitchen?  · Clean up any spills right away.  · If you need to reach something above you, use a strong step stool that has a grab bar.  · Keep electrical cords out of the way.  · Do not use floor polish or wax that makes floors slippery. If you must use wax, use non-skid floor wax.  What can I do with my stairs?  · Do not leave any items on the stairs.  · Make sure that you have a light switch at the top of the stairs and the bottom of the stairs. If you do not have them, ask someone to add them for you.  · Make sure that there are handrails on both sides of the stairs, and use them. Fix handrails that are broken or loose. Make sure that handrails are as long as the stairways.  ·   Install non-slip stair treads on all stairs in your home.  · Avoid having throw rugs at the top or bottom of the stairs. If you do have throw rugs, attach them to the floor with carpet tape.  · Choose a carpet that does not hide the edge of the steps on the stairway.  · Check any carpeting to make sure that it is firmly attached to the stairs. Fix any carpet that is loose or worn.  What can I do on the outside of my home?  · Use bright outdoor lighting.  · Regularly fix the edges of walkways and driveways and fix any cracks.  · Remove anything that might make you trip as you walk through a door, such as a raised step or threshold.  · Trim any bushes or trees on the path to your home.  · Regularly check to see if handrails are loose or broken. Make sure that both sides of any steps have handrails.  · Install guardrails along the edges of any raised decks and porches.  · Clear walking paths of anything that might make someone trip, such as tools or rocks.  · Have any leaves, snow, or ice cleared regularly.  · Use sand or salt on walking paths during winter.  · Clean up any spills in your garage right  away. This includes grease or oil spills.  What other actions can I take?  · Wear shoes that:  ? Have a low heel. Do not wear high heels.  ? Have rubber bottoms.  ? Are comfortable and fit you well.  ? Are closed at the toe. Do not wear open-toe sandals.  · Use tools that help you move around (mobility aids) if they are needed. These include:  ? Canes.  ? Walkers.  ? Scooters.  ? Crutches.  · Review your medicines with your doctor. Some medicines can make you feel dizzy. This can increase your chance of falling.  Ask your doctor what other things you can do to help prevent falls.  Where to find more information  · Centers for Disease Control and Prevention, [employer]: https://cdc.gov  · National Institute on Aging: https://go4life.nia.nih.gov  Contact a doctor if:  · You are afraid of falling at home.  · You feel weak, drowsy, or dizzy at home.  · You fall at home.  Summary  · There are many simple things that you can do to make your home safe and to help prevent falls.  · Ways to make your home safe include removing tripping hazards and installing grab bars in the bathroom.  · Ask for help when making these changes in your home.  This information is not intended to replace advice given to you by your health care provider. Make sure you discuss any questions you have with your health care provider.  Document Released: 10/14/2008 Document Revised: 08/02/2016 Document Reviewed: 08/02/2016  Elsevier Interactive Patient Education © 2019 Elsevier Inc.

## 2018-06-02 NOTE — Progress Notes (Signed)
Subjective:    Patient ID: Tracie Wiggins, female    DOB: [birth year], 73 y.o.   MRN: [id number]   Chief Complaint: Hospitalization Follow-up   HPI Patient comesin today having been in hospital around May 13 having had a TIA, which caused a fall, and she also had a UTI. She went to [facility]  ([place]). She said she was on her patio and felt dizzy and just fell. She was discharged from hospital on May 23. Was discharged home on meds for urinary urgency ( enablex and ditropan) as well as mobic for knee pain. She says she is doing well. She says only problem she is having is legs feel weak.  Other wise she is feeling fine. She has not started her discharge medication, because she wanted to see me before she started taking it.   Review of Systems  Constitutional: Negative.   Respiratory: Negative.   Cardiovascular: Positive for leg swelling (slight- but is normal for her).  Gastrointestinal: Negative.   Genitourinary: Positive for frequency and urgency.  Neurological: Negative.   Psychiatric/Behavioral: Negative.   All other systems reviewed and are negative.      Objective:   Physical Exam Vitals signs and nursing note reviewed.  Constitutional:      General: She is not in acute distress.    Appearance: Normal appearance. She is well-developed.  HENT:     Head: Normocephalic.     Nose: Nose normal.  Eyes:     Pupils: Pupils are equal, round, and reactive to light.  Neck:     Musculoskeletal: Normal range of motion and neck supple.     Vascular: No carotid bruit or JVD.  Cardiovascular:     Rate and Rhythm: Normal rate and regular rhythm.     Heart sounds: Normal heart sounds.  Pulmonary:     Effort: Pulmonary effort is normal. No respiratory distress.     Breath sounds: Normal breath sounds. No wheezing or rales.  Chest:     Chest wall: No tenderness.  Abdominal:     General: Bowel sounds are normal. There is no distension or abdominal bruit.     Palpations:  Abdomen is soft. There is no hepatomegaly, splenomegaly, mass or pulsatile mass.     Tenderness: There is no abdominal tenderness.  Musculoskeletal: Normal range of motion.  Lymphadenopathy:     Cervical: No cervical adenopathy.  Skin:    General: Skin is warm and dry.  Neurological:     General: No focal deficit present.     Mental Status: She is alert and oriented to person, place, and time.     Cranial Nerves: No cranial nerve deficit.     Sensory: No sensory deficit.     Deep Tendon Reflexes: Reflexes are normal and symmetric.  Psychiatric:        Behavior: Behavior normal.        Thought Content: Thought content normal.        Judgment: Judgment normal.    BP (!) 138/95   Pulse 95   Temp (!) 97.5 F (36.4 C) (Oral)   Ht 5' (1.524 m)   Wt 221 lb (100.2 kg)   BMI 43.16 kg/m         Assessment & Plan:  Tracie Wiggins in today with chief complaint of Hospitalization Follow-up   1. Hx of transient ischemic attack (TIA) No residual effects Discussed signs and symptoms  2. Urinary urgency Start enablex and diflucan as rx  3. Hospital follow up Reviewed discharge papers brought in by patient  Follow up of routine check July 10, 2018  Chevis Pretty, FNP

## 2018-06-03 DIAGNOSIS — E119 Type 2 diabetes mellitus without complications: Secondary | ICD-10-CM | POA: Diagnosis not present

## 2018-06-03 DIAGNOSIS — Z1612 Extended spectrum beta lactamase (ESBL) resistance: Secondary | ICD-10-CM | POA: Diagnosis not present

## 2018-06-03 DIAGNOSIS — B9620 Unspecified Escherichia coli [E. coli] as the cause of diseases classified elsewhere: Secondary | ICD-10-CM | POA: Diagnosis not present

## 2018-06-03 DIAGNOSIS — I2510 Atherosclerotic heart disease of native coronary artery without angina pectoris: Secondary | ICD-10-CM | POA: Diagnosis not present

## 2018-06-03 DIAGNOSIS — I10 Essential (primary) hypertension: Secondary | ICD-10-CM | POA: Diagnosis not present

## 2018-06-03 DIAGNOSIS — D329 Benign neoplasm of meninges, unspecified: Secondary | ICD-10-CM | POA: Diagnosis not present

## 2018-06-03 DIAGNOSIS — R69 Illness, unspecified: Secondary | ICD-10-CM | POA: Diagnosis not present

## 2018-06-03 DIAGNOSIS — N390 Urinary tract infection, site not specified: Secondary | ICD-10-CM | POA: Diagnosis not present

## 2018-06-04 DIAGNOSIS — Z1612 Extended spectrum beta lactamase (ESBL) resistance: Secondary | ICD-10-CM | POA: Diagnosis not present

## 2018-06-04 DIAGNOSIS — E119 Type 2 diabetes mellitus without complications: Secondary | ICD-10-CM | POA: Diagnosis not present

## 2018-06-04 DIAGNOSIS — I2510 Atherosclerotic heart disease of native coronary artery without angina pectoris: Secondary | ICD-10-CM | POA: Diagnosis not present

## 2018-06-04 DIAGNOSIS — D329 Benign neoplasm of meninges, unspecified: Secondary | ICD-10-CM | POA: Diagnosis not present

## 2018-06-04 DIAGNOSIS — R69 Illness, unspecified: Secondary | ICD-10-CM | POA: Diagnosis not present

## 2018-06-04 DIAGNOSIS — N390 Urinary tract infection, site not specified: Secondary | ICD-10-CM | POA: Diagnosis not present

## 2018-06-04 DIAGNOSIS — B9620 Unspecified Escherichia coli [E. coli] as the cause of diseases classified elsewhere: Secondary | ICD-10-CM | POA: Diagnosis not present

## 2018-06-04 DIAGNOSIS — I10 Essential (primary) hypertension: Secondary | ICD-10-CM | POA: Diagnosis not present

## 2018-06-05 DIAGNOSIS — R69 Illness, unspecified: Secondary | ICD-10-CM | POA: Diagnosis not present

## 2018-06-05 DIAGNOSIS — D329 Benign neoplasm of meninges, unspecified: Secondary | ICD-10-CM | POA: Diagnosis not present

## 2018-06-05 DIAGNOSIS — I10 Essential (primary) hypertension: Secondary | ICD-10-CM | POA: Diagnosis not present

## 2018-06-05 DIAGNOSIS — N390 Urinary tract infection, site not specified: Secondary | ICD-10-CM | POA: Diagnosis not present

## 2018-06-05 DIAGNOSIS — B9620 Unspecified Escherichia coli [E. coli] as the cause of diseases classified elsewhere: Secondary | ICD-10-CM | POA: Diagnosis not present

## 2018-06-05 DIAGNOSIS — E119 Type 2 diabetes mellitus without complications: Secondary | ICD-10-CM | POA: Diagnosis not present

## 2018-06-05 DIAGNOSIS — I2510 Atherosclerotic heart disease of native coronary artery without angina pectoris: Secondary | ICD-10-CM | POA: Diagnosis not present

## 2018-06-05 DIAGNOSIS — Z1612 Extended spectrum beta lactamase (ESBL) resistance: Secondary | ICD-10-CM | POA: Diagnosis not present

## 2018-06-06 DIAGNOSIS — Z1612 Extended spectrum beta lactamase (ESBL) resistance: Secondary | ICD-10-CM | POA: Diagnosis not present

## 2018-06-06 DIAGNOSIS — E119 Type 2 diabetes mellitus without complications: Secondary | ICD-10-CM | POA: Diagnosis not present

## 2018-06-06 DIAGNOSIS — D329 Benign neoplasm of meninges, unspecified: Secondary | ICD-10-CM | POA: Diagnosis not present

## 2018-06-06 DIAGNOSIS — I2510 Atherosclerotic heart disease of native coronary artery without angina pectoris: Secondary | ICD-10-CM | POA: Diagnosis not present

## 2018-06-06 DIAGNOSIS — R69 Illness, unspecified: Secondary | ICD-10-CM | POA: Diagnosis not present

## 2018-06-06 DIAGNOSIS — B9620 Unspecified Escherichia coli [E. coli] as the cause of diseases classified elsewhere: Secondary | ICD-10-CM | POA: Diagnosis not present

## 2018-06-06 DIAGNOSIS — I10 Essential (primary) hypertension: Secondary | ICD-10-CM | POA: Diagnosis not present

## 2018-06-06 DIAGNOSIS — N390 Urinary tract infection, site not specified: Secondary | ICD-10-CM | POA: Diagnosis not present

## 2018-06-11 ENCOUNTER — Other Ambulatory Visit: Payer: Self-pay

## 2018-06-11 ENCOUNTER — Ambulatory Visit (INDEPENDENT_AMBULATORY_CARE_PROVIDER_SITE_OTHER): Payer: Medicare HMO

## 2018-06-11 DIAGNOSIS — F1011 Alcohol abuse, in remission: Secondary | ICD-10-CM

## 2018-06-11 DIAGNOSIS — R69 Illness, unspecified: Secondary | ICD-10-CM | POA: Diagnosis not present

## 2018-06-11 DIAGNOSIS — H2513 Age-related nuclear cataract, bilateral: Secondary | ICD-10-CM

## 2018-06-11 DIAGNOSIS — I2510 Atherosclerotic heart disease of native coronary artery without angina pectoris: Secondary | ICD-10-CM | POA: Diagnosis not present

## 2018-06-11 DIAGNOSIS — I10 Essential (primary) hypertension: Secondary | ICD-10-CM

## 2018-06-11 DIAGNOSIS — K219 Gastro-esophageal reflux disease without esophagitis: Secondary | ICD-10-CM

## 2018-06-11 DIAGNOSIS — E119 Type 2 diabetes mellitus without complications: Secondary | ICD-10-CM | POA: Diagnosis not present

## 2018-06-11 DIAGNOSIS — Z1612 Extended spectrum beta lactamase (ESBL) resistance: Secondary | ICD-10-CM | POA: Diagnosis not present

## 2018-06-11 DIAGNOSIS — N390 Urinary tract infection, site not specified: Secondary | ICD-10-CM

## 2018-06-11 DIAGNOSIS — D329 Benign neoplasm of meninges, unspecified: Secondary | ICD-10-CM

## 2018-06-11 DIAGNOSIS — Z9181 History of falling: Secondary | ICD-10-CM

## 2018-06-11 DIAGNOSIS — E785 Hyperlipidemia, unspecified: Secondary | ICD-10-CM

## 2018-06-11 DIAGNOSIS — B9620 Unspecified Escherichia coli [E. coli] as the cause of diseases classified elsewhere: Secondary | ICD-10-CM

## 2018-06-11 DIAGNOSIS — F419 Anxiety disorder, unspecified: Secondary | ICD-10-CM

## 2018-07-03 ENCOUNTER — Other Ambulatory Visit: Payer: Self-pay

## 2018-07-03 ENCOUNTER — Ambulatory Visit (INDEPENDENT_AMBULATORY_CARE_PROVIDER_SITE_OTHER): Payer: Medicare HMO | Admitting: Nurse Practitioner

## 2018-07-03 ENCOUNTER — Encounter: Payer: Self-pay | Admitting: Nurse Practitioner

## 2018-07-03 VITALS — BP 111/81 | HR 88 | Temp 97.6°F | Ht 60.0 in | Wt 213.0 lb

## 2018-07-03 DIAGNOSIS — R69 Illness, unspecified: Secondary | ICD-10-CM | POA: Diagnosis not present

## 2018-07-03 DIAGNOSIS — K219 Gastro-esophageal reflux disease without esophagitis: Secondary | ICD-10-CM | POA: Diagnosis not present

## 2018-07-03 DIAGNOSIS — N3281 Overactive bladder: Secondary | ICD-10-CM

## 2018-07-03 DIAGNOSIS — F339 Major depressive disorder, recurrent, unspecified: Secondary | ICD-10-CM

## 2018-07-03 DIAGNOSIS — E782 Mixed hyperlipidemia: Secondary | ICD-10-CM

## 2018-07-03 DIAGNOSIS — M810 Age-related osteoporosis without current pathological fracture: Secondary | ICD-10-CM | POA: Diagnosis not present

## 2018-07-03 DIAGNOSIS — I10 Essential (primary) hypertension: Secondary | ICD-10-CM

## 2018-07-03 MED ORDER — RISEDRONATE SODIUM 35 MG PO TABS: 35.0000 mg | ORAL_TABLET | ORAL | 1 refills | Status: DC

## 2018-07-03 MED ORDER — DARIFENACIN HYDROBROMIDE ER 7.5 MG PO TB24: 7.5000 mg | ORAL_TABLET | Freq: Every day | ORAL | 1 refills | Status: DC

## 2018-07-03 MED ORDER — SERTRALINE HCL 25 MG PO TABS: 12.5000 mg | ORAL_TABLET | Freq: Every day | ORAL | 1 refills | Status: DC | PRN

## 2018-07-03 MED ORDER — LISINOPRIL 40 MG PO TABS: 40.0000 mg | ORAL_TABLET | Freq: Every day | ORAL | 1 refills | Status: DC

## 2018-07-03 MED ORDER — MIRABEGRON ER 50 MG PO TB24: 50.0000 mg | ORAL_TABLET | Freq: Every day | ORAL | 1 refills | Status: DC

## 2018-07-03 MED ORDER — ATORVASTATIN CALCIUM 10 MG PO TABS: 10.0000 mg | ORAL_TABLET | Freq: Every day | ORAL | 1 refills | Status: DC

## 2018-07-03 MED ORDER — OMEPRAZOLE 40 MG PO CPDR: 40.0000 mg | DELAYED_RELEASE_CAPSULE | Freq: Every day | ORAL | 1 refills | Status: DC

## 2018-07-03 NOTE — Patient Instructions (Signed)

## 2018-07-03 NOTE — Progress Notes (Signed)
Subjective:    Patient ID: Tracie Wiggins, female    DOB: [birth year], 73 y.o.   MRN: [id number]   Chief Complaint: Medical Management of Chronic Issues    HPI:  1. Essential hypertension No c/o chest , sob or headaches. Does not check blood pressure at home. BP Readings from Last 3 Encounters:  07/03/18 111/81  06/02/18 (!) 138/95  10/08/17 (!) 142/88     2. Mixed hyperlipidemia Does not watch diet and does no exercise  3. Recurrent major depressive disorder, remission status unspecified (Tracie Wiggins) She is currently on zoloft daily. Denies any depression. Depression screen Tracie Wiggins 2/9 07/03/2018 06/02/2018 04/10/2018  Decreased Interest 0 0 1  Down, Depressed, Hopeless 0 0 0  PHQ - 2 Score 0 0 1     4. OAB (overactive bladder) Is on myrbetriq and enablex. Still has to go frequently but is better.  5. Gastroesophageal reflux disease without esophagitis Is on omeprazole daily and works well to keep symptoms under control.  6. Age-related osteoporosis without current pathological fracture Last dexascan was done 05/06/17 with t score of -2.7.  She Is on actonel. Is not able to do weight bearing exercises due to weakness she says. She walks with cane for balalnce  7. Morbid obesity (Tracie Wiggins) No recent weight changes    Outpatient Encounter Medications as of 07/03/2018  Medication Sig  . atorvastatin (LIPITOR) 10 MG tablet Take 1 tablet (10 mg total) by mouth daily.  . Cholecalciferol (VITAMIN D) 2000 units CAPS Take 1 capsule by mouth daily.  [name] darifenacin (ENABLEX) 7.5 MG 24 hr tablet Take 1 tablet (7.5 mg total) by mouth daily.  . Incontinence Supply Disposable (DEPEND SILHOUETTE BRIEFS L/XL) MISC 1 each by Does not apply route 3 (three) times daily.  . Incontinence Supply Disposable (POISE HOURGLASS SHAPE) PADS 1 each by Does not apply route 4 (four) times daily - after meals and at bedtime.  [name] lisinopril (PRINIVIL,ZESTRIL) 40 MG tablet Take 1 tablet (40 mg total) by mouth daily.  .  mirabegron ER (MYRBETRIQ) 50 MG TB24 tablet Take 1 tablet (50 mg total) by mouth daily.  . Omega-3 Fatty Acids (FISH OIL) 1000 MG CAPS Take 1 capsule (1,000 mg total) by mouth daily.  [name] omeprazole (PRILOSEC) 40 MG capsule Take 1 capsule (40 mg total) by mouth daily.  . risedronate (ACTONEL) 35 MG tablet Take 1 tablet (35 mg total) by mouth every 7 (seven) days. with water on empty stomach, nothing by mouth or lie down for next 30 minutes.  . sertraline (ZOLOFT) 25 MG tablet Take 0.5 tablets (12.5 mg total) by mouth daily as needed (for anxiety). Only takes 0.5 tablet    Past Surgical History:  Procedure Laterality Date  . ABDOMINAL HYSTERECTOMY    . APPENDECTOMY    . BIOPSY BREAST Right    Fluid filled cyst  . CATARACT EXTRACTION W/PHACO Left 07/20/2016   Procedure: CATARACT EXTRACTION PHACO AND INTRAOCULAR LENS PLACEMENT (IOC);  Surgeon: Baruch Goldmann, MD;  Location: [facility];  Service: Ophthalmology;  Laterality: Left;  CDE: 3.19  . CATARACT EXTRACTION W/PHACO Right 09/07/2016   Procedure: CATARACT EXTRACTION PHACO AND INTRAOCULAR LENS PLACEMENT (IOC);  Surgeon: Baruch Goldmann, MD;  Location: [facility];  Service: Ophthalmology;  Laterality: Right;  CDE: 5.04  . CHOLECYSTECTOMY    . NASAL SINUS SURGERY      Family History  Problem Relation Age of Onset  . COPD Mother   . Stroke Mother   . Heart disease Father   .  Heart attack Father   . Heart disease Sister   . Diabetes Sister   . Heart attack Sister   . Stroke Sister   . Healthy Daughter     New complaints: None today  Social history: Lives by herself and still drives. She has family and friends that check on her daily.   Review of Systems  Constitutional: Negative for activity change and appetite change.  HENT: Negative.   Eyes: Negative for pain.  Respiratory: Negative for shortness of breath.   Cardiovascular: Negative for chest pain, palpitations and leg swelling.  Gastrointestinal: Negative for abdominal pain.   Endocrine: Negative for polydipsia.  Genitourinary: Negative.   Skin: Negative for rash.  Neurological: Negative for dizziness, weakness and headaches.  Hematological: Does not bruise/bleed easily.  Psychiatric/Behavioral: Negative.   All other systems reviewed and are negative.      Objective:   Physical Exam Vitals signs and nursing note reviewed.  Constitutional:      General: She is not in acute distress.    Appearance: Normal appearance. She is well-developed.  HENT:     Head: Normocephalic.     Nose: Nose normal.  Eyes:     Pupils: Pupils are equal, round, and reactive to light.  Neck:     Musculoskeletal: Normal range of motion and neck supple.     Vascular: No carotid bruit or JVD.  Cardiovascular:     Rate and Rhythm: Normal rate and regular rhythm.     Heart sounds: Normal heart sounds.  Pulmonary:     Effort: Pulmonary effort is normal. No respiratory distress.     Breath sounds: Normal breath sounds. No wheezing or rales.  Chest:     Chest wall: No tenderness.  Abdominal:     General: Bowel sounds are normal. There is no distension or abdominal bruit.     Palpations: Abdomen is soft. There is no hepatomegaly, splenomegaly, mass or pulsatile mass.     Tenderness: There is no abdominal tenderness.  Musculoskeletal: Normal range of motion.  Lymphadenopathy:     Cervical: No cervical adenopathy.  Skin:    General: Skin is warm and dry.  Neurological:     Mental Status: She is alert and oriented to person, place, and time.     Deep Tendon Reflexes: Reflexes are normal and symmetric.  Psychiatric:        Behavior: Behavior normal.        Thought Content: Thought content normal.        Judgment: Judgment normal.    BP 111/81   Pulse 88   Temp 97.6 F (36.4 C) (Oral)   Ht 5' (1.524 m)   Wt 213 lb (96.6 kg)   BMI 41.60 kg/m         Assessment & Plan:  Tracie Wiggins comes in today with chief complaint of Medical Management of Chronic Issues    Diagnosis and orders addressed:  1. Essential hypertension Low sodium diet - lisinopril (ZESTRIL) 40 MG tablet; Take 1 tablet (40 mg total) by mouth daily.  Dispense: 90 tablet; Refill: 1 - CMP14+EGFR  2. Mixed hyperlipidemia Low fat diet - atorvastatin (LIPITOR) 10 MG tablet; Take 1 tablet (10 mg total) by mouth daily.  Dispense: 90 tablet; Refill: 1 - Lipid panel  3. Recurrent major depressive disorder, remission status unspecified (HCC) Stress management - sertraline (ZOLOFT) 25 MG tablet; Take 0.5 tablets (12.5 mg total) by mouth daily as needed (for anxiety). Only takes 0.5 tablet  Dispense: 90 tablet; Refill: 1  4. OAB (overactive bladder) - darifenacin (ENABLEX) 7.5 MG 24 hr tablet; Take 1 tablet (7.5 mg total) by mouth daily.  Dispense: 90 tablet; Refill: 1 - mirabegron ER (MYRBETRIQ) 50 MG TB24 tablet; Take 1 tablet (50 mg total) by mouth daily.  Dispense: 90 tablet; Refill: 1  5. Gastroesophageal reflux disease without esophagitis Avoid spicy foods Do not eat 2 hours prior to bedtime - omeprazole (PRILOSEC) 40 MG capsule; Take 1 capsule (40 mg total) by mouth daily.  Dispense: 90 capsule; Refill: 1  6. Age-related osteoporosis without current pathological fracture Weight bearing exercises as can tolerate - risedronate (ACTONEL) 35 MG tablet; Take 1 tablet (35 mg total) by mouth every 7 (seven) days. with water on empty stomach, nothing by mouth or lie down for next 30 minutes.  Dispense: 90 tablet; Refill: 1  7. Morbid obesity ([place]) Discussed diet and exercise for person with BMI >25 Will recheck weight in 3-6 months   Labs pending Health Maintenance reviewed Diet and exercise encouraged  Follow up plan: 6 months   Mary-Margaret Hassell Done, FNP

## 2018-07-04 LAB — LIPID PANEL
Chol/HDL Ratio: 2.4 ratio (ref 0.0–4.4)
Cholesterol, Total: 162 mg/dL (ref 100–199)
HDL: 67 mg/dL (ref >39)
LDL Calculated: 75 mg/dL (ref 0–99)
Triglycerides: 99 mg/dL (ref 0–149)
VLDL Cholesterol Cal: 20 mg/dL (ref 5–40)

## 2018-07-04 LAB — CMP14+EGFR
ALT: 15 IU/L (ref 0–32)
AST: 16 IU/L (ref 0–40)
Albumin/Globulin Ratio: 1.7 (ref 1.2–2.2)
Albumin: 4.1 g/dL (ref 3.7–4.7)
Alkaline Phosphatase: 104 IU/L (ref 39–117)
BUN/Creatinine Ratio: 12 (ref 12–28)
BUN: 16 mg/dL (ref 8–27)
Bilirubin Total: 0.5 mg/dL (ref 0.0–1.2)
CO2: 21 mmol/L (ref 20–29)
Calcium: 9.5 mg/dL (ref 8.7–10.3)
Chloride: 104 mmol/L (ref 96–106)
Creatinine, Ser: 1.32 mg/dL — ABNORMAL HIGH (ref 0.57–1.00)
GFR calc Af Amer: 46 mL/min/{1.73_m2} — ABNORMAL LOW (ref >59)
GFR calc non Af Amer: 40 mL/min/{1.73_m2} — ABNORMAL LOW (ref >59)
Globulin, Total: 2.4 g/dL (ref 1.5–4.5)
Glucose: 111 mg/dL — ABNORMAL HIGH (ref 65–99)
Potassium: 4 mmol/L (ref 3.5–5.2)
Sodium: 143 mmol/L (ref 134–144)
Total Protein: 6.5 g/dL (ref 6.0–8.5)

## 2018-07-09 ENCOUNTER — Telehealth: Payer: Self-pay | Admitting: Nurse Practitioner

## 2018-07-10 ENCOUNTER — Ambulatory Visit: Payer: Medicare HMO | Admitting: Nurse Practitioner

## 2018-07-10 NOTE — Telephone Encounter (Signed)
Patient states that her [employer] was canceled.  Informed patient that she will need to call and see why this was canceled.

## 2018-07-10 NOTE — Telephone Encounter (Signed)
Please find out what is going on?

## 2018-07-10 NOTE — Telephone Encounter (Signed)
Patient aware to contact insurance company to see why insurance was canceled

## 2018-07-22 ENCOUNTER — Other Ambulatory Visit (HOSPITAL_COMMUNITY): Payer: Self-pay | Admitting: Nurse Practitioner

## 2018-07-22 DIAGNOSIS — Z1231 Encounter for screening mammogram for malignant neoplasm of breast: Secondary | ICD-10-CM

## 2018-11-04 DIAGNOSIS — R69 Illness, unspecified: Secondary | ICD-10-CM | POA: Diagnosis not present

## 2018-11-24 ENCOUNTER — Ambulatory Visit (INDEPENDENT_AMBULATORY_CARE_PROVIDER_SITE_OTHER): Payer: Medicare HMO | Admitting: Family

## 2018-11-24 ENCOUNTER — Encounter: Payer: Self-pay | Admitting: Family

## 2018-11-24 ENCOUNTER — Other Ambulatory Visit: Payer: Self-pay

## 2018-11-24 DIAGNOSIS — I10 Essential (primary) hypertension: Secondary | ICD-10-CM | POA: Diagnosis not present

## 2018-11-24 DIAGNOSIS — R197 Diarrhea, unspecified: Secondary | ICD-10-CM | POA: Diagnosis not present

## 2018-11-24 NOTE — Progress Notes (Signed)
° °  Virtual Visit via telephone Note Due to COVID-19 pandemic this visit was conducted virtually. This visit type was conducted due to national recommendations for restrictions regarding the COVID-19 Pandemic (e.g. social distancing, sheltering in place) in an effort to limit this patient's exposure and mitigate transmission in our community. All issues noted in this document were discussed and addressed.  A physical exam was not performed with this format.  I connected with Tracie Wiggins on 11/24/18 at 10:50 AM  by telephone and verified that I am speaking with the correct person using two identifiers. Tracie Wiggins is currently located at home and no one is currently with her during visit. The provider, Evelina Dun, FNP is located in their office at time of visit.  I discussed the limitations, risks, security and privacy concerns of performing an evaluation and management service by telephone and the availability of in person appointments. I also discussed with the patient that there may be a patient responsible charge related to this service. The patient expressed understanding and agreed to proceed.   History and Present Illness:  Pt calls the office today with complaints of diarrhea for 2-3 days, but has resolved now. She states she feels better now. She is worried that her lisinopril  medication would not cause diarrhea.  Diarrhea  This is a new problem. The current episode started in the past 7 days. Pertinent negatives include no headaches.  Hypertension This is a chronic problem. The current episode started more than 1 year ago. The problem has been resolved since onset. The problem is controlled. Associated symptoms include malaise/fatigue. Pertinent negatives include no headaches, peripheral edema or shortness of breath. Risk factors for coronary artery disease include dyslipidemia, obesity and sedentary lifestyle. Past treatments include ACE inhibitors. The current treatment  provides moderate improvement. There is no history of kidney disease or heart failure.   BP-139/85   Review of Systems  Constitutional: Positive for malaise/fatigue.  Respiratory: Negative for shortness of breath.   Gastrointestinal: Positive for diarrhea.  Neurological: Negative for headaches.     Observations/Objective: No SOB or distress   Assessment and Plan: 1. Diarrhea, unspecified type This has resolved. Discussed with patient that I do not believe that any of there medications caused this. She has been on the same medications for "years". I believe this was related to a virus. If her symptoms return or get any URI symptoms she will need to be tested for COVID.  2. Essential hypertension Continue all medications.        I discussed the assessment and treatment plan with the patient. The patient was provided an opportunity to ask questions and all were answered. The patient agreed with the plan and demonstrated an understanding of the instructions.   The patient was advised to call back or seek an in-person evaluation if the symptoms worsen or if the condition fails to improve as anticipated.  The above assessment and management plan was discussed with the patient. The patient verbalized understanding of and has agreed to the management plan. Patient is aware to call the clinic if symptoms persist or worsen. Patient is aware when to return to the clinic for a follow-up visit. Patient educated on when it is appropriate to go to the emergency department.   Time call ended:  11:05 AM  I provided 15 minutes of non-face-to-face time during this encounter.    Evelina Dun, FNP

## 2019-01-05 ENCOUNTER — Ambulatory Visit (INDEPENDENT_AMBULATORY_CARE_PROVIDER_SITE_OTHER): Payer: Medicare HMO | Admitting: Nurse Practitioner

## 2019-01-05 ENCOUNTER — Encounter: Payer: Self-pay | Admitting: Nurse Practitioner

## 2019-01-05 DIAGNOSIS — K219 Gastro-esophageal reflux disease without esophagitis: Secondary | ICD-10-CM | POA: Diagnosis not present

## 2019-01-05 DIAGNOSIS — N3281 Overactive bladder: Secondary | ICD-10-CM | POA: Diagnosis not present

## 2019-01-05 DIAGNOSIS — R69 Illness, unspecified: Secondary | ICD-10-CM | POA: Diagnosis not present

## 2019-01-05 DIAGNOSIS — I10 Essential (primary) hypertension: Secondary | ICD-10-CM | POA: Diagnosis not present

## 2019-01-05 DIAGNOSIS — M810 Age-related osteoporosis without current pathological fracture: Secondary | ICD-10-CM

## 2019-01-05 DIAGNOSIS — F339 Major depressive disorder, recurrent, unspecified: Secondary | ICD-10-CM

## 2019-01-05 DIAGNOSIS — E782 Mixed hyperlipidemia: Secondary | ICD-10-CM

## 2019-01-05 MED ORDER — SERTRALINE HCL 25 MG PO TABS: 12.5000 mg | ORAL_TABLET | Freq: Every day | ORAL | 1 refills | Status: DC | PRN

## 2019-01-05 MED ORDER — LISINOPRIL 40 MG PO TABS: 40.0000 mg | ORAL_TABLET | Freq: Every day | ORAL | 1 refills | Status: DC

## 2019-01-05 MED ORDER — ATORVASTATIN CALCIUM 10 MG PO TABS: 10.0000 mg | ORAL_TABLET | Freq: Every day | ORAL | 1 refills | Status: DC

## 2019-01-05 MED ORDER — DARIFENACIN HYDROBROMIDE ER 7.5 MG PO TB24: 7.5000 mg | ORAL_TABLET | Freq: Every day | ORAL | 1 refills | Status: DC

## 2019-01-05 MED ORDER — OMEPRAZOLE 40 MG PO CPDR: 40.0000 mg | DELAYED_RELEASE_CAPSULE | Freq: Every day | ORAL | 1 refills | Status: DC

## 2019-01-05 MED ORDER — MIRABEGRON ER 50 MG PO TB24: 50.0000 mg | ORAL_TABLET | Freq: Every day | ORAL | 1 refills | Status: DC

## 2019-01-05 MED ORDER — RISEDRONATE SODIUM 35 MG PO TABS: 35.0000 mg | ORAL_TABLET | ORAL | 1 refills | Status: DC

## 2019-01-05 NOTE — Progress Notes (Signed)
Virtual Visit via telephone Note Due to COVID-19 pandemic this visit was conducted virtually. This visit type was conducted due to national recommendations for restrictions regarding the COVID-19 Pandemic (e.g. social distancing, sheltering in place) in an effort to limit this patient's exposure and mitigate transmission in our community. All issues noted in this document were discussed and addressed.  A physical exam was not performed with this format.  I connected with Tracie Wiggins on 01/05/19 at 1:55 by telephone and verified that I am speaking with the correct person using two identifiers. Tracie Wiggins is currently located at [facility] and   is currently with no one during visit. The provider, Mary-Margaret Hassell Done, FNP is located in their office at time of visit.  I discussed the limitations, risks, security and privacy concerns of performing an evaluation and management service by telephone and the availability of in person appointments. I also discussed with the patient that there may be a patient responsible charge related to this service. The patient expressed understanding and agreed to proceed.   History and Present Illness:  Chief Complaint: Medical Management of Chronic Issues    HPI:  1. Essential hypertension No c/o chest pain, sob or headache. Does not check blood pressure at home. BP Readings from Last 3 Encounters:  07/03/18 111/81  06/02/18 (!) 138/95  10/08/17 (!) 142/88     2. Mixed hyperlipidemia Does not watch diet and does no exercise. Lab Results  Component Value Date   CHOL 162 07/03/2018   HDL 67 07/03/2018   LDLCALC 75 07/03/2018   TRIG 99 07/03/2018   CHOLHDL 2.4 07/03/2018     3. Gastroesophageal reflux disease without esophagitis Is on omeprazole daily and works well to keep symptoms under control.  4. OAB (overactive bladder) Is on myrbetriq and enablex and still has to void frequently. Does not see urologist anymore.  5. Age-related  osteoporosis without current pathological fracture Last dexacan was done on 05/09/17 with tscore of -2.7. patient does no weight bearing exercises  6. Recurrent major depressive disorder, remission status unspecified ([place]) She is on zoloft and is doing well. Depression screen [facility] 2/9 01/05/2019 07/03/2018 06/02/2018  Decreased Interest 0 0 0  Down, Depressed, Hopeless 0 0 0  PHQ - 2 Score 0 0 0     7. Morbid obesity ([place]) No recent weight changes Wt Readings from Last 3 Encounters:  07/03/18 213 lb (96.6 kg)  06/02/18 221 lb (100.2 kg)  10/08/17 233 lb (105.7 kg)   BMI Readings from Last 3 Encounters:  07/03/18 41.60 kg/m  06/02/18 43.16 kg/m  10/08/17 45.50 kg/m       Outpatient Encounter Medications as of 01/05/2019  Medication Sig  . atorvastatin (LIPITOR) 10 MG tablet Take 1 tablet (10 mg total) by mouth daily.  . Cholecalciferol (VITAMIN D) 2000 units CAPS Take 1 capsule by mouth daily.  [name] darifenacin (ENABLEX) 7.5 MG 24 hr tablet Take 1 tablet (7.5 mg total) by mouth daily.  . Incontinence Supply Disposable (DEPEND SILHOUETTE BRIEFS L/XL) MISC 1 each by Does not apply route 3 (three) times daily.  . Incontinence Supply Disposable (POISE HOURGLASS SHAPE) PADS 1 each by Does not apply route 4 (four) times daily - after meals and at bedtime.  [name] lisinopril (ZESTRIL) 40 MG tablet Take 1 tablet (40 mg total) by mouth daily.  . mirabegron ER (MYRBETRIQ) 50 MG TB24 tablet Take 1 tablet (50 mg total) by mouth daily.  . Omega-3 Fatty Acids (FISH OIL)  1000 MG CAPS Take 1 capsule (1,000 mg total) by mouth daily.  [name] omeprazole (PRILOSEC) 40 MG capsule Take 1 capsule (40 mg total) by mouth daily.  . risedronate (ACTONEL) 35 MG tablet Take 1 tablet (35 mg total) by mouth every 7 (seven) days. with water on empty stomach, nothing by mouth or lie down for next 30 minutes.  . sertraline (ZOLOFT) 25 MG tablet Take 0.5 tablets (12.5 mg total) by mouth daily as needed (for anxiety). Only takes 0.5  tablet    Past Surgical History:  Procedure Laterality Date  . ABDOMINAL HYSTERECTOMY    . APPENDECTOMY    . BIOPSY BREAST Right    Fluid filled cyst  . CATARACT EXTRACTION W/PHACO Left 07/20/2016   Procedure: CATARACT EXTRACTION PHACO AND INTRAOCULAR LENS PLACEMENT (IOC);  Surgeon: Baruch Goldmann, MD;  Location: [facility];  Service: Ophthalmology;  Laterality: Left;  CDE: 3.19  . CATARACT EXTRACTION W/PHACO Right 09/07/2016   Procedure: CATARACT EXTRACTION PHACO AND INTRAOCULAR LENS PLACEMENT (IOC);  Surgeon: Baruch Goldmann, MD;  Location: [facility];  Service: Ophthalmology;  Laterality: Right;  CDE: 5.04  . CHOLECYSTECTOMY    . NASAL SINUS SURGERY      Family History  Problem Relation Age of Onset  . COPD Mother   . Stroke Mother   . Heart disease Father   . Heart attack Father   . Heart disease Sister   . Diabetes Sister   . Heart attack Sister   . Stroke Sister   . Healthy Daughter     New complaints: None today  Social history: Lives by herself with her dog and cat  Controlled substance contract: n/a    Review of Systems  Constitutional: Negative for diaphoresis and weight loss.  Eyes: Negative for blurred vision, double vision and pain.  Respiratory: Negative for shortness of breath.   Cardiovascular: Negative for chest pain, palpitations, orthopnea and leg swelling.  Gastrointestinal: Negative for abdominal pain.  Genitourinary: Positive for frequency and urgency.  Skin: Negative for rash.  Neurological: Negative for dizziness, sensory change, loss of consciousness, weakness and headaches.  Endo/Heme/Allergies: Negative for polydipsia. Does not bruise/bleed easily.  Psychiatric/Behavioral: Negative for memory loss. The patient does not have insomnia.   All other systems reviewed and are negative.    Observations/Objective: Alert and oriented- answers all questions appropriately No distress    Assessment and Plan: Tracie Wiggins comes in today with chief  complaint of Medical Management of Chronic Issues   Diagnosis and orders addressed:  1. Essential hypertension Low sodium diet - lisinopril (ZESTRIL) 40 MG tablet; Take 1 tablet (40 mg total) by mouth daily.  Dispense: 90 tablet; Refill: 1  2. Mixed hyperlipidemia Low fat diet - atorvastatin (LIPITOR) 10 MG tablet; Take 1 tablet (10 mg total) by mouth daily.  Dispense: 90 tablet; Refill: 1  3. Gastroesophageal reflux disease without esophagitis Avoid spicy foods Do not eat 2 hours prior to bedtime - omeprazole (PRILOSEC) 40 MG capsule; Take 1 capsule (40 mg total) by mouth daily.  Dispense: 90 capsule; Refill: 1  4. OAB (overactive bladder) Needs to see urology - darifenacin (ENABLEX) 7.5 MG 24 hr tablet; Take 1 tablet (7.5 mg total) by mouth daily.  Dispense: 90 tablet; Refill: 1 - mirabegron ER (MYRBETRIQ) 50 MG TB24 tablet; Take 1 tablet (50 mg total) by mouth daily.  Dispense: 90 tablet; Refill: 1  5. Age-related osteoporosis without current pathological fracture Weight bearing exercise encouraged - risedronate (ACTONEL) 35 MG tablet; Take  1 tablet (35 mg total) by mouth every 7 (seven) days. with water on empty stomach, nothing by mouth or lie down for next 30 minutes.  Dispense: 90 tablet; Refill: 1  6. Recurrent major depressive disorder, remission status unspecified ([facility]) Stress management - sertraline (ZOLOFT) 25 MG tablet; Take 0.5 tablets (12.5 mg total) by mouth daily as needed (for anxiety). Only takes 0.5 tablet  Dispense: 90 tablet; Refill: 1  7. Morbid obesity ([place]) Discussed diet and exercise for person with BMI >25 Will recheck weight in 3-6 months   Labs pending Health Maintenance reviewed Diet and exercise encouraged  Follow up plan: 3 months     I discussed the assessment and treatment plan with the patient. The patient was provided an opportunity to ask questions and all were answered. The patient agreed with the plan and demonstrated an  understanding of the instructions.   The patient was advised to call back or seek an in-person evaluation if the symptoms worsen or if the condition fails to improve as anticipated.  The above assessment and management plan was discussed with the patient. The patient verbalized understanding of and has agreed to the management plan. Patient is aware to call the clinic if symptoms persist or worsen. Patient is aware when to return to the clinic for a follow-up visit. Patient educated on when it is appropriate to go to the emergency department.   Time call ended:  2:10  I provided 15 minutes of non-face-to-face time during this encounter.    Mary-Margaret Hassell Done, FNP

## 2019-01-15 DIAGNOSIS — R69 Illness, unspecified: Secondary | ICD-10-CM | POA: Diagnosis not present

## 2019-01-15 DIAGNOSIS — R5381 Other malaise: Secondary | ICD-10-CM | POA: Diagnosis not present

## 2019-01-15 DIAGNOSIS — W19XXXA Unspecified fall, initial encounter: Secondary | ICD-10-CM | POA: Diagnosis not present

## 2019-01-16 ENCOUNTER — Telehealth: Payer: Self-pay | Admitting: Nurse Practitioner

## 2019-01-16 NOTE — Telephone Encounter (Signed)
Patient has a follow up appointment scheduled. 

## 2019-01-19 ENCOUNTER — Other Ambulatory Visit: Payer: Self-pay

## 2019-01-20 ENCOUNTER — Encounter: Payer: Self-pay | Admitting: Nurse Practitioner

## 2019-01-20 ENCOUNTER — Ambulatory Visit (INDEPENDENT_AMBULATORY_CARE_PROVIDER_SITE_OTHER): Payer: Medicare HMO | Admitting: Nurse Practitioner

## 2019-01-20 VITALS — BP 167/92 | HR 75 | Temp 97.1°F | Resp 20 | Ht 60.0 in | Wt 206.0 lb

## 2019-01-20 DIAGNOSIS — R2681 Unsteadiness on feet: Secondary | ICD-10-CM | POA: Diagnosis not present

## 2019-01-20 NOTE — Progress Notes (Signed)
   Subjective:    Patient ID: Tracie Wiggins, female    DOB: [birth year], 74 y.o.   MRN: [id number]   Chief Complaint: Fall   HPI Patient comes in c/o of fall on 01/15/19 and hit the floor. She is not sure what made her fall. She called EMS and they checked her out and said she looked fine. Meaning no broken bones. Patient says she is not hurting anywhere but would like physical therapy for balance and strength.   Review of Systems  Constitutional: Negative for diaphoresis.  Eyes: Negative for pain.  Respiratory: Negative for shortness of breath.   Cardiovascular: Negative for chest pain, palpitations and leg swelling.  Gastrointestinal: Negative for abdominal pain.  Endocrine: Negative for polydipsia.  Musculoskeletal: Negative for arthralgias, back pain and myalgias.  Skin: Negative for rash.  Neurological: Negative for dizziness, weakness and headaches.  Hematological: Does not bruise/bleed easily.  All other systems reviewed and are negative.      Objective:   Physical Exam Vitals and nursing note reviewed.  Constitutional:      General: She is not in acute distress.    Appearance: Normal appearance. She is well-developed.  HENT:     Head: Normocephalic.     Nose: Nose normal.  Eyes:     Pupils: Pupils are equal, round, and reactive to light.  Neck:     Vascular: No carotid bruit or JVD.  Cardiovascular:     Rate and Rhythm: Normal rate and regular rhythm.     Heart sounds: Normal heart sounds.  Pulmonary:     Effort: Pulmonary effort is normal. No respiratory distress.     Breath sounds: Normal breath sounds. No wheezing or rales.  Chest:     Chest wall: No tenderness.  Abdominal:     General: Bowel sounds are normal. There is no distension or abdominal bruit.     Palpations: Abdomen is soft. There is no hepatomegaly, splenomegaly, mass or pulsatile mass.     Tenderness: There is no abdominal tenderness.  Musculoskeletal:        General: Normal range of  motion.     Cervical back: Normal range of motion and neck supple.  Lymphadenopathy:     Cervical: No cervical adenopathy.  Skin:    General: Skin is warm and dry.  Neurological:     Mental Status: She is alert and oriented to person, place, and time.     Deep Tendon Reflexes: Reflexes are normal and symmetric.  Psychiatric:        Behavior: Behavior normal.        Thought Content: Thought content normal.        Judgment: Judgment normal.   BP (!) 167/92   Pulse 75   Temp (!) 97.1 F (36.2 C) (Temporal)   Resp 20   Ht 5' (1.524 m)   Wt 206 lb (93.4 kg)   SpO2 99%   BMI 40.23 kg/m          Assessment & Plan:  Tracie Wiggins in today with chief complaint of Fall   1. Unsteady gait Fall prevention information given - Ambulatory referral to [place], [place]

## 2019-01-20 NOTE — Patient Instructions (Signed)

## 2019-01-28 ENCOUNTER — Telehealth: Payer: Self-pay | Admitting: Nurse Practitioner

## 2019-02-04 ENCOUNTER — Telehealth: Payer: Self-pay | Admitting: Nurse Practitioner

## 2019-02-04 NOTE — Telephone Encounter (Signed)
TC to Tracie Wiggins w/ [facility]. Pt does not have [employer] ins which would have been a $50/visit copay. She has [employer] which is a $100/visit copay.  Pt does not have VM setup, unable to reach her to let her know I have sent this referral to [facility] to see if they can pick up her referral. Currently several of the agency's are having staffing issues.

## 2019-02-04 NOTE — Telephone Encounter (Signed)
LMOVM about HH in process

## 2019-02-04 NOTE — Telephone Encounter (Signed)
Spoke to [employer], she is checking on this

## 2019-02-06 DIAGNOSIS — I10 Essential (primary) hypertension: Secondary | ICD-10-CM | POA: Diagnosis not present

## 2019-02-06 DIAGNOSIS — K219 Gastro-esophageal reflux disease without esophagitis: Secondary | ICD-10-CM | POA: Diagnosis not present

## 2019-02-06 DIAGNOSIS — N3281 Overactive bladder: Secondary | ICD-10-CM | POA: Diagnosis not present

## 2019-02-06 DIAGNOSIS — E782 Mixed hyperlipidemia: Secondary | ICD-10-CM | POA: Diagnosis not present

## 2019-02-06 DIAGNOSIS — Z9181 History of falling: Secondary | ICD-10-CM | POA: Diagnosis not present

## 2019-02-06 DIAGNOSIS — Z6837 Body mass index (BMI) 37.0-37.9, adult: Secondary | ICD-10-CM | POA: Diagnosis not present

## 2019-02-06 DIAGNOSIS — R2681 Unsteadiness on feet: Secondary | ICD-10-CM | POA: Diagnosis not present

## 2019-02-06 DIAGNOSIS — M810 Age-related osteoporosis without current pathological fracture: Secondary | ICD-10-CM | POA: Diagnosis not present

## 2019-02-06 DIAGNOSIS — R69 Illness, unspecified: Secondary | ICD-10-CM | POA: Diagnosis not present

## 2019-02-10 DIAGNOSIS — I10 Essential (primary) hypertension: Secondary | ICD-10-CM | POA: Diagnosis not present

## 2019-02-10 DIAGNOSIS — R2681 Unsteadiness on feet: Secondary | ICD-10-CM | POA: Diagnosis not present

## 2019-02-10 DIAGNOSIS — R69 Illness, unspecified: Secondary | ICD-10-CM | POA: Diagnosis not present

## 2019-02-10 DIAGNOSIS — Z9181 History of falling: Secondary | ICD-10-CM | POA: Diagnosis not present

## 2019-02-10 DIAGNOSIS — M810 Age-related osteoporosis without current pathological fracture: Secondary | ICD-10-CM | POA: Diagnosis not present

## 2019-02-10 DIAGNOSIS — Z6837 Body mass index (BMI) 37.0-37.9, adult: Secondary | ICD-10-CM | POA: Diagnosis not present

## 2019-02-10 DIAGNOSIS — N3281 Overactive bladder: Secondary | ICD-10-CM | POA: Diagnosis not present

## 2019-02-10 DIAGNOSIS — E782 Mixed hyperlipidemia: Secondary | ICD-10-CM | POA: Diagnosis not present

## 2019-02-10 DIAGNOSIS — K219 Gastro-esophageal reflux disease without esophagitis: Secondary | ICD-10-CM | POA: Diagnosis not present

## 2019-02-12 DIAGNOSIS — R69 Illness, unspecified: Secondary | ICD-10-CM | POA: Diagnosis not present

## 2019-02-12 DIAGNOSIS — Z6837 Body mass index (BMI) 37.0-37.9, adult: Secondary | ICD-10-CM | POA: Diagnosis not present

## 2019-02-12 DIAGNOSIS — I10 Essential (primary) hypertension: Secondary | ICD-10-CM | POA: Diagnosis not present

## 2019-02-12 DIAGNOSIS — E782 Mixed hyperlipidemia: Secondary | ICD-10-CM | POA: Diagnosis not present

## 2019-02-12 DIAGNOSIS — R2681 Unsteadiness on feet: Secondary | ICD-10-CM | POA: Diagnosis not present

## 2019-02-12 DIAGNOSIS — M810 Age-related osteoporosis without current pathological fracture: Secondary | ICD-10-CM | POA: Diagnosis not present

## 2019-02-12 DIAGNOSIS — K219 Gastro-esophageal reflux disease without esophagitis: Secondary | ICD-10-CM | POA: Diagnosis not present

## 2019-02-12 DIAGNOSIS — N3281 Overactive bladder: Secondary | ICD-10-CM | POA: Diagnosis not present

## 2019-02-12 DIAGNOSIS — Z9181 History of falling: Secondary | ICD-10-CM | POA: Diagnosis not present

## 2019-02-13 ENCOUNTER — Telehealth: Payer: Self-pay

## 2019-02-13 NOTE — Telephone Encounter (Signed)
Bethena Roys with [facility] called stating that they did a speech therapy evaluation today and patient does not want any speech therapy at this time. Just wanted to let you know they are D/C

## 2019-02-17 ENCOUNTER — Other Ambulatory Visit: Payer: Self-pay

## 2019-02-17 ENCOUNTER — Ambulatory Visit (INDEPENDENT_AMBULATORY_CARE_PROVIDER_SITE_OTHER): Payer: Medicare HMO

## 2019-02-17 DIAGNOSIS — R2681 Unsteadiness on feet: Secondary | ICD-10-CM

## 2019-02-17 DIAGNOSIS — R69 Illness, unspecified: Secondary | ICD-10-CM | POA: Diagnosis not present

## 2019-02-17 DIAGNOSIS — E782 Mixed hyperlipidemia: Secondary | ICD-10-CM | POA: Diagnosis not present

## 2019-02-17 DIAGNOSIS — F339 Major depressive disorder, recurrent, unspecified: Secondary | ICD-10-CM

## 2019-02-17 DIAGNOSIS — M810 Age-related osteoporosis without current pathological fracture: Secondary | ICD-10-CM

## 2019-02-17 DIAGNOSIS — K219 Gastro-esophageal reflux disease without esophagitis: Secondary | ICD-10-CM

## 2019-02-17 DIAGNOSIS — Z6837 Body mass index (BMI) 37.0-37.9, adult: Secondary | ICD-10-CM

## 2019-02-17 DIAGNOSIS — I10 Essential (primary) hypertension: Secondary | ICD-10-CM | POA: Diagnosis not present

## 2019-02-17 DIAGNOSIS — N3281 Overactive bladder: Secondary | ICD-10-CM

## 2019-02-17 DIAGNOSIS — Z9181 History of falling: Secondary | ICD-10-CM

## 2019-02-24 DIAGNOSIS — I10 Essential (primary) hypertension: Secondary | ICD-10-CM | POA: Diagnosis not present

## 2019-02-24 DIAGNOSIS — R2681 Unsteadiness on feet: Secondary | ICD-10-CM | POA: Diagnosis not present

## 2019-02-24 DIAGNOSIS — E782 Mixed hyperlipidemia: Secondary | ICD-10-CM | POA: Diagnosis not present

## 2019-02-24 DIAGNOSIS — M810 Age-related osteoporosis without current pathological fracture: Secondary | ICD-10-CM | POA: Diagnosis not present

## 2019-02-24 DIAGNOSIS — K219 Gastro-esophageal reflux disease without esophagitis: Secondary | ICD-10-CM | POA: Diagnosis not present

## 2019-02-24 DIAGNOSIS — Z6837 Body mass index (BMI) 37.0-37.9, adult: Secondary | ICD-10-CM | POA: Diagnosis not present

## 2019-02-24 DIAGNOSIS — N3281 Overactive bladder: Secondary | ICD-10-CM | POA: Diagnosis not present

## 2019-02-24 DIAGNOSIS — Z9181 History of falling: Secondary | ICD-10-CM | POA: Diagnosis not present

## 2019-02-24 DIAGNOSIS — R69 Illness, unspecified: Secondary | ICD-10-CM | POA: Diagnosis not present

## 2019-02-26 DIAGNOSIS — K219 Gastro-esophageal reflux disease without esophagitis: Secondary | ICD-10-CM | POA: Diagnosis not present

## 2019-02-26 DIAGNOSIS — Z9181 History of falling: Secondary | ICD-10-CM | POA: Diagnosis not present

## 2019-02-26 DIAGNOSIS — R69 Illness, unspecified: Secondary | ICD-10-CM | POA: Diagnosis not present

## 2019-02-26 DIAGNOSIS — R2681 Unsteadiness on feet: Secondary | ICD-10-CM | POA: Diagnosis not present

## 2019-02-26 DIAGNOSIS — I10 Essential (primary) hypertension: Secondary | ICD-10-CM | POA: Diagnosis not present

## 2019-02-26 DIAGNOSIS — M810 Age-related osteoporosis without current pathological fracture: Secondary | ICD-10-CM | POA: Diagnosis not present

## 2019-02-26 DIAGNOSIS — N3281 Overactive bladder: Secondary | ICD-10-CM | POA: Diagnosis not present

## 2019-02-26 DIAGNOSIS — Z6837 Body mass index (BMI) 37.0-37.9, adult: Secondary | ICD-10-CM | POA: Diagnosis not present

## 2019-02-26 DIAGNOSIS — E782 Mixed hyperlipidemia: Secondary | ICD-10-CM | POA: Diagnosis not present

## 2019-03-19 ENCOUNTER — Telehealth: Payer: Self-pay | Admitting: Nurse Practitioner

## 2019-03-30 ENCOUNTER — Telehealth: Payer: Self-pay | Admitting: Nurse Practitioner

## 2019-04-05 DIAGNOSIS — R42 Dizziness and giddiness: Secondary | ICD-10-CM | POA: Diagnosis not present

## 2019-04-05 DIAGNOSIS — R52 Pain, unspecified: Secondary | ICD-10-CM | POA: Diagnosis not present

## 2019-04-05 DIAGNOSIS — W19XXXA Unspecified fall, initial encounter: Secondary | ICD-10-CM | POA: Diagnosis not present

## 2019-04-06 ENCOUNTER — Encounter: Payer: Self-pay | Admitting: Nurse Practitioner

## 2019-04-06 ENCOUNTER — Other Ambulatory Visit: Payer: Self-pay

## 2019-04-06 ENCOUNTER — Ambulatory Visit (INDEPENDENT_AMBULATORY_CARE_PROVIDER_SITE_OTHER): Payer: Medicare HMO | Admitting: Nurse Practitioner

## 2019-04-06 VITALS — BP 147/92 | HR 78 | Temp 99.1°F | Resp 20

## 2019-04-06 DIAGNOSIS — M810 Age-related osteoporosis without current pathological fracture: Secondary | ICD-10-CM

## 2019-04-06 DIAGNOSIS — N3000 Acute cystitis without hematuria: Secondary | ICD-10-CM | POA: Diagnosis not present

## 2019-04-06 DIAGNOSIS — F339 Major depressive disorder, recurrent, unspecified: Secondary | ICD-10-CM

## 2019-04-06 DIAGNOSIS — K219 Gastro-esophageal reflux disease without esophagitis: Secondary | ICD-10-CM | POA: Diagnosis not present

## 2019-04-06 DIAGNOSIS — N3281 Overactive bladder: Secondary | ICD-10-CM

## 2019-04-06 DIAGNOSIS — E782 Mixed hyperlipidemia: Secondary | ICD-10-CM | POA: Diagnosis not present

## 2019-04-06 DIAGNOSIS — I10 Essential (primary) hypertension: Secondary | ICD-10-CM | POA: Diagnosis not present

## 2019-04-06 DIAGNOSIS — R69 Illness, unspecified: Secondary | ICD-10-CM | POA: Diagnosis not present

## 2019-04-06 DIAGNOSIS — W19XXXA Unspecified fall, initial encounter: Secondary | ICD-10-CM

## 2019-04-06 LAB — MICROSCOPIC EXAMINATION
Epithelial Cells (non renal): >10 /hpf — AB (ref 0–10)
Renal Epithel, UA: NONE SEEN /hpf

## 2019-04-06 LAB — URINALYSIS, COMPLETE
Bilirubin, UA: NEGATIVE
Glucose, UA: NEGATIVE
Leukocytes,UA: NEGATIVE
Nitrite, UA: POSITIVE — AB
Protein,UA: NEGATIVE
Specific Gravity, UA: >1.03 — ABNORMAL HIGH (ref 1.005–1.030)
Urobilinogen, Ur: 1 mg/dL (ref 0.2–1.0)
pH, UA: 6 (ref 5.0–7.5)

## 2019-04-06 MED ORDER — OMEPRAZOLE 40 MG PO CPDR: 40.0000 mg | DELAYED_RELEASE_CAPSULE | Freq: Every day | ORAL | 1 refills | Status: DC

## 2019-04-06 MED ORDER — DARIFENACIN HYDROBROMIDE ER 7.5 MG PO TB24: 7.5000 mg | ORAL_TABLET | Freq: Every day | ORAL | 1 refills | Status: DC

## 2019-04-06 MED ORDER — RISEDRONATE SODIUM 35 MG PO TABS: 35.0000 mg | ORAL_TABLET | ORAL | 1 refills | Status: DC

## 2019-04-06 MED ORDER — SERTRALINE HCL 25 MG PO TABS: 12.5000 mg | ORAL_TABLET | Freq: Every day | ORAL | 1 refills | Status: DC | PRN

## 2019-04-06 MED ORDER — LISINOPRIL 40 MG PO TABS: 40.0000 mg | ORAL_TABLET | Freq: Every day | ORAL | 1 refills | Status: DC

## 2019-04-06 MED ORDER — MIRABEGRON ER 50 MG PO TB24: 50.0000 mg | ORAL_TABLET | Freq: Every day | ORAL | 1 refills | Status: DC

## 2019-04-06 MED ORDER — ATORVASTATIN CALCIUM 10 MG PO TABS: 10.0000 mg | ORAL_TABLET | Freq: Every day | ORAL | 1 refills | Status: DC

## 2019-04-06 NOTE — Patient Instructions (Signed)

## 2019-04-06 NOTE — Addendum Note (Signed)
Addended by: Chevis Pretty on: 04/06/2019 03:04 PM   Modules accepted: Orders

## 2019-04-06 NOTE — Progress Notes (Addendum)
Subjective:    Patient ID: Tracie Wiggins, female    DOB: 11/20/1945, 74 y.o.   MRN: [id number]   Chief Complaint: Medical Management of Chronic Issues    HPI:  1. Essential hypertension No c/o chest pain, sob or headache. Does not check blood pressure at home. BP Readings from Last 3 Encounters:  01/20/19 (!) 167/92  07/03/18 111/81  06/02/18 (!) 138/95     2. Mixed hyperlipidemia Does not watch diet and does very little exercise Lab Results  Component Value Date   CHOL 162 07/03/2018   HDL 67 07/03/2018   LDLCALC 75 07/03/2018   TRIG 99 07/03/2018   CHOLHDL 2.4 07/03/2018     3. Gastroesophageal reflux disease without esophagitis Is on omeprazole daily and is doing well.  4. OAB (overactive bladder) Is on myrbetriq and enablex. She voids a lot but is better with her meds  5. Age-related osteoporosis without current pathological fracture Last dexascan was done 05/06/17 with t score of -2.7. Is on actonel without any side effects. She has been on actonel for several years, but unsure how long.  6. Recurrent major depressive disorder, remission status unspecified ([place]) Is on zoloft daily. Denies any side effects. Depression screen [facility] 2/9 04/06/2019 01/20/2019 01/05/2019  Decreased Interest 0 0 0  Down, Depressed, Hopeless 0 0 0  PHQ - 2 Score 0 0 0     7. Morbid obesity ([place]) No recent weight changes Wt Readings from Last 3 Encounters:  01/20/19 206 lb (93.4 kg)  07/03/18 213 lb (96.6 kg)  06/02/18 221 lb (100.2 kg)   BMI Readings from Last 3 Encounters:  01/20/19 40.23 kg/m  07/03/18 41.60 kg/m  06/02/18 43.16 kg/m       Outpatient Encounter Medications as of 04/06/2019  Medication Sig  . atorvastatin (LIPITOR) 10 MG tablet Take 1 tablet (10 mg total) by mouth daily.  . Cholecalciferol (VITAMIN D) 2000 units CAPS Take 1 capsule by mouth daily.  [name] darifenacin (ENABLEX) 7.5 MG 24 hr tablet Take 1 tablet (7.5 mg total) by mouth daily.  . Incontinence  Supply Disposable (DEPEND SILHOUETTE BRIEFS L/XL) MISC 1 each by Does not apply route 3 (three) times daily.  . Incontinence Supply Disposable (POISE HOURGLASS SHAPE) PADS 1 each by Does not apply route 4 (four) times daily - after meals and at bedtime.  [name] lisinopril (ZESTRIL) 40 MG tablet Take 1 tablet (40 mg total) by mouth daily.  . mirabegron ER (MYRBETRIQ) 50 MG TB24 tablet Take 1 tablet (50 mg total) by mouth daily.  . Omega-3 Fatty Acids (FISH OIL) 1000 MG CAPS Take 1 capsule (1,000 mg total) by mouth daily.  [name] omeprazole (PRILOSEC) 40 MG capsule Take 1 capsule (40 mg total) by mouth daily.  . risedronate (ACTONEL) 35 MG tablet Take 1 tablet (35 mg total) by mouth every 7 (seven) days. with water on empty stomach, nothing by mouth or lie down for next 30 minutes.  . sertraline (ZOLOFT) 25 MG tablet Take 0.5 tablets (12.5 mg total) by mouth daily as needed (for anxiety). Only takes 0.5 tablet     Past Surgical History:  Procedure Laterality Date  . ABDOMINAL HYSTERECTOMY    . APPENDECTOMY    . BIOPSY BREAST Right    Fluid filled cyst  . CATARACT EXTRACTION W/PHACO Left 07/20/2016   Procedure: CATARACT EXTRACTION PHACO AND INTRAOCULAR LENS PLACEMENT (IOC);  Surgeon: Baruch Goldmann, MD;  Location: [facility];  Service: Ophthalmology;  Laterality: Left;  CDE:  3.19  . CATARACT EXTRACTION W/PHACO Right 09/07/2016   Procedure: CATARACT EXTRACTION PHACO AND INTRAOCULAR LENS PLACEMENT (IOC);  Surgeon: Baruch Goldmann, MD;  Location: [facility];  Service: Ophthalmology;  Laterality: Right;  CDE: 5.04  . CHOLECYSTECTOMY    . NASAL SINUS SURGERY      Family History  Problem Relation Age of Onset  . COPD Mother   . Stroke Mother   . Heart disease Father   . Heart attack Father   . Heart disease Sister   . Diabetes Sister   . Heart attack Sister   . Stroke Sister   . Healthy Daughter     New complaints: She has fallen several times in the last several months. She has never hurt herself. She says  her legs "speeds UP" causing her to fall. She uses a cane but refuses to use a walker. She has had PT at home but insurance will no longer pay for it. They want her to have in house treatment.  Social history: Lives by herself. She has family that check on her several days a week. She has a medical alert system but has never had to use it.  Controlled substance contract: N/A    Review of Systems  Constitutional: Negative for diaphoresis.  Eyes: Negative for pain.  Respiratory: Negative for shortness of breath.   Cardiovascular: Negative for chest pain, palpitations and leg swelling.  Gastrointestinal: Negative for abdominal pain.  Endocrine: Negative for polydipsia.  Skin: Negative for rash.  Neurological: Negative for dizziness, weakness and headaches.  Hematological: Does not bruise/bleed easily.  All other systems reviewed and are negative.      Objective:   Physical Exam Vitals and nursing note reviewed.  Constitutional:      General: She is not in acute distress.    Appearance: Normal appearance. She is well-developed.  HENT:     Head: Normocephalic.     Nose: Nose normal.  Eyes:     Pupils: Pupils are equal, round, and reactive to light.  Neck:     Vascular: No carotid bruit or JVD.  Cardiovascular:     Rate and Rhythm: Normal rate and regular rhythm.     Heart sounds: Normal heart sounds.  Pulmonary:     Effort: Pulmonary effort is normal. No respiratory distress.     Breath sounds: Normal breath sounds. No wheezing or rales.  Chest:     Chest wall: No tenderness.  Abdominal:     General: Bowel sounds are normal. There is no distension or abdominal bruit.     Palpations: Abdomen is soft. There is no hepatomegaly, splenomegaly, mass or pulsatile mass.     Tenderness: There is no abdominal tenderness.  Musculoskeletal:        General: Normal range of motion.     Cervical back: Normal range of motion and neck supple.     Comments: Sitting in wheel chair Unable  to get on exam table.  Lymphadenopathy:     Cervical: No cervical adenopathy.  Skin:    General: Skin is warm and dry.  Neurological:     Mental Status: She is alert and oriented to person, place, and time.     Deep Tendon Reflexes: Reflexes are normal and symmetric.  Psychiatric:        Behavior: Behavior normal.        Thought Content: Thought content normal.        Judgment: Judgment normal.    BP (!) 147/92  Pulse 78   Temp 99.1 F (37.3 C) (Temporal)   Resp 20   SpO2 95%        Assessment & Plan:  Tracie Wiggins comes in today with chief complaint of Medical Management of Chronic Issues   Diagnosis and orders addressed:  1. Essential hypertension Low sodium diet - lisinopril (ZESTRIL) 40 MG tablet; Take 1 tablet (40 mg total) by mouth daily.  Dispense: 90 tablet; Refill: 1 - CBC with Differential/Platelet - CMP14+EGFR  2. Mixed hyperlipidemia Low fat diet - atorvastatin (LIPITOR) 10 MG tablet; Take 1 tablet (10 mg total) by mouth daily.  Dispense: 90 tablet; Refill: 1 - Lipid panel  3. Gastroesophageal reflux disease without esophagitis Avoid spicy foods Do not eat 2 hours prior to bedtime - omeprazole (PRILOSEC) 40 MG capsule; Take 1 capsule (40 mg total) by mouth daily.  Dispense: 90 capsule; Refill: 1  4. OAB (overactive bladder) Continue meds as rx - darifenacin (ENABLEX) 7.5 MG 24 hr tablet; Take 1 tablet (7.5 mg total) by mouth daily.  Dispense: 90 tablet; Refill: 1 - mirabegron ER (MYRBETRIQ) 50 MG TB24 tablet; Take 1 tablet (50 mg total) by mouth daily.  Dispense: 90 tablet; Refill: 1  5. Age-related osteoporosis without current pathological fracture Unable to do weight bearing exercises due to strength - risedronate (ACTONEL) 35 MG tablet; Take 1 tablet (35 mg total) by mouth every 7 (seven) days. with water on empty stomach, nothing by mouth or lie down for next 30 minutes.  Dispense: 90 tablet; Refill: 1  6. Recurrent major depressive  disorder, remission status unspecified (HCC) Stress management - sertraline (ZOLOFT) 25 MG tablet; Take 0.5 tablets (12.5 mg total) by mouth daily as needed (for anxiety). Only takes 0.5 tablet  Dispense: 90 tablet; Refill: 1  7. Morbid obesity ([place]) Discussed diet and exercise for person with BMI >25 Will recheck weight in 3-6 months  8. Falls Family will try to get her in somewhere for rehab    Labs pending Health Maintenance reviewed Diet and exercise encouraged  Follow up plan: 6 month   Mary-Margaret Hassell Done, FNP  addenedum  UTI- cipro 500- 1 po BID #10 no refills Culture ordered    addenedum Urine grew e coli- resistant to cipro Changed to macrobid bi for 10 days- prescription called in and family aware  Mary-Margaret Hassell Done, FNP

## 2019-04-06 NOTE — Addendum Note (Signed)
Addended by: Chevis Pretty on: 04/06/2019 04:47 PM   Modules accepted: Orders

## 2019-04-07 LAB — CBC WITH DIFFERENTIAL/PLATELET
Basophils Absolute: 0.1 10*3/uL (ref 0.0–0.2)
Basos: 1 %
EOS (ABSOLUTE): 0.1 10*3/uL (ref 0.0–0.4)
Eos: 1 %
Hematocrit: 40.9 % (ref 34.0–46.6)
Hemoglobin: 13.8 g/dL (ref 11.1–15.9)
Immature Grans (Abs): 0 10*3/uL (ref 0.0–0.1)
Immature Granulocytes: 0 %
Lymphocytes Absolute: 2.2 10*3/uL (ref 0.7–3.1)
Lymphs: 21 %
MCH: 31.9 pg (ref 26.6–33.0)
MCHC: 33.7 g/dL (ref 31.5–35.7)
MCV: 95 fL (ref 79–97)
Monocytes Absolute: 0.7 10*3/uL (ref 0.1–0.9)
Monocytes: 6 %
Neutrophils Absolute: 7.6 10*3/uL — ABNORMAL HIGH (ref 1.4–7.0)
Neutrophils: 71 %
Platelets: 333 10*3/uL (ref 150–450)
RBC: 4.32 x10E6/uL (ref 3.77–5.28)
RDW: 13.4 % (ref 11.7–15.4)
WBC: 10.7 10*3/uL (ref 3.4–10.8)

## 2019-04-07 LAB — LIPID PANEL
Chol/HDL Ratio: 2.2 ratio (ref 0.0–4.4)
Cholesterol, Total: 183 mg/dL (ref 100–199)
HDL: 82 mg/dL (ref >39)
LDL Chol Calc (NIH): 86 mg/dL (ref 0–99)
Triglycerides: 84 mg/dL (ref 0–149)
VLDL Cholesterol Cal: 15 mg/dL (ref 5–40)

## 2019-04-07 LAB — CMP14+EGFR
ALT: 15 IU/L (ref 0–32)
AST: 19 IU/L (ref 0–40)
Albumin/Globulin Ratio: 2 (ref 1.2–2.2)
Albumin: 4.4 g/dL (ref 3.7–4.7)
Alkaline Phosphatase: 119 IU/L — ABNORMAL HIGH (ref 39–117)
BUN/Creatinine Ratio: 24 (ref 12–28)
BUN: 18 mg/dL (ref 8–27)
Bilirubin Total: 0.5 mg/dL (ref 0.0–1.2)
CO2: 26 mmol/L (ref 20–29)
Calcium: 9.6 mg/dL (ref 8.7–10.3)
Chloride: 105 mmol/L (ref 96–106)
Creatinine, Ser: 0.76 mg/dL (ref 0.57–1.00)
GFR calc Af Amer: 89 mL/min/{1.73_m2} (ref >59)
GFR calc non Af Amer: 77 mL/min/{1.73_m2} (ref >59)
Globulin, Total: 2.2 g/dL (ref 1.5–4.5)
Glucose: 97 mg/dL (ref 65–99)
Potassium: 4.2 mmol/L (ref 3.5–5.2)
Sodium: 145 mmol/L — ABNORMAL HIGH (ref 134–144)
Total Protein: 6.6 g/dL (ref 6.0–8.5)

## 2019-04-08 ENCOUNTER — Telehealth: Payer: Self-pay | Admitting: Nurse Practitioner

## 2019-04-08 NOTE — Telephone Encounter (Signed)
Please review lab result and advise  

## 2019-04-08 NOTE — Telephone Encounter (Signed)
Patient says she has not been taking either medication.  This would be a new medicine for her. Please let nurse know which script to cancel. Thanks

## 2019-04-08 NOTE — Telephone Encounter (Signed)
Patient aware.

## 2019-04-08 NOTE — Telephone Encounter (Signed)
Please advise on antibiotic for bladder infection?

## 2019-04-08 NOTE — Telephone Encounter (Signed)
Patient said she was taking both. Call her and ask her about it

## 2019-04-08 NOTE — Telephone Encounter (Signed)
Cancel myrbetriq- fill enablex

## 2019-04-08 NOTE — Telephone Encounter (Signed)
Two bladder medications?  Pharmacy wants to know if this is correct?

## 2019-04-08 NOTE — Telephone Encounter (Signed)
Will need to be seen

## 2019-04-09 ENCOUNTER — Other Ambulatory Visit: Payer: Self-pay | Admitting: Nurse Practitioner

## 2019-04-09 LAB — URINE CULTURE

## 2019-04-09 MED ORDER — CIPROFLOXACIN HCL 500 MG PO TABS: 500.0000 mg | ORAL_TABLET | Freq: Two times a day (BID) | ORAL | 0 refills | Status: DC

## 2019-04-09 NOTE — Telephone Encounter (Signed)
This was already taken care of earlier.

## 2019-04-09 NOTE — Telephone Encounter (Signed)
Aware. 

## 2019-04-12 MED ORDER — NITROFURANTOIN MONOHYD MACRO 100 MG PO CAPS: 100.0000 mg | ORAL_CAPSULE | Freq: Two times a day (BID) | ORAL | 0 refills | Status: DC

## 2019-04-12 NOTE — Addendum Note (Signed)
Addended by: Chevis Pretty on: 04/12/2019 01:14 PM   Modules accepted: Orders

## 2019-04-29 ENCOUNTER — Other Ambulatory Visit: Payer: Self-pay

## 2019-04-29 ENCOUNTER — Emergency Department (HOSPITAL_COMMUNITY): Payer: Medicare HMO

## 2019-04-29 ENCOUNTER — Emergency Department (HOSPITAL_COMMUNITY)
Admission: EM | Admit: 2019-04-29 | Discharge: 2019-04-29 | Disposition: A | Discharge status: Home / Self Care | Payer: Medicare HMO | Attending: Emergency Medicine | Admitting: Emergency Medicine

## 2019-04-29 DIAGNOSIS — I10 Essential (primary) hypertension: Secondary | ICD-10-CM | POA: Insufficient documentation

## 2019-04-29 DIAGNOSIS — R531 Weakness: Secondary | ICD-10-CM

## 2019-04-29 DIAGNOSIS — R52 Pain, unspecified: Secondary | ICD-10-CM | POA: Diagnosis not present

## 2019-04-29 DIAGNOSIS — Z79899 Other long term (current) drug therapy: Secondary | ICD-10-CM | POA: Diagnosis not present

## 2019-04-29 DIAGNOSIS — R0902 Hypoxemia: Secondary | ICD-10-CM | POA: Diagnosis not present

## 2019-04-29 DIAGNOSIS — K5900 Constipation, unspecified: Secondary | ICD-10-CM | POA: Diagnosis not present

## 2019-04-29 DIAGNOSIS — I517 Cardiomegaly: Secondary | ICD-10-CM | POA: Diagnosis not present

## 2019-04-29 DIAGNOSIS — R110 Nausea: Secondary | ICD-10-CM | POA: Diagnosis not present

## 2019-04-29 DIAGNOSIS — I959 Hypotension, unspecified: Secondary | ICD-10-CM | POA: Diagnosis not present

## 2019-04-29 LAB — CBC WITH DIFFERENTIAL/PLATELET
Abs Immature Granulocytes: 0.03 10*3/uL (ref 0.00–0.07)
Basophils Absolute: 0.1 10*3/uL (ref 0.0–0.1)
Basophils Relative: 1 %
Eosinophils Absolute: 0.2 10*3/uL (ref 0.0–0.5)
Eosinophils Relative: 2 %
HCT: 43.2 % (ref 36.0–46.0)
Hemoglobin: 13.6 g/dL (ref 12.0–15.0)
Immature Granulocytes: 0 %
Lymphocytes Relative: 18 %
Lymphs Abs: 1.8 10*3/uL (ref 0.7–4.0)
MCH: 30.7 pg (ref 26.0–34.0)
MCHC: 31.5 g/dL (ref 30.0–36.0)
MCV: 97.5 fL (ref 80.0–100.0)
Monocytes Absolute: 0.5 10*3/uL (ref 0.1–1.0)
Monocytes Relative: 5 %
Neutro Abs: 7.7 10*3/uL (ref 1.7–7.7)
Neutrophils Relative %: 74 %
Platelets: 297 10*3/uL (ref 150–400)
RBC: 4.43 MIL/uL (ref 3.87–5.11)
RDW: 13.4 % (ref 11.5–15.5)
WBC: 10.2 10*3/uL (ref 4.0–10.5)
nRBC: 0 % (ref 0.0–0.2)

## 2019-04-29 LAB — COMPREHENSIVE METABOLIC PANEL
ALT: 19 U/L (ref 0–44)
AST: 20 U/L (ref 15–41)
Albumin: 3.8 g/dL (ref 3.5–5.0)
Alkaline Phosphatase: 112 U/L (ref 38–126)
Anion gap: 10 (ref 5–15)
BUN: 17 mg/dL (ref 8–23)
CO2: 26 mmol/L (ref 22–32)
Calcium: 9.2 mg/dL (ref 8.9–10.3)
Chloride: 101 mmol/L (ref 98–111)
Creatinine, Ser: 0.94 mg/dL (ref 0.44–1.00)
GFR calc Af Amer: >60 mL/min (ref >60)
GFR calc non Af Amer: 59 mL/min — ABNORMAL LOW (ref >60)
Glucose, Bld: 119 mg/dL — ABNORMAL HIGH (ref 70–99)
Potassium: 3.8 mmol/L (ref 3.5–5.1)
Sodium: 137 mmol/L (ref 135–145)
Total Bilirubin: 1 mg/dL (ref 0.3–1.2)
Total Protein: 6.9 g/dL (ref 6.5–8.1)

## 2019-04-29 LAB — URINALYSIS, ROUTINE W REFLEX MICROSCOPIC
Bilirubin Urine: NEGATIVE
Glucose, UA: NEGATIVE mg/dL
Hgb urine dipstick: NEGATIVE
Ketones, ur: NEGATIVE mg/dL
Leukocytes,Ua: NEGATIVE
Nitrite: NEGATIVE
Protein, ur: NEGATIVE mg/dL
Specific Gravity, Urine: 1.011 (ref 1.005–1.030)
pH: 7 (ref 5.0–8.0)

## 2019-04-29 LAB — ETHANOL: Alcohol, Ethyl (B): <10 mg/dL (ref <10)

## 2019-04-29 LAB — LACTIC ACID, PLASMA
Lactic Acid, Venous: 1.9 mmol/L (ref 0.5–1.9)
Lactic Acid, Venous: 1.5 mmol/L (ref 0.5–1.9)

## 2019-04-29 MED ORDER — SODIUM CHLORIDE 0.9 % IV BOLUS
500.0000 mL | Freq: Once | INTRAVENOUS | Status: AC
  Administered 2019-04-29: 18:00:00 500 mL via INTRAVENOUS

## 2019-04-29 NOTE — Discharge Instructions (Addendum)
As discussed, your evaluation today has been largely reassuring.  But, it is important that you monitor your condition carefully, and do not hesitate to return to the ED if you develop new, or concerning changes in your condition. ? ?Otherwise, please follow-up with your physician for appropriate ongoing care. ? ?

## 2019-04-29 NOTE — ED Triage Notes (Signed)
EMS reports they were called out for a fall earlier.  Reports police got there and said pt was sleeping.  A little later, ems was called again because aid says pt has generalized weakness, n/v today.  Reports finished antibiotics for UTI today.  Pt alert, denies any pain.

## 2019-04-29 NOTE — ED Provider Notes (Signed)
[facility] EMERGENCY DEPARTMENT Provider Note   CSN: [id number] Arrival date & time: 04/29/19  1733     History Chief Complaint  Patient presents with  . Weakness    Tracie Wiggins is a 74 y.o. female.  HPI    Patient presents via EMS with concern of weakness.  When the patient is awake, alert, oriented.  She notes that she became weak, possibly more than a few days ago, but worse over the past day or so.  Weakness is progressive enough that she was unable to extricate herself from her couch. Patient has no focal pain, no focal weakness.  It seems as though there is no fever as well. EMS providers report that the patient was awake, alert, hemodynamically unremarkable in route, but complaining of weakness as well.  Past Medical History:  Diagnosis Date  . Allergy   . Anxiety   . Arthritis   . Cataract   . Depression   . GERD (gastroesophageal reflux disease)   . Headache   . Hyperlipidemia   . Hypertension   . Osteoporosis   . Shingles    twice    Patient Active Problem List   Diagnosis Date Noted  . Fall 04/06/2019  . Morbid obesity ([place]) 10/04/2014  . OAB (overactive bladder) 04/07/2014  . Hypertension 12/22/2012  . Hyperlipidemia 06/13/2012  . GERD (gastroesophageal reflux disease) 06/13/2012  . Osteoporosis 06/13/2012  . Depression 06/13/2012    Past Surgical History:  Procedure Laterality Date  . ABDOMINAL HYSTERECTOMY    . APPENDECTOMY    . BIOPSY BREAST Right    Fluid filled cyst  . CATARACT EXTRACTION W/PHACO Left 07/20/2016   Procedure: CATARACT EXTRACTION PHACO AND INTRAOCULAR LENS PLACEMENT (IOC);  Surgeon: Baruch Goldmann, MD;  Location: [facility];  Service: Ophthalmology;  Laterality: Left;  CDE: 3.19  . CATARACT EXTRACTION W/PHACO Right 09/07/2016   Procedure: CATARACT EXTRACTION PHACO AND INTRAOCULAR LENS PLACEMENT (IOC);  Surgeon: Baruch Goldmann, MD;  Location: [facility];  Service: Ophthalmology;  Laterality: Right;  CDE: 5.04  . CHOLECYSTECTOMY     . NASAL SINUS SURGERY       OB History   No obstetric history on file.     Family History  Problem Relation Age of Onset  . COPD Mother   . Stroke Mother   . Heart disease Father   . Heart attack Father   . Heart disease Sister   . Diabetes Sister   . Heart attack Sister   . Stroke Sister   . Healthy Daughter     Social History   Tobacco Use  . Smoking status: Never Smoker  . Smokeless tobacco: Never Used  Substance Use Topics  . Alcohol use: No  . Drug use: No    Home Medications Prior to Admission medications   Medication Sig Start Date End Date Taking? Authorizing Provider  atorvastatin (LIPITOR) 10 MG tablet Take 1 tablet (10 mg total) by mouth daily. 04/06/19  Yes Martin, Mary-Margaret, FNP  Cholecalciferol (VITAMIN D) 2000 units CAPS Take 1 capsule by mouth daily.   Yes [provider]  darifenacin (ENABLEX) 7.5 MG 24 hr tablet Take 1 tablet (7.5 mg total) by mouth daily. 04/06/19  Yes Martin, Mary-Margaret, FNP  lisinopril (ZESTRIL) 40 MG tablet Take 1 tablet (40 mg total) by mouth daily. 04/06/19  Yes Martin, Mary-Margaret, FNP  nitrofurantoin, macrocrystal-monohydrate, (MACROBID) 100 MG capsule Take 1 capsule (100 mg total) by mouth 2 (two) times daily. 1 po BId Patient taking  differently: Take 100 mg by mouth 2 (two) times daily. 10 day course prescribed on 04/23/2019 04/12/19  Yes Hassell Done, Mary-Margaret, FNP  omeprazole (PRILOSEC) 40 MG capsule Take 1 capsule (40 mg total) by mouth daily. 04/06/19  Yes Hassell Done, Mary-Margaret, FNP  risedronate (ACTONEL) 35 MG tablet Take 1 tablet (35 mg total) by mouth every 7 (seven) days. with water on empty stomach, nothing by mouth or lie down for next 30 minutes. 04/06/19  Yes Martin, Mary-Margaret, FNP  sertraline (ZOLOFT) 25 MG tablet Take 0.5 tablets (12.5 mg total) by mouth daily as needed (for anxiety). Only takes 0.5 tablet 04/06/19  Yes Martin, Mary-Margaret, FNP  ciprofloxacin (CIPRO) 500 MG tablet Take 1 tablet (500 mg  total) by mouth 2 (two) times daily. Patient not taking: Reported on 04/29/2019 04/09/19   Chevis Pretty, FNP  Omega-3 Fatty Acids (FISH OIL) 1000 MG CAPS Take 1 capsule (1,000 mg total) by mouth daily. 10/08/17   Chevis Pretty, FNP    Allergies    Asa [aspirin], Codeine, Erythromycin, Penicillins, and Niacin and related  Review of Systems   Review of Systems  Constitutional:       Per HPI, otherwise negative  HENT:       Per HPI, otherwise negative  Respiratory:       Per HPI, otherwise negative  Cardiovascular:       Per HPI, otherwise negative  Gastrointestinal: Negative for vomiting.  Endocrine:       Negative aside from HPI  Genitourinary:       Neg aside from HPI   Musculoskeletal:       Per HPI, otherwise negative  Skin: Negative.   Neurological: Positive for weakness. Negative for syncope.    Physical Exam Updated Vital Signs BP (!) 152/80   Pulse 61   Temp 97.9 F (36.6 C) (Oral)   Resp 16   Ht 5\' 2"  (1.575 m)   Wt 81.6 kg   SpO2 95%   BMI 32.92 kg/m   Physical Exam Vitals and nursing note reviewed.  Constitutional:      General: She is not in acute distress.    Appearance: She is well-developed.  HENT:     Head: Normocephalic and atraumatic.  Eyes:     Conjunctiva/sclera: Conjunctivae normal.  Cardiovascular:     Rate and Rhythm: Normal rate and regular rhythm.  Pulmonary:     Effort: Pulmonary effort is normal. No respiratory distress.     Breath sounds: Normal breath sounds. No stridor.  Abdominal:     General: There is no distension.  Skin:    General: Skin is warm and dry.  Neurological:     Mental Status: She is alert and oriented to person, place, and time.     Cranial Nerves: No cranial nerve deficit.     ED Results / Procedures / Treatments   Labs (all labs ordered are listed, but only abnormal results are displayed) Labs Reviewed  COMPREHENSIVE METABOLIC PANEL - Abnormal; Notable for the following components:       Result Value   Glucose, Bld 119 (*)    GFR calc non Af Amer 59 (*)    All other components within normal limits  URINE CULTURE  ETHANOL  LACTIC ACID, PLASMA  LACTIC ACID, PLASMA  CBC WITH DIFFERENTIAL/PLATELET  URINALYSIS, ROUTINE W REFLEX MICROSCOPIC    EKG EKG Interpretation  Date/Time:  Wednesday April 29 2019 17:37:30 EDT Ventricular Rate:  69 PR Interval:    QRS Duration: 99 QT  Interval:  424 QTC Calculation: 455 R Axis:   49 Text Interpretation: Sinus rhythm Abnormal R-wave progression, early transition Probable inferior infarct, old Abnormal ECG Confirmed by Carmin Muskrat [phone]) on 04/29/2019 5:45:31 PM   Radiology DG Chest Port 1 View  Result Date: 04/29/2019 CLINICAL DATA:  Weakness with nausea and vomiting EXAM: PORTABLE CHEST 1 VIEW COMPARISON:  05/04/2018 FINDINGS: Cardiac shadow is enlarged. Lungs are well aerated bilaterally. No focal infiltrate or sizable effusion is seen. No bony abnormality is noted. IMPRESSION: Stable cardiomegaly.  No acute abnormality noted. Electronically Signed   By: Inez Catalina M.D.   On: 04/29/2019 18:57    Procedures Procedures (including critical care time)  Medications Ordered in ED Medications  sodium chloride 0.9 % bolus 500 mL (0 mLs Intravenous Stopped 04/29/19 2112)    ED Course  I have reviewed the triage vital signs and the nursing notes.  Pertinent labs & imaging results that were available during my care of the patient were reviewed by me and considered in my medical decision making (see chart for details).  Update: Patient states that she feels back to normal, has no ongoing weakness, no other complaints.  Initial labs are reassuring, no leukocytosis, no lactic acidosis.  We are awaiting urinalysis.   10:27 PM Urinalysis unremarkable.  Patient remains hemodynamically unremarkable.  This adult female presents with concern of weakness. Patient has multiple medical issues, including recent cessation of antibiotics  for treatment of urinary tract infection. Here patient is awake, alert, speaking clearly, she is hemodynamically unremarkable, labs unremarkable, no evidence for acute new infection, bacteremia, sepsis, no distress, no increased work of breathing suggesting other acute new pathology. Urine culture was sent in addition to labs, but given the reassuring findings, her denial of any ongoing complaints, she was discharged in stable condition.   Final Clinical Impression(s) / ED Diagnoses Final diagnoses:  Weakness     Carmin Muskrat, MD 04/29/19 2228

## 2019-04-30 ENCOUNTER — Telehealth: Payer: Self-pay | Admitting: Nurse Practitioner

## 2019-04-30 DIAGNOSIS — N3000 Acute cystitis without hematuria: Secondary | ICD-10-CM

## 2019-04-30 MED ORDER — DOXYCYCLINE HYCLATE 100 MG PO TABS: 100.0000 mg | ORAL_TABLET | Freq: Two times a day (BID) | ORAL | 0 refills | Status: DC

## 2019-04-30 NOTE — Telephone Encounter (Signed)
Sent in doxycycline rx- waiting on urine culture and will see if need to change meds at that time. Did not want to do cirpo because she just had that earlier this month. Prescription was sent to pharmacy.
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
## 2019-04-30 NOTE — Addendum Note (Signed)
Addended by: Chevis Pretty on: 04/30/2019 03:33 PM   Modules accepted: Orders

## 2019-04-30 NOTE — Telephone Encounter (Signed)
Can you view hospital labs so we can go over with patient

## 2019-04-30 NOTE — Telephone Encounter (Signed)
All labs are normal except she had UTI- did they treat that?

## 2019-04-30 NOTE — Telephone Encounter (Signed)
Lmtcb.

## 2019-05-01 LAB — URINE CULTURE

## 2019-05-01 NOTE — Telephone Encounter (Signed)
Daughter aware and verbalizes understanding - states she would like for MMM to call her.  She is very concerned about her mom.   [phone]

## 2019-05-01 NOTE — Telephone Encounter (Signed)
Spoke with daughter- reveiwed urine culture from hospitla. Family asked to bring in new specimenfor culture.

## 2019-05-04 ENCOUNTER — Other Ambulatory Visit: Payer: Medicare HMO

## 2019-05-04 ENCOUNTER — Other Ambulatory Visit: Payer: Self-pay

## 2019-05-04 DIAGNOSIS — N3000 Acute cystitis without hematuria: Secondary | ICD-10-CM | POA: Diagnosis not present

## 2019-05-04 LAB — URINALYSIS, COMPLETE
Bilirubin, UA: NEGATIVE
Glucose, UA: NEGATIVE
Leukocytes,UA: NEGATIVE
Nitrite, UA: NEGATIVE
Protein,UA: NEGATIVE
RBC, UA: NEGATIVE
Specific Gravity, UA: 1.02 (ref 1.005–1.030)
Urobilinogen, Ur: 0.2 mg/dL (ref 0.2–1.0)
pH, UA: 5.5 (ref 5.0–7.5)

## 2019-05-04 LAB — MICROSCOPIC EXAMINATION
RBC, Urine: NONE SEEN /hpf (ref 0–2)
Renal Epithel, UA: NONE SEEN /hpf

## 2019-05-06 LAB — URINE CULTURE

## 2019-05-22 DIAGNOSIS — I10 Essential (primary) hypertension: Secondary | ICD-10-CM | POA: Diagnosis not present

## 2019-05-22 DIAGNOSIS — R404 Transient alteration of awareness: Secondary | ICD-10-CM | POA: Diagnosis not present

## 2019-05-22 DIAGNOSIS — R5383 Other fatigue: Secondary | ICD-10-CM | POA: Diagnosis not present

## 2019-05-22 DIAGNOSIS — L292 Pruritus vulvae: Secondary | ICD-10-CM | POA: Diagnosis not present

## 2019-05-22 DIAGNOSIS — R109 Unspecified abdominal pain: Secondary | ICD-10-CM | POA: Diagnosis not present

## 2019-05-22 DIAGNOSIS — Z87440 Personal history of urinary (tract) infections: Secondary | ICD-10-CM | POA: Diagnosis not present

## 2019-05-22 DIAGNOSIS — R112 Nausea with vomiting, unspecified: Secondary | ICD-10-CM | POA: Diagnosis not present

## 2019-05-23 DIAGNOSIS — R109 Unspecified abdominal pain: Secondary | ICD-10-CM | POA: Diagnosis not present

## 2019-05-26 DIAGNOSIS — R69 Illness, unspecified: Secondary | ICD-10-CM | POA: Diagnosis not present

## 2019-05-26 DIAGNOSIS — G3184 Mild cognitive impairment, so stated: Secondary | ICD-10-CM | POA: Diagnosis not present

## 2019-05-26 DIAGNOSIS — Z880 Allergy status to penicillin: Secondary | ICD-10-CM | POA: Diagnosis not present

## 2019-05-26 DIAGNOSIS — E669 Obesity, unspecified: Secondary | ICD-10-CM | POA: Diagnosis not present

## 2019-05-26 DIAGNOSIS — N3941 Urge incontinence: Secondary | ICD-10-CM | POA: Diagnosis not present

## 2019-05-26 DIAGNOSIS — K219 Gastro-esophageal reflux disease without esophagitis: Secondary | ICD-10-CM | POA: Diagnosis not present

## 2019-05-26 DIAGNOSIS — E785 Hyperlipidemia, unspecified: Secondary | ICD-10-CM | POA: Diagnosis not present

## 2019-05-26 DIAGNOSIS — Z8673 Personal history of transient ischemic attack (TIA), and cerebral infarction without residual deficits: Secondary | ICD-10-CM | POA: Diagnosis not present

## 2019-05-26 DIAGNOSIS — Z87440 Personal history of urinary (tract) infections: Secondary | ICD-10-CM | POA: Diagnosis not present

## 2019-05-26 DIAGNOSIS — I10 Essential (primary) hypertension: Secondary | ICD-10-CM | POA: Diagnosis not present

## 2019-05-29 ENCOUNTER — Telehealth: Payer: Self-pay | Admitting: Nurse Practitioner

## 2019-05-29 DIAGNOSIS — N390 Urinary tract infection, site not specified: Secondary | ICD-10-CM

## 2019-05-29 NOTE — Telephone Encounter (Signed)
Returned call.  Patient went to [facility] 05/22/19 for UTI and was given Macrobid.  Patient is going to start abx and let us know how she is.  [facility] never called patient to let her know she had a UTI so she is going to get rx today- had received call from pharmacy.  Also wanting to know if we can do a referral for urology for frequent UTI's? Please advise   Call back Lilly since patient is confused

## 2019-05-29 NOTE — Telephone Encounter (Signed)
Do we need to schedule an office visit or telephone visit?

## 2019-05-29 NOTE — Telephone Encounter (Signed)
Can  you call her and ee what she is talking about first

## 2019-06-09 DIAGNOSIS — E785 Hyperlipidemia, unspecified: Secondary | ICD-10-CM | POA: Diagnosis not present

## 2019-06-09 DIAGNOSIS — K529 Noninfective gastroenteritis and colitis, unspecified: Secondary | ICD-10-CM | POA: Diagnosis not present

## 2019-06-09 DIAGNOSIS — I10 Essential (primary) hypertension: Secondary | ICD-10-CM | POA: Diagnosis not present

## 2019-06-09 DIAGNOSIS — I959 Hypotension, unspecified: Secondary | ICD-10-CM | POA: Diagnosis not present

## 2019-06-09 DIAGNOSIS — Z20822 Contact with and (suspected) exposure to covid-19: Secondary | ICD-10-CM | POA: Diagnosis not present

## 2019-06-09 DIAGNOSIS — K6389 Other specified diseases of intestine: Secondary | ICD-10-CM | POA: Diagnosis not present

## 2019-06-09 DIAGNOSIS — K449 Diaphragmatic hernia without obstruction or gangrene: Secondary | ICD-10-CM | POA: Diagnosis not present

## 2019-06-09 DIAGNOSIS — N390 Urinary tract infection, site not specified: Secondary | ICD-10-CM | POA: Diagnosis not present

## 2019-06-09 DIAGNOSIS — R0902 Hypoxemia: Secondary | ICD-10-CM | POA: Diagnosis not present

## 2019-06-09 DIAGNOSIS — I700 Atherosclerosis of aorta: Secondary | ICD-10-CM | POA: Diagnosis not present

## 2019-06-09 DIAGNOSIS — R109 Unspecified abdominal pain: Secondary | ICD-10-CM | POA: Diagnosis not present

## 2019-06-09 DIAGNOSIS — R410 Disorientation, unspecified: Secondary | ICD-10-CM | POA: Diagnosis not present

## 2019-06-09 DIAGNOSIS — Z9181 History of falling: Secondary | ICD-10-CM | POA: Diagnosis not present

## 2019-06-09 DIAGNOSIS — R0689 Other abnormalities of breathing: Secondary | ICD-10-CM | POA: Diagnosis not present

## 2019-06-09 DIAGNOSIS — Z881 Allergy status to other antibiotic agents status: Secondary | ICD-10-CM | POA: Diagnosis not present

## 2019-06-09 DIAGNOSIS — N3289 Other specified disorders of bladder: Secondary | ICD-10-CM | POA: Diagnosis not present

## 2019-06-09 DIAGNOSIS — M6281 Muscle weakness (generalized): Secondary | ICD-10-CM | POA: Diagnosis not present

## 2019-06-09 DIAGNOSIS — Z880 Allergy status to penicillin: Secondary | ICD-10-CM | POA: Diagnosis not present

## 2019-06-09 DIAGNOSIS — R000 Tachycardia, unspecified: Secondary | ICD-10-CM | POA: Diagnosis not present

## 2019-06-09 DIAGNOSIS — R918 Other nonspecific abnormal finding of lung field: Secondary | ICD-10-CM | POA: Diagnosis not present

## 2019-06-10 DIAGNOSIS — K5120 Ulcerative (chronic) proctitis without complications: Secondary | ICD-10-CM | POA: Diagnosis not present

## 2019-06-10 DIAGNOSIS — R109 Unspecified abdominal pain: Secondary | ICD-10-CM | POA: Diagnosis not present

## 2019-06-10 DIAGNOSIS — R5381 Other malaise: Secondary | ICD-10-CM | POA: Diagnosis not present

## 2019-06-10 DIAGNOSIS — K529 Noninfective gastroenteritis and colitis, unspecified: Secondary | ICD-10-CM | POA: Diagnosis not present

## 2019-06-10 DIAGNOSIS — N390 Urinary tract infection, site not specified: Secondary | ICD-10-CM | POA: Diagnosis not present

## 2019-06-10 DIAGNOSIS — R410 Disorientation, unspecified: Secondary | ICD-10-CM | POA: Diagnosis not present

## 2019-06-10 DIAGNOSIS — I10 Essential (primary) hypertension: Secondary | ICD-10-CM | POA: Diagnosis not present

## 2019-06-10 DIAGNOSIS — D72829 Elevated white blood cell count, unspecified: Secondary | ICD-10-CM | POA: Diagnosis not present

## 2019-06-10 DIAGNOSIS — I700 Atherosclerosis of aorta: Secondary | ICD-10-CM | POA: Diagnosis not present

## 2019-06-10 DIAGNOSIS — Z9181 History of falling: Secondary | ICD-10-CM | POA: Diagnosis not present

## 2019-06-10 DIAGNOSIS — M6281 Muscle weakness (generalized): Secondary | ICD-10-CM | POA: Diagnosis not present

## 2019-06-10 DIAGNOSIS — R112 Nausea with vomiting, unspecified: Secondary | ICD-10-CM | POA: Diagnosis not present

## 2019-06-10 DIAGNOSIS — E870 Hyperosmolality and hypernatremia: Secondary | ICD-10-CM | POA: Diagnosis not present

## 2019-06-10 DIAGNOSIS — Z881 Allergy status to other antibiotic agents status: Secondary | ICD-10-CM | POA: Diagnosis not present

## 2019-06-10 DIAGNOSIS — R262 Difficulty in walking, not elsewhere classified: Secondary | ICD-10-CM | POA: Diagnosis not present

## 2019-06-10 DIAGNOSIS — Z880 Allergy status to penicillin: Secondary | ICD-10-CM | POA: Diagnosis not present

## 2019-06-10 DIAGNOSIS — R52 Pain, unspecified: Secondary | ICD-10-CM | POA: Diagnosis not present

## 2019-06-10 DIAGNOSIS — K6389 Other specified diseases of intestine: Secondary | ICD-10-CM | POA: Diagnosis not present

## 2019-06-10 DIAGNOSIS — R918 Other nonspecific abnormal finding of lung field: Secondary | ICD-10-CM | POA: Diagnosis not present

## 2019-06-10 DIAGNOSIS — K449 Diaphragmatic hernia without obstruction or gangrene: Secondary | ICD-10-CM | POA: Diagnosis not present

## 2019-06-10 DIAGNOSIS — K5900 Constipation, unspecified: Secondary | ICD-10-CM | POA: Diagnosis not present

## 2019-06-10 DIAGNOSIS — R279 Unspecified lack of coordination: Secondary | ICD-10-CM | POA: Diagnosis not present

## 2019-06-10 DIAGNOSIS — Z20822 Contact with and (suspected) exposure to covid-19: Secondary | ICD-10-CM | POA: Diagnosis not present

## 2019-06-10 DIAGNOSIS — E785 Hyperlipidemia, unspecified: Secondary | ICD-10-CM | POA: Diagnosis not present

## 2019-06-10 DIAGNOSIS — R1084 Generalized abdominal pain: Secondary | ICD-10-CM | POA: Diagnosis not present

## 2019-06-10 DIAGNOSIS — N3289 Other specified disorders of bladder: Secondary | ICD-10-CM | POA: Diagnosis not present

## 2019-06-10 DIAGNOSIS — Z743 Need for continuous supervision: Secondary | ICD-10-CM | POA: Diagnosis not present

## 2019-06-15 ENCOUNTER — Telehealth: Payer: Self-pay | Admitting: Nurse Practitioner

## 2019-06-15 NOTE — Telephone Encounter (Signed)
Sh eis gong to rehab- will they be able to get her to her appointment

## 2019-06-15 NOTE — Telephone Encounter (Signed)
Daughter states that patient is being transferred to [facility] [place] rehabilitation today or tomorrow and will need a referral to Neurosurgery for Seizures/Dementia from PCP per Dr. Leory Plowman. Daughter states that she is unable to bring patient in to office.

## 2019-06-20 DIAGNOSIS — M6281 Muscle weakness (generalized): Secondary | ICD-10-CM | POA: Diagnosis not present

## 2019-06-20 DIAGNOSIS — I10 Essential (primary) hypertension: Secondary | ICD-10-CM | POA: Diagnosis not present

## 2019-06-20 DIAGNOSIS — Z743 Need for continuous supervision: Secondary | ICD-10-CM | POA: Diagnosis not present

## 2019-06-20 DIAGNOSIS — Z880 Allergy status to penicillin: Secondary | ICD-10-CM | POA: Diagnosis not present

## 2019-06-20 DIAGNOSIS — R52 Pain, unspecified: Secondary | ICD-10-CM | POA: Diagnosis not present

## 2019-06-20 DIAGNOSIS — R279 Unspecified lack of coordination: Secondary | ICD-10-CM | POA: Diagnosis not present

## 2019-06-20 DIAGNOSIS — R930 Abnormal findings on diagnostic imaging of skull and head, not elsewhere classified: Secondary | ICD-10-CM | POA: Diagnosis not present

## 2019-06-20 DIAGNOSIS — R262 Difficulty in walking, not elsewhere classified: Secondary | ICD-10-CM | POA: Diagnosis not present

## 2019-06-20 DIAGNOSIS — K5120 Ulcerative (chronic) proctitis without complications: Secondary | ICD-10-CM | POA: Diagnosis not present

## 2019-06-20 DIAGNOSIS — N3289 Other specified disorders of bladder: Secondary | ICD-10-CM | POA: Diagnosis not present

## 2019-06-20 DIAGNOSIS — F330 Major depressive disorder, recurrent, mild: Secondary | ICD-10-CM | POA: Diagnosis not present

## 2019-06-20 DIAGNOSIS — F329 Major depressive disorder, single episode, unspecified: Secondary | ICD-10-CM | POA: Diagnosis not present

## 2019-06-20 DIAGNOSIS — K449 Diaphragmatic hernia without obstruction or gangrene: Secondary | ICD-10-CM | POA: Diagnosis not present

## 2019-06-20 DIAGNOSIS — R109 Unspecified abdominal pain: Secondary | ICD-10-CM | POA: Diagnosis not present

## 2019-06-20 DIAGNOSIS — R112 Nausea with vomiting, unspecified: Secondary | ICD-10-CM | POA: Diagnosis not present

## 2019-06-20 DIAGNOSIS — I700 Atherosclerosis of aorta: Secondary | ICD-10-CM | POA: Diagnosis not present

## 2019-06-20 DIAGNOSIS — N390 Urinary tract infection, site not specified: Secondary | ICD-10-CM | POA: Diagnosis not present

## 2019-06-20 DIAGNOSIS — R410 Disorientation, unspecified: Secondary | ICD-10-CM | POA: Diagnosis not present

## 2019-06-20 DIAGNOSIS — K5900 Constipation, unspecified: Secondary | ICD-10-CM | POA: Diagnosis not present

## 2019-06-20 DIAGNOSIS — R918 Other nonspecific abnormal finding of lung field: Secondary | ICD-10-CM | POA: Diagnosis not present

## 2019-06-20 DIAGNOSIS — K529 Noninfective gastroenteritis and colitis, unspecified: Secondary | ICD-10-CM | POA: Diagnosis not present

## 2019-06-20 DIAGNOSIS — Z881 Allergy status to other antibiotic agents status: Secondary | ICD-10-CM | POA: Diagnosis not present

## 2019-06-20 DIAGNOSIS — M4800 Spinal stenosis, site unspecified: Secondary | ICD-10-CM | POA: Diagnosis not present

## 2019-06-20 DIAGNOSIS — E785 Hyperlipidemia, unspecified: Secondary | ICD-10-CM | POA: Diagnosis not present

## 2019-06-25 DIAGNOSIS — F330 Major depressive disorder, recurrent, mild: Secondary | ICD-10-CM | POA: Diagnosis not present

## 2019-06-25 DIAGNOSIS — R918 Other nonspecific abnormal finding of lung field: Secondary | ICD-10-CM | POA: Diagnosis not present

## 2019-06-25 DIAGNOSIS — K529 Noninfective gastroenteritis and colitis, unspecified: Secondary | ICD-10-CM | POA: Diagnosis not present

## 2019-07-02 DIAGNOSIS — F329 Major depressive disorder, single episode, unspecified: Secondary | ICD-10-CM | POA: Diagnosis not present

## 2019-07-02 DIAGNOSIS — K529 Noninfective gastroenteritis and colitis, unspecified: Secondary | ICD-10-CM | POA: Diagnosis not present

## 2019-07-02 DIAGNOSIS — M4800 Spinal stenosis, site unspecified: Secondary | ICD-10-CM | POA: Diagnosis not present

## 2019-07-02 DIAGNOSIS — R930 Abnormal findings on diagnostic imaging of skull and head, not elsewhere classified: Secondary | ICD-10-CM | POA: Diagnosis not present

## 2019-07-21 ENCOUNTER — Ambulatory Visit (INDEPENDENT_AMBULATORY_CARE_PROVIDER_SITE_OTHER): Payer: Medicare HMO | Admitting: Nurse Practitioner

## 2019-07-21 ENCOUNTER — Other Ambulatory Visit: Payer: Self-pay

## 2019-07-21 ENCOUNTER — Encounter: Payer: Self-pay | Admitting: Nurse Practitioner

## 2019-07-21 VITALS — BP 136/80 | HR 79 | Temp 98.2°F | Ht 62.0 in | Wt 198.6 lb

## 2019-07-21 DIAGNOSIS — Z9181 History of falling: Secondary | ICD-10-CM

## 2019-07-21 NOTE — Patient Instructions (Signed)

## 2019-07-21 NOTE — Progress Notes (Signed)
Subjective:    Patient ID: Tracie Wiggins, female    DOB: 27-Oct-1945, 74 y.o.   MRN: [id number]  Chief Complaint: Rehabilitation Follow up   HPI Admitted to hospital for colitis, June 8-July 11th rehab facility for weakness, not being able to walk/perform ADLs. Pt lives at home alone. No recent falls, no n/v/d, No UTI sx's. feels as though she is back to normal. . Here for new PT referral.    Review of Systems  Constitutional: Negative.   HENT: Negative.   Eyes: Negative.   Respiratory: Negative.   Cardiovascular: Negative.   Gastrointestinal: Negative.   Endocrine: Negative.   Genitourinary: Negative.   Musculoskeletal: Negative.   Skin: Negative.   Allergic/Immunologic: Negative.   Neurological: Negative.   Hematological: Negative.   Psychiatric/Behavioral: Negative.   All other systems reviewed and are negative.      Objective:   Physical Exam Vitals and nursing note reviewed.  Constitutional:      Appearance: Normal appearance. She is normal weight.  HENT:     Head: Normocephalic and atraumatic.     Right Ear: Tympanic membrane, ear canal and external ear normal.     Left Ear: Tympanic membrane, ear canal and external ear normal.     Nose: Nose normal.     Mouth/Throat:     Mouth: Mucous membranes are moist.     Pharynx: Oropharynx is clear.  Eyes:     Extraocular Movements: Extraocular movements intact.     Conjunctiva/sclera: Conjunctivae normal.     Pupils: Pupils are equal, round, and reactive to light.  Cardiovascular:     Rate and Rhythm: Normal rate and regular rhythm.     Pulses: Normal pulses.     Heart sounds: Normal heart sounds.  Pulmonary:     Effort: Pulmonary effort is normal.     Breath sounds: Normal breath sounds.  Abdominal:     General: Bowel sounds are normal.  Musculoskeletal:        General: Normal range of motion.     Cervical back: Normal range of motion and neck supple.  Skin:    General: Skin is warm and dry.      Capillary Refill: Capillary refill takes less than 2 seconds.  Neurological:     General: No focal deficit present.     Mental Status: She is oriented to person, place, and time. Mental status is at baseline.  Psychiatric:        Mood and Affect: Mood normal.        Behavior: Behavior normal.        Thought Content: Thought content normal.        Judgment: Judgment normal.    BP 136/80   Pulse 79   Temp 98.2 F (36.8 C) (Temporal)   Ht 5\' 2"  (1.575 m)   Wt 198 lb 9.6 oz (90.1 kg)   BMI 36.32 kg/m       Assessment & Plan:  Tracie Wiggins in today with chief complaint of Rehabilitation Follow up   1. History of fall Fall prevention hand out given follow up in october - Ambulatory referral to [facility]    The above assessment and management plan was discussed with the patient. The patient verbalized understanding of and has agreed to the management plan. Patient is aware to call the clinic if symptoms persist or worsen. Patient is aware when to return to the clinic for a follow-up visit. Patient educated on when it is  appropriate to go to the emergency department.   Mary-Margaret Hassell Done, FNP

## 2019-07-22 DIAGNOSIS — I10 Essential (primary) hypertension: Secondary | ICD-10-CM | POA: Diagnosis not present

## 2019-07-22 DIAGNOSIS — F330 Major depressive disorder, recurrent, mild: Secondary | ICD-10-CM | POA: Diagnosis not present

## 2019-07-22 DIAGNOSIS — Z87440 Personal history of urinary (tract) infections: Secondary | ICD-10-CM | POA: Diagnosis not present

## 2019-07-22 DIAGNOSIS — I959 Hypotension, unspecified: Secondary | ICD-10-CM | POA: Diagnosis not present

## 2019-07-22 DIAGNOSIS — K8309 Other cholangitis: Secondary | ICD-10-CM | POA: Diagnosis not present

## 2019-07-22 DIAGNOSIS — Z9181 History of falling: Secondary | ICD-10-CM | POA: Diagnosis not present

## 2019-07-22 DIAGNOSIS — I700 Atherosclerosis of aorta: Secondary | ICD-10-CM | POA: Diagnosis not present

## 2019-07-22 DIAGNOSIS — K5900 Constipation, unspecified: Secondary | ICD-10-CM | POA: Diagnosis not present

## 2019-07-22 DIAGNOSIS — Z8673 Personal history of transient ischemic attack (TIA), and cerebral infarction without residual deficits: Secondary | ICD-10-CM | POA: Diagnosis not present

## 2019-07-24 DIAGNOSIS — I959 Hypotension, unspecified: Secondary | ICD-10-CM | POA: Diagnosis not present

## 2019-07-24 DIAGNOSIS — K5900 Constipation, unspecified: Secondary | ICD-10-CM | POA: Diagnosis not present

## 2019-07-24 DIAGNOSIS — I10 Essential (primary) hypertension: Secondary | ICD-10-CM | POA: Diagnosis not present

## 2019-07-24 DIAGNOSIS — K8309 Other cholangitis: Secondary | ICD-10-CM | POA: Diagnosis not present

## 2019-07-24 DIAGNOSIS — Z9181 History of falling: Secondary | ICD-10-CM | POA: Diagnosis not present

## 2019-07-24 DIAGNOSIS — I700 Atherosclerosis of aorta: Secondary | ICD-10-CM | POA: Diagnosis not present

## 2019-07-24 DIAGNOSIS — Z87440 Personal history of urinary (tract) infections: Secondary | ICD-10-CM | POA: Diagnosis not present

## 2019-07-24 DIAGNOSIS — F330 Major depressive disorder, recurrent, mild: Secondary | ICD-10-CM | POA: Diagnosis not present

## 2019-07-24 DIAGNOSIS — Z8673 Personal history of transient ischemic attack (TIA), and cerebral infarction without residual deficits: Secondary | ICD-10-CM | POA: Diagnosis not present

## 2019-07-29 DIAGNOSIS — Z87440 Personal history of urinary (tract) infections: Secondary | ICD-10-CM | POA: Diagnosis not present

## 2019-07-29 DIAGNOSIS — I10 Essential (primary) hypertension: Secondary | ICD-10-CM | POA: Diagnosis not present

## 2019-07-29 DIAGNOSIS — I959 Hypotension, unspecified: Secondary | ICD-10-CM | POA: Diagnosis not present

## 2019-07-29 DIAGNOSIS — K8309 Other cholangitis: Secondary | ICD-10-CM | POA: Diagnosis not present

## 2019-07-29 DIAGNOSIS — Z8673 Personal history of transient ischemic attack (TIA), and cerebral infarction without residual deficits: Secondary | ICD-10-CM | POA: Diagnosis not present

## 2019-07-29 DIAGNOSIS — Z9181 History of falling: Secondary | ICD-10-CM | POA: Diagnosis not present

## 2019-07-29 DIAGNOSIS — F330 Major depressive disorder, recurrent, mild: Secondary | ICD-10-CM | POA: Diagnosis not present

## 2019-07-29 DIAGNOSIS — K5900 Constipation, unspecified: Secondary | ICD-10-CM | POA: Diagnosis not present

## 2019-07-29 DIAGNOSIS — I700 Atherosclerosis of aorta: Secondary | ICD-10-CM | POA: Diagnosis not present

## 2019-07-30 DIAGNOSIS — K8309 Other cholangitis: Secondary | ICD-10-CM | POA: Diagnosis not present

## 2019-07-30 DIAGNOSIS — K5900 Constipation, unspecified: Secondary | ICD-10-CM | POA: Diagnosis not present

## 2019-07-30 DIAGNOSIS — F330 Major depressive disorder, recurrent, mild: Secondary | ICD-10-CM | POA: Diagnosis not present

## 2019-07-30 DIAGNOSIS — I700 Atherosclerosis of aorta: Secondary | ICD-10-CM | POA: Diagnosis not present

## 2019-07-30 DIAGNOSIS — Z9181 History of falling: Secondary | ICD-10-CM | POA: Diagnosis not present

## 2019-07-30 DIAGNOSIS — Z87440 Personal history of urinary (tract) infections: Secondary | ICD-10-CM | POA: Diagnosis not present

## 2019-07-30 DIAGNOSIS — I959 Hypotension, unspecified: Secondary | ICD-10-CM | POA: Diagnosis not present

## 2019-07-30 DIAGNOSIS — Z8673 Personal history of transient ischemic attack (TIA), and cerebral infarction without residual deficits: Secondary | ICD-10-CM | POA: Diagnosis not present

## 2019-07-30 DIAGNOSIS — I10 Essential (primary) hypertension: Secondary | ICD-10-CM | POA: Diagnosis not present

## 2019-08-04 DIAGNOSIS — F330 Major depressive disorder, recurrent, mild: Secondary | ICD-10-CM | POA: Diagnosis not present

## 2019-08-04 DIAGNOSIS — I959 Hypotension, unspecified: Secondary | ICD-10-CM | POA: Diagnosis not present

## 2019-08-04 DIAGNOSIS — Z87440 Personal history of urinary (tract) infections: Secondary | ICD-10-CM | POA: Diagnosis not present

## 2019-08-04 DIAGNOSIS — K8309 Other cholangitis: Secondary | ICD-10-CM | POA: Diagnosis not present

## 2019-08-04 DIAGNOSIS — Z9181 History of falling: Secondary | ICD-10-CM | POA: Diagnosis not present

## 2019-08-04 DIAGNOSIS — Z8673 Personal history of transient ischemic attack (TIA), and cerebral infarction without residual deficits: Secondary | ICD-10-CM | POA: Diagnosis not present

## 2019-08-04 DIAGNOSIS — I700 Atherosclerosis of aorta: Secondary | ICD-10-CM | POA: Diagnosis not present

## 2019-08-04 DIAGNOSIS — I10 Essential (primary) hypertension: Secondary | ICD-10-CM | POA: Diagnosis not present

## 2019-08-04 DIAGNOSIS — K5900 Constipation, unspecified: Secondary | ICD-10-CM | POA: Diagnosis not present

## 2019-08-05 ENCOUNTER — Telehealth: Payer: Self-pay | Admitting: Nurse Practitioner

## 2019-08-05 NOTE — Telephone Encounter (Signed)
Tracie Wiggins PT from [facility] wants to let pcp know that pt fell 08/01/2019 no injuries--JUST FYI

## 2019-08-06 DIAGNOSIS — Z8673 Personal history of transient ischemic attack (TIA), and cerebral infarction without residual deficits: Secondary | ICD-10-CM | POA: Diagnosis not present

## 2019-08-06 DIAGNOSIS — Z9181 History of falling: Secondary | ICD-10-CM | POA: Diagnosis not present

## 2019-08-06 DIAGNOSIS — I700 Atherosclerosis of aorta: Secondary | ICD-10-CM | POA: Diagnosis not present

## 2019-08-06 DIAGNOSIS — K8309 Other cholangitis: Secondary | ICD-10-CM | POA: Diagnosis not present

## 2019-08-06 DIAGNOSIS — I10 Essential (primary) hypertension: Secondary | ICD-10-CM | POA: Diagnosis not present

## 2019-08-06 DIAGNOSIS — K5900 Constipation, unspecified: Secondary | ICD-10-CM | POA: Diagnosis not present

## 2019-08-06 DIAGNOSIS — F330 Major depressive disorder, recurrent, mild: Secondary | ICD-10-CM | POA: Diagnosis not present

## 2019-08-06 DIAGNOSIS — I959 Hypotension, unspecified: Secondary | ICD-10-CM | POA: Diagnosis not present

## 2019-08-06 DIAGNOSIS — Z87440 Personal history of urinary (tract) infections: Secondary | ICD-10-CM | POA: Diagnosis not present

## 2019-08-10 DIAGNOSIS — K8309 Other cholangitis: Secondary | ICD-10-CM | POA: Diagnosis not present

## 2019-08-10 DIAGNOSIS — I959 Hypotension, unspecified: Secondary | ICD-10-CM | POA: Diagnosis not present

## 2019-08-10 DIAGNOSIS — Z8673 Personal history of transient ischemic attack (TIA), and cerebral infarction without residual deficits: Secondary | ICD-10-CM | POA: Diagnosis not present

## 2019-08-10 DIAGNOSIS — Z87440 Personal history of urinary (tract) infections: Secondary | ICD-10-CM | POA: Diagnosis not present

## 2019-08-10 DIAGNOSIS — K5900 Constipation, unspecified: Secondary | ICD-10-CM | POA: Diagnosis not present

## 2019-08-10 DIAGNOSIS — I10 Essential (primary) hypertension: Secondary | ICD-10-CM | POA: Diagnosis not present

## 2019-08-10 DIAGNOSIS — F330 Major depressive disorder, recurrent, mild: Secondary | ICD-10-CM | POA: Diagnosis not present

## 2019-08-10 DIAGNOSIS — I700 Atherosclerosis of aorta: Secondary | ICD-10-CM | POA: Diagnosis not present

## 2019-08-10 DIAGNOSIS — Z9181 History of falling: Secondary | ICD-10-CM | POA: Diagnosis not present

## 2019-08-11 DIAGNOSIS — K8309 Other cholangitis: Secondary | ICD-10-CM | POA: Diagnosis not present

## 2019-08-11 DIAGNOSIS — F330 Major depressive disorder, recurrent, mild: Secondary | ICD-10-CM | POA: Diagnosis not present

## 2019-08-11 DIAGNOSIS — I700 Atherosclerosis of aorta: Secondary | ICD-10-CM | POA: Diagnosis not present

## 2019-08-11 DIAGNOSIS — Z87440 Personal history of urinary (tract) infections: Secondary | ICD-10-CM | POA: Diagnosis not present

## 2019-08-11 DIAGNOSIS — I10 Essential (primary) hypertension: Secondary | ICD-10-CM | POA: Diagnosis not present

## 2019-08-11 DIAGNOSIS — I959 Hypotension, unspecified: Secondary | ICD-10-CM | POA: Diagnosis not present

## 2019-08-11 DIAGNOSIS — Z9181 History of falling: Secondary | ICD-10-CM | POA: Diagnosis not present

## 2019-08-11 DIAGNOSIS — K5900 Constipation, unspecified: Secondary | ICD-10-CM | POA: Diagnosis not present

## 2019-08-11 DIAGNOSIS — Z8673 Personal history of transient ischemic attack (TIA), and cerebral infarction without residual deficits: Secondary | ICD-10-CM | POA: Diagnosis not present

## 2019-08-14 DIAGNOSIS — Z87440 Personal history of urinary (tract) infections: Secondary | ICD-10-CM | POA: Diagnosis not present

## 2019-08-14 DIAGNOSIS — I10 Essential (primary) hypertension: Secondary | ICD-10-CM | POA: Diagnosis not present

## 2019-08-14 DIAGNOSIS — Z8673 Personal history of transient ischemic attack (TIA), and cerebral infarction without residual deficits: Secondary | ICD-10-CM | POA: Diagnosis not present

## 2019-08-14 DIAGNOSIS — K5900 Constipation, unspecified: Secondary | ICD-10-CM | POA: Diagnosis not present

## 2019-08-14 DIAGNOSIS — K8309 Other cholangitis: Secondary | ICD-10-CM | POA: Diagnosis not present

## 2019-08-14 DIAGNOSIS — I700 Atherosclerosis of aorta: Secondary | ICD-10-CM | POA: Diagnosis not present

## 2019-08-14 DIAGNOSIS — F330 Major depressive disorder, recurrent, mild: Secondary | ICD-10-CM | POA: Diagnosis not present

## 2019-08-14 DIAGNOSIS — I959 Hypotension, unspecified: Secondary | ICD-10-CM | POA: Diagnosis not present

## 2019-08-14 DIAGNOSIS — Z9181 History of falling: Secondary | ICD-10-CM | POA: Diagnosis not present

## 2019-08-17 ENCOUNTER — Encounter (HOSPITAL_COMMUNITY): Payer: Self-pay

## 2019-08-17 ENCOUNTER — Inpatient Hospital Stay (HOSPITAL_COMMUNITY)
Admission: EM | Admit: 2019-08-17 | Discharge: 2019-08-26 | DRG: 025 | Disposition: A | Discharge status: Skilled Nursing Facility | Payer: Medicare HMO | Attending: Neurosurgery | Admitting: Neurosurgery

## 2019-08-17 ENCOUNTER — Emergency Department (HOSPITAL_COMMUNITY): Payer: Medicare HMO

## 2019-08-17 DIAGNOSIS — G911 Obstructive hydrocephalus: Secondary | ICD-10-CM | POA: Diagnosis present

## 2019-08-17 DIAGNOSIS — Z825 Family history of asthma and other chronic lower respiratory diseases: Secondary | ICD-10-CM

## 2019-08-17 DIAGNOSIS — R296 Repeated falls: Secondary | ICD-10-CM | POA: Diagnosis present

## 2019-08-17 DIAGNOSIS — F329 Major depressive disorder, single episode, unspecified: Secondary | ICD-10-CM | POA: Diagnosis present

## 2019-08-17 DIAGNOSIS — Z823 Family history of stroke: Secondary | ICD-10-CM

## 2019-08-17 DIAGNOSIS — K219 Gastro-esophageal reflux disease without esophagitis: Secondary | ICD-10-CM | POA: Diagnosis not present

## 2019-08-17 DIAGNOSIS — Z833 Family history of diabetes mellitus: Secondary | ICD-10-CM

## 2019-08-17 DIAGNOSIS — G919 Hydrocephalus, unspecified: Secondary | ICD-10-CM | POA: Diagnosis not present

## 2019-08-17 DIAGNOSIS — G939 Disorder of brain, unspecified: Secondary | ICD-10-CM | POA: Diagnosis not present

## 2019-08-17 DIAGNOSIS — D320 Benign neoplasm of cerebral meninges: Secondary | ICD-10-CM | POA: Diagnosis not present

## 2019-08-17 DIAGNOSIS — H02401 Unspecified ptosis of right eyelid: Secondary | ICD-10-CM | POA: Diagnosis not present

## 2019-08-17 DIAGNOSIS — I10 Essential (primary) hypertension: Secondary | ICD-10-CM | POA: Diagnosis present

## 2019-08-17 DIAGNOSIS — Z880 Allergy status to penicillin: Secondary | ICD-10-CM | POA: Diagnosis not present

## 2019-08-17 DIAGNOSIS — Z8249 Family history of ischemic heart disease and other diseases of the circulatory system: Secondary | ICD-10-CM

## 2019-08-17 DIAGNOSIS — R0902 Hypoxemia: Secondary | ICD-10-CM | POA: Diagnosis not present

## 2019-08-17 DIAGNOSIS — J8489 Other specified interstitial pulmonary diseases: Secondary | ICD-10-CM | POA: Diagnosis not present

## 2019-08-17 DIAGNOSIS — M810 Age-related osteoporosis without current pathological fracture: Secondary | ICD-10-CM | POA: Diagnosis not present

## 2019-08-17 DIAGNOSIS — R4701 Aphasia: Secondary | ICD-10-CM | POA: Diagnosis present

## 2019-08-17 DIAGNOSIS — Z7401 Bed confinement status: Secondary | ICD-10-CM | POA: Diagnosis not present

## 2019-08-17 DIAGNOSIS — R55 Syncope and collapse: Secondary | ICD-10-CM | POA: Diagnosis present

## 2019-08-17 DIAGNOSIS — Z452 Encounter for adjustment and management of vascular access device: Secondary | ICD-10-CM | POA: Diagnosis not present

## 2019-08-17 DIAGNOSIS — G935 Compression of brain: Secondary | ICD-10-CM | POA: Diagnosis present

## 2019-08-17 DIAGNOSIS — K5900 Constipation, unspecified: Secondary | ICD-10-CM | POA: Diagnosis not present

## 2019-08-17 DIAGNOSIS — Z483 Aftercare following surgery for neoplasm: Secondary | ICD-10-CM | POA: Diagnosis not present

## 2019-08-17 DIAGNOSIS — E785 Hyperlipidemia, unspecified: Secondary | ICD-10-CM | POA: Diagnosis present

## 2019-08-17 DIAGNOSIS — R270 Ataxia, unspecified: Secondary | ICD-10-CM | POA: Diagnosis not present

## 2019-08-17 DIAGNOSIS — R4182 Altered mental status, unspecified: Secondary | ICD-10-CM | POA: Diagnosis not present

## 2019-08-17 DIAGNOSIS — R220 Localized swelling, mass and lump, head: Secondary | ICD-10-CM | POA: Diagnosis not present

## 2019-08-17 DIAGNOSIS — Z886 Allergy status to analgesic agent status: Secondary | ICD-10-CM | POA: Diagnosis not present

## 2019-08-17 DIAGNOSIS — G319 Degenerative disease of nervous system, unspecified: Secondary | ICD-10-CM | POA: Diagnosis not present

## 2019-08-17 DIAGNOSIS — F418 Other specified anxiety disorders: Secondary | ICD-10-CM | POA: Diagnosis not present

## 2019-08-17 DIAGNOSIS — Z885 Allergy status to narcotic agent status: Secondary | ICD-10-CM

## 2019-08-17 DIAGNOSIS — I469 Cardiac arrest, cause unspecified: Secondary | ICD-10-CM | POA: Diagnosis not present

## 2019-08-17 DIAGNOSIS — F419 Anxiety disorder, unspecified: Secondary | ICD-10-CM | POA: Diagnosis present

## 2019-08-17 DIAGNOSIS — Z20822 Contact with and (suspected) exposure to covid-19: Secondary | ICD-10-CM | POA: Diagnosis not present

## 2019-08-17 DIAGNOSIS — G936 Cerebral edema: Secondary | ICD-10-CM | POA: Diagnosis not present

## 2019-08-17 DIAGNOSIS — I517 Cardiomegaly: Secondary | ICD-10-CM | POA: Diagnosis not present

## 2019-08-17 DIAGNOSIS — R569 Unspecified convulsions: Secondary | ICD-10-CM

## 2019-08-17 DIAGNOSIS — G40509 Epileptic seizures related to external causes, not intractable, without status epilepticus: Secondary | ICD-10-CM | POA: Diagnosis not present

## 2019-08-17 DIAGNOSIS — D496 Neoplasm of unspecified behavior of brain: Secondary | ICD-10-CM | POA: Diagnosis present

## 2019-08-17 DIAGNOSIS — J3489 Other specified disorders of nose and nasal sinuses: Secondary | ICD-10-CM | POA: Diagnosis not present

## 2019-08-17 DIAGNOSIS — Z888 Allergy status to other drugs, medicaments and biological substances status: Secondary | ICD-10-CM

## 2019-08-17 DIAGNOSIS — D329 Benign neoplasm of meninges, unspecified: Secondary | ICD-10-CM | POA: Diagnosis not present

## 2019-08-17 DIAGNOSIS — G40909 Epilepsy, unspecified, not intractable, without status epilepticus: Secondary | ICD-10-CM | POA: Diagnosis not present

## 2019-08-17 DIAGNOSIS — C719 Malignant neoplasm of brain, unspecified: Secondary | ICD-10-CM | POA: Diagnosis not present

## 2019-08-17 DIAGNOSIS — R531 Weakness: Secondary | ICD-10-CM | POA: Diagnosis not present

## 2019-08-17 DIAGNOSIS — G9389 Other specified disorders of brain: Secondary | ICD-10-CM | POA: Diagnosis not present

## 2019-08-17 DIAGNOSIS — D62 Acute posthemorrhagic anemia: Secondary | ICD-10-CM | POA: Diagnosis not present

## 2019-08-17 DIAGNOSIS — I700 Atherosclerosis of aorta: Secondary | ICD-10-CM | POA: Diagnosis not present

## 2019-08-17 DIAGNOSIS — R918 Other nonspecific abnormal finding of lung field: Secondary | ICD-10-CM | POA: Diagnosis not present

## 2019-08-17 DIAGNOSIS — M2550 Pain in unspecified joint: Secondary | ICD-10-CM | POA: Diagnosis not present

## 2019-08-17 LAB — CBC WITH DIFFERENTIAL/PLATELET
Abs Immature Granulocytes: 0.03 10*3/uL (ref 0.00–0.07)
Basophils Absolute: 0 10*3/uL (ref 0.0–0.1)
Basophils Relative: 0 %
Eosinophils Absolute: 0.1 10*3/uL (ref 0.0–0.5)
Eosinophils Relative: 0 %
HCT: 43.6 % (ref 36.0–46.0)
Hemoglobin: 13.7 g/dL (ref 12.0–15.0)
Immature Granulocytes: 0 %
Lymphocytes Relative: 14 %
Lymphs Abs: 1.6 10*3/uL (ref 0.7–4.0)
MCH: 30.2 pg (ref 26.0–34.0)
MCHC: 31.4 g/dL (ref 30.0–36.0)
MCV: 96 fL (ref 80.0–100.0)
Monocytes Absolute: 0.6 10*3/uL (ref 0.1–1.0)
Monocytes Relative: 5 %
Neutro Abs: 8.8 10*3/uL — ABNORMAL HIGH (ref 1.7–7.7)
Neutrophils Relative %: 81 %
Platelets: 287 10*3/uL (ref 150–400)
RBC: 4.54 MIL/uL (ref 3.87–5.11)
RDW: 14.2 % (ref 11.5–15.5)
WBC: 11.1 10*3/uL — ABNORMAL HIGH (ref 4.0–10.5)
nRBC: 0 % (ref 0.0–0.2)

## 2019-08-17 LAB — BASIC METABOLIC PANEL
Anion gap: 10 (ref 5–15)
BUN: 13 mg/dL (ref 8–23)
CO2: 27 mmol/L (ref 22–32)
Calcium: 9.3 mg/dL (ref 8.9–10.3)
Chloride: 102 mmol/L (ref 98–111)
Creatinine, Ser: 0.99 mg/dL (ref 0.44–1.00)
GFR calc Af Amer: >60 mL/min (ref >60)
GFR calc non Af Amer: 56 mL/min — ABNORMAL LOW (ref >60)
Glucose, Bld: 116 mg/dL — ABNORMAL HIGH (ref 70–99)
Potassium: 3.9 mmol/L (ref 3.5–5.1)
Sodium: 139 mmol/L (ref 135–145)

## 2019-08-17 LAB — CBG MONITORING, ED: Glucose-Capillary: 128 mg/dL — ABNORMAL HIGH (ref 70–99)

## 2019-08-17 MED ORDER — SODIUM CHLORIDE 0.9 % IV BOLUS
1000.0000 mL | Freq: Once | INTRAVENOUS | Status: AC
  Administered 2019-08-17: 1000 mL via INTRAVENOUS

## 2019-08-17 MED ORDER — DEXAMETHASONE SODIUM PHOSPHATE 10 MG/ML IJ SOLN
10.0000 mg | Freq: Once | INTRAMUSCULAR | Status: AC
  Administered 2019-08-17: 10 mg via INTRAVENOUS
  Filled 2019-08-17: qty 1

## 2019-08-17 MED ORDER — DEXAMETHASONE 4 MG PO TABS
4.0000 mg | ORAL_TABLET | Freq: Four times a day (QID) | ORAL | Status: DC
  Administered 2019-08-18 (×4): 4 mg via ORAL
  Filled 2019-08-17 (×4): qty 1

## 2019-08-17 MED ORDER — LEVETIRACETAM 500 MG PO TABS
1000.0000 mg | ORAL_TABLET | Freq: Two times a day (BID) | ORAL | Status: DC
  Administered 2019-08-18 (×2): 1000 mg via ORAL
  Filled 2019-08-17 (×2): qty 2

## 2019-08-17 MED ORDER — LEVETIRACETAM IN NACL 1000 MG/100ML IV SOLN
1000.0000 mg | Freq: Once | INTRAVENOUS | Status: AC
  Administered 2019-08-17: 1000 mg via INTRAVENOUS
  Filled 2019-08-17: qty 100

## 2019-08-17 MED ORDER — LACTATED RINGERS IV SOLN: INTRAVENOUS | Status: AC

## 2019-08-17 MED ORDER — LEVETIRACETAM 500 MG PO TABS: 500.0000 mg | ORAL_TABLET | Freq: Two times a day (BID) | ORAL | Status: DC

## 2019-08-17 MED ORDER — LORAZEPAM 2 MG/ML IJ SOLN: 2.0000 mg | Freq: Three times a day (TID) | INTRAMUSCULAR | Status: DC | PRN

## 2019-08-17 NOTE — Consult Note (Signed)
[facility] TeleNeurology Consult Services  Stat Consult  Date of Service:   08/17/2019 20:49:05  Impression:     .  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 Admit for MRI and NEUROSURGERY EVAL.  CT HEAD: There is a 4.6 cm x 3.8 cm well-defined isodense mass, with thin surrounding hyperdense rim, within the cerebellum on the right with associated mass effect and 1.1 cm right to left midline shift. MRI correlation is recommended.  Metrics: [facility] Notification Time: 08/17/2019 20:47:22 Stamp Time: 08/17/2019 20:49:05 Callback Response Time: 08/17/2019 20:50:21  Our recommendations are outlined below.  Recommendations:     .  Load With Keppra 1000 mg Now     .  Start Keppra 1000 mg BID     .  Monitor neuro checks/VS q4h with telemetry.     .  Recommend fall precautions and seizure precautions.     .  Goal SBP b/w 100-140.     [name]  Get NEUROSURGERY EVAL for right cerebellar mass and possible hydrocephalus.     .  Get MRI BRAIN W/ and W/O, EEG, and ECHO.     [name]  Get ESR/CRP, TROP, CK, BNP, LACTIC ACID, LIPID PANEL, and A1C.     .  Advised about driving restrictions.     .  Give Lorazepam 2 mg IV PRN for seizures > 3 min or > 3 episodes in one hour. Don't give more than 6-8 mg total in 24 hour period).     .  Please avoid aminophylline, theophylline, isoniazid, lindane, metronidazole, nalidixic acid, meropenem/imipenem, cefepime, TCAs, bupropion, cyclosporine, chlorambucil, tramadol, clozapine, or other drugs which can lower seizure threshold.  Imaging Studies:     .  MRI Head with and Without Contrast  Therapies:     .  Physical Therapy, Occupational Therapy, Speech Therapy Assessment When Applicable  Other WorkUp:     .  Infectious/metabolic workup per primary  team     .  Check an ammonia level     .  Check B12 level     .  Check TSH  Disposition: Neurology Follow Up Recommended  Sign Out:     .  Discussed with Emergency Department Provider  ----------------------------------------------------------------------------------------------------  Chief Complaint: AMS  History of Present Illness: Patient is a 74 year old Female.  Pt is a 54 YOF with PMH of HTN, HLD, HEADACHES, ANXIETY/DEPRESSION who presented with episodes of AMS and BI/UI. She had about 6 episode in last few months with AMS and BI/UI and confusion afterwards. She had one today. She went to bathroom and next thing she recalls is being in ambulance. She had BI/UI, unclear if any stiffening, shaking, automatisms associated with episodes. She does complain of some headaches and nausea/vomiting symptoms as well. She denied any double vision, speech changes, no arm/leg weakness, no incoordination. She denied any cancer history or any IVDA previously.   Past Medical History:     . Hypertension     . Hyperlipidemia     . There is NO history of Diabetes Mellitus     . There is NO history of Atrial Fibrillation     . There is NO history of Coronary Artery Disease     . There is NO history of Stroke  Anticoagulant use:  No  Antiplatelet use: No  Examination: BP(134/102), Pulse(59), Blood Glucose(128) 1A: Level of Consciousness - Alert; keenly responsive + 0 1B: Ask Month and Age - Both Questions Right + 0 1C: Blink Eyes & Squeeze Hands - Performs Both Tasks + 0 2: Test Horizontal Extraocular Movements - Normal + 0 3: Test Visual Fields - No Visual Loss + 0 4: Test Facial Palsy (Use Grimace if Obtunded) - Minor paralysis (flat nasolabial fold, smile asymmetry) + 1 5A: Test Left Arm Motor Drift - No Drift for 10 Seconds + 0 5B: Test Right Arm Motor Drift - No Drift for 10 Seconds + 0 6A: Test Left Leg Motor Drift - No Drift for 5 Seconds + 0 6B: Test Right Leg Motor Drift -  No Drift for 5 Seconds + 0 7: Test Limb Ataxia (FNF/Heel-Shin) - No Ataxia + 0 8: Test Sensation - Normal; No sensory loss + 0 9: Test Language/Aphasia - Mild-Moderate Aphasia: Some Obvious Changes, Without Significant Limitation + 1 10: Test Dysarthria - Mild-Moderate Dysarthria: Slurring but can be understood + 1 11: Test Extinction/Inattention - No abnormality + 0  NIHSS Score: 3  She had slight right facial droop, slurred speech, and some difficulty with repeating.   Patient/Family was informed the Neurology Consult would occur via TeleHealth consult by way of interactive audio and video telecommunications and consented to receiving care in this manner.  Patient is being evaluated for possible acute neurologic impairment and high probability of imminent or life-threatening deterioration. I spent total of 30 minutes providing care to this patient, including time for face to face visit via telemedicine, review of medical records, imaging studies and discussion of findings with providers, the patient and/or family.   Dr Currie Paris   [facility] [phone]  Case [id number]

## 2019-08-17 NOTE — ED Triage Notes (Signed)
Pt reports that the last thing she remembers is that she had to have a BM, then she doesn't remember anything until loaded into ambulance. Pt has a caregiver and reports pt was sitting in chair and had syncopal episode. Caregiver reported tremor like activity

## 2019-08-17 NOTE — ED Provider Notes (Addendum)
[facility] Provider Note   CSN: [id number] Arrival date & time: 08/17/19  1709     History Chief Complaint  Patient presents with  . Loss of Consciousness    CADENCE MINTON is a 74 y.o. female.  HPI     74 year old female comes in a chief complaint of loss of consciousness. Patient has history of hypertension, hyperlipidemia and comes from her home.  I spoke with patient's daughter who reports that patient had gone to the bathroom.  While there she had a syncopal type episode.  Patient was noted to be shaking.  Patient has had about 4 episodes of seizure-like activity that have been witnessed by family.  The daughter herself witnessed an episode in June.  Patient has generalized shaking at that time.  She had bowel and bladder incontinence and was confused after the episode.  Patient also has been having difficulty with walking and was recently admitted at [facility].  After her close 2-week admission, she was sent to a rehab where she continues to have difficulty with ambulation.  Patient denies any recent fevers, chills, UTI-like symptoms, cough, chest pain.   Past Medical History:  Diagnosis Date  . Allergy   . Anxiety   . Arthritis   . Cataract   . Depression   . GERD (gastroesophageal reflux disease)   . Headache   . Hyperlipidemia   . Hypertension   . Osteoporosis   . Shingles    twice    Patient Active Problem List   Diagnosis Date Noted  . Brain tumor ([place]) 08/17/2019  . Fall 04/06/2019  . Morbid obesity ([place]) 10/04/2014  . OAB (overactive bladder) 04/07/2014  . Hypertension 12/22/2012  . Hyperlipidemia 06/13/2012  . GERD (gastroesophageal reflux disease) 06/13/2012  . Osteoporosis 06/13/2012  . Depression 06/13/2012    Past Surgical History:  Procedure Laterality Date  . ABDOMINAL HYSTERECTOMY    . APPENDECTOMY    . BIOPSY BREAST Right    Fluid filled cyst  . CATARACT EXTRACTION W/PHACO Left 07/20/2016   Procedure:  CATARACT EXTRACTION PHACO AND INTRAOCULAR LENS PLACEMENT (IOC);  Surgeon: Baruch Goldmann, MD;  Location: [facility];  Service: Ophthalmology;  Laterality: Left;  CDE: 3.19  . CATARACT EXTRACTION W/PHACO Right 09/07/2016   Procedure: CATARACT EXTRACTION PHACO AND INTRAOCULAR LENS PLACEMENT (IOC);  Surgeon: Baruch Goldmann, MD;  Location: [facility];  Service: Ophthalmology;  Laterality: Right;  CDE: 5.04  . CHOLECYSTECTOMY    . NASAL SINUS SURGERY       OB History   No obstetric history on file.     Family History  Problem Relation Age of Onset  . COPD Mother   . Stroke Mother   . Heart disease Father   . Heart attack Father   . Heart disease Sister   . Diabetes Sister   . Heart attack Sister   . Stroke Sister   . Healthy Daughter     Social History   Tobacco Use  . Smoking status: Never Smoker  . Smokeless tobacco: Never Used  Vaping Use  . Vaping Use: Never used  Substance Use Topics  . Alcohol use: No  . Drug use: No    Home Medications Prior to Admission medications   Medication Sig Start Date End Date Taking? Authorizing Provider  omeprazole (PRILOSEC) 40 MG capsule Take 1 capsule (40 mg total) by mouth daily. 04/06/19   Hassell Done Mary-Margaret, FNP  sertraline (ZOLOFT) 25 MG tablet Take 0.5 tablets (12.5 mg total)  by mouth daily as needed (for anxiety). Only takes 0.5 tablet 04/06/19   Chevis Pretty, FNP    Allergies    Asa [aspirin], Codeine, Erythromycin, Penicillins, and Niacin and related  Review of Systems   Review of Systems  Constitutional: Positive for activity change. Negative for fever.  Respiratory: Negative for shortness of breath.   Cardiovascular: Negative for chest pain.  Neurological: Positive for dizziness.  Hematological: Does not bruise/bleed easily.  All other systems reviewed and are negative.   Physical Exam Updated Vital Signs BP 129/74   Pulse 60   Temp (!) 97.5 F (36.4 C) (Oral)   Resp 19   Ht 5\' 2"  (1.575 m)   Wt 86.2 kg   SpO2  92%   BMI 34.75 kg/m   Physical Exam Vitals and nursing note reviewed.  Constitutional:      Appearance: She is well-developed.  HENT:     Head: Atraumatic.  Eyes:     Extraocular Movements: Extraocular movements intact.     Pupils: Pupils are equal, round, and reactive to light.  Cardiovascular:     Rate and Rhythm: Normal rate.  Pulmonary:     Effort: Pulmonary effort is normal.  Abdominal:     General: Bowel sounds are normal.  Musculoskeletal:     Cervical back: Normal range of motion and neck supple.  Skin:    General: Skin is warm and dry.  Neurological:     Mental Status: She is alert and oriented to person, place, and time.     Cranial Nerves: No cranial nerve deficit.     ED Results / Procedures / Treatments   Labs (all labs ordered are listed, but only abnormal results are displayed) Labs Reviewed  BASIC METABOLIC PANEL - Abnormal; Notable for the following components:      Result Value   Glucose, Bld 116 (*)    GFR calc non Af Amer 56 (*)    All other components within normal limits  CBC WITH DIFFERENTIAL/PLATELET - Abnormal; Notable for the following components:   WBC 11.1 (*)    Neutro Abs 8.8 (*)    All other components within normal limits  CBG MONITORING, ED - Abnormal; Notable for the following components:   Glucose-Capillary 128 (*)    All other components within normal limits  SARS CORONAVIRUS 2 BY RT PCR (HOSPITAL ORDER, [facility])  URINALYSIS, ROUTINE W REFLEX MICROSCOPIC  SEDIMENTATION RATE  C-REACTIVE PROTEIN  BRAIN NATRIURETIC PEPTIDE  LIPID PANEL  HEMOGLOBIN A1C  TROPONIN I (HIGH SENSITIVITY)    EKG EKG Interpretation  Date/Time:  Monday August 17 2019 17:54:43 EDT Ventricular Rate:  67 PR Interval:    QRS Duration: 102 QT Interval:  425 QTC Calculation: 449 R Axis:   31 Text Interpretation: Sinus rhythm Abnormal R-wave progression, early transition No acute changes No significant change since last  tracing Confirmed by Varney Biles ([id number]) on 08/17/2019 7:58:59 PM   Radiology CT Head Wo Contrast  Result Date: 08/17/2019 CLINICAL DATA:  Seizure. EXAM: CT HEAD WITHOUT CONTRAST TECHNIQUE: Contiguous axial images were obtained from the base of the skull through the vertex without intravenous contrast. COMPARISON:  None. FINDINGS: Brain: There is mild cerebral atrophy with widening of the extra-axial spaces and ventricular dilatation. There are areas of decreased attenuation within the white matter tracts of the supratentorial brain, consistent with microvascular disease changes. A 4.6 cm x 3.8 cm well-defined isodense mass, with thin surrounding hyperdense rim, is seen within  the cerebellum on the right. There is a mild amount of medial vasogenic edema with mass effect on the fourth ventricle. Approximately 1.1 cm right to left midline shift is noted. Vascular: No hyperdense vessel or unexpected calcification. Skull: Normal. Negative for fracture or focal lesion. Sinuses/Orbits: No acute finding. Other: None. IMPRESSION: 4.6 cm x 3.8 cm well-defined isodense mass, with thin surrounding hyperdense rim, within the cerebellum on the right with associated mass effect and 1.1 cm right to left midline shift. MRI correlation is recommended. Electronically Signed   By: Virgina Norfolk M.D.   On: 08/17/2019 19:05    Procedures .Critical Care Performed by: Varney Biles, MD Authorized by: Varney Biles, MD   Critical care provider statement:    Critical care time (minutes):  110   Critical care was necessary to treat or prevent imminent or life-threatening deterioration of the following conditions:  CNS failure or compromise   Critical care was time spent personally by me on the following activities:  Discussions with consultants, evaluation of patient's response to treatment, examination of patient, ordering and performing treatments and interventions, ordering and review of laboratory studies,  ordering and review of radiographic studies, pulse oximetry, re-evaluation of patient's condition, obtaining history from patient or surrogate and review of old charts   (including critical care time)  Medications Ordered in ED Medications  dexamethasone (DECADRON) injection 10 mg (has no administration in time range)  lactated ringers infusion (has no administration in time range)  dexamethasone (DECADRON) tablet 4 mg (has no administration in time range)  levETIRAcetam (KEPPRA) tablet 1,000 mg (has no administration in time range)  LORazepam (ATIVAN) injection 2 mg (has no administration in time range)  sodium chloride 0.9 % bolus 1,000 mL (0 mLs Intravenous Stopped 08/17/19 2143)  levETIRAcetam (KEPPRA) IVPB 1000 mg/100 mL premix (0 mg Intravenous Stopped 08/17/19 2327)    ED Course  I have reviewed the triage vital signs and the nursing notes.  Pertinent labs & imaging results that were available during my care of the patient were reviewed by me and considered in my medical decision making (see chart for details).  Clinical Course as of Aug 17 2343  Mon Aug 17, 2019  2326 After the CT scan, I had consulted neurology.  Neurology team recommends getting neurosurgery on board given the brain tumor.  They suspect that patient likely has seizures and have recommended starting her on Keppra.  There was a delay in sending the page out to neurosurgery.  I just spoke with Dr. Annette Stable, he has reviewed the images and recommends that we start patient on Decadron.  I ordered the Decadron orders as per his request.  He wants the patient to be admitted to the ICU under his service, those admission orders have been placed as well.  Along with the scheduled Decadron, there is also now scheduled Keppra order in.  I also updated the patient and his daughter about the findings on CT scan.  MRI brain with and without also ordered.  Given patient's age and comorbidities, I will also page medicine to ensure  that patient does not decline from her other medical conditions.  CT Head Wo Contrast [AN]  0102 I was able to put in the basic lab orders, echo, EEG. Home meds also ordered. Medicine consult cancelled   [AN]    Clinical Course User Index [AN] Varney Biles, MD   MDM Rules/Calculators/A&P  74 year old female comes in a chief complaint of syncope.  Patient has multiple medical comorbidities and it appears that she was recently admitted to [facility] and was thereafter sent to a rehab facility.  After talking to patient's daughter, it appears that she might actually have had seizure.  Initially I thought that she might have had micturition syncope.  It appears that this is not the first time she had a seizure syncope type episode.  There is also some concerns with balance and ambulation.  We will initiate seizure work-up in the ER.  Goal is to rule out brain bleed, brain tumor and any electrolyte abnormality that can explain seizure.  We will also screen her for infectious disease.      Final Clinical Impression(s) / ED Diagnoses Final diagnoses:  Brain tumor [facility])  Seizure [facility])    Rx / DC Orders ED Discharge Orders    None       Varney Biles, MD 08/17/19 [place], Jacobo Moncrief, MD 08/17/19 2345

## 2019-08-18 ENCOUNTER — Inpatient Hospital Stay (HOSPITAL_COMMUNITY): Payer: Medicare HMO

## 2019-08-18 ENCOUNTER — Other Ambulatory Visit: Payer: Self-pay | Admitting: Neurosurgery

## 2019-08-18 ENCOUNTER — Telehealth: Payer: Self-pay | Admitting: Nurse Practitioner

## 2019-08-18 ENCOUNTER — Other Ambulatory Visit: Payer: Self-pay

## 2019-08-18 ENCOUNTER — Inpatient Hospital Stay (HOSPITAL_COMMUNITY)
Admit: 2019-08-18 | Discharge: 2019-08-18 | Disposition: A | Discharge status: Home / Self Care | Payer: Medicare HMO | Attending: Emergency Medicine | Admitting: Emergency Medicine

## 2019-08-18 DIAGNOSIS — R569 Unspecified convulsions: Secondary | ICD-10-CM

## 2019-08-18 DIAGNOSIS — D329 Benign neoplasm of meninges, unspecified: Secondary | ICD-10-CM | POA: Diagnosis present

## 2019-08-18 LAB — LIPID PANEL
Cholesterol: 237 mg/dL — ABNORMAL HIGH (ref 0–200)
HDL: 61 mg/dL (ref >40)
LDL Cholesterol: 156 mg/dL — ABNORMAL HIGH (ref 0–99)
Total CHOL/HDL Ratio: 3.9 RATIO
Triglycerides: 99 mg/dL (ref <150)
VLDL: 20 mg/dL (ref 0–40)

## 2019-08-18 LAB — HEMOGLOBIN A1C
Hgb A1c MFr Bld: 5.4 % (ref 4.8–5.6)
Mean Plasma Glucose: 108.28 mg/dL

## 2019-08-18 LAB — C-REACTIVE PROTEIN: CRP: 0.6 mg/dL (ref <1.0)

## 2019-08-18 LAB — MRSA PCR SCREENING: MRSA by PCR: NEGATIVE

## 2019-08-18 LAB — BRAIN NATRIURETIC PEPTIDE: B Natriuretic Peptide: 118 pg/mL — ABNORMAL HIGH (ref 0.0–100.0)

## 2019-08-18 LAB — SEDIMENTATION RATE: Sed Rate: 9 mm/hr (ref 0–22)

## 2019-08-18 LAB — TROPONIN I (HIGH SENSITIVITY)
Troponin I (High Sensitivity): 5 ng/L (ref <18)
Troponin I (High Sensitivity): 6 ng/L (ref <18)

## 2019-08-18 LAB — SARS CORONAVIRUS 2 BY RT PCR (HOSPITAL ORDER, PERFORMED IN ~~LOC~~ HOSPITAL LAB): SARS Coronavirus 2: NEGATIVE

## 2019-08-18 MED ORDER — PANTOPRAZOLE SODIUM 40 MG PO TBEC
40.0000 mg | DELAYED_RELEASE_TABLET | Freq: Every day | ORAL | Status: DC
  Administered 2019-08-18 – 2019-08-26 (×8): 40 mg via ORAL
  Filled 2019-08-18 (×8): qty 1

## 2019-08-18 MED ORDER — SERTRALINE HCL 25 MG PO TABS
12.5000 mg | ORAL_TABLET | Freq: Every day | ORAL | Status: DC | PRN
  Filled 2019-08-18: qty 0.5

## 2019-08-18 MED ORDER — VANCOMYCIN HCL IN DEXTROSE 1-5 GM/200ML-% IV SOLN: 1000.0000 mg | INTRAVENOUS | Status: DC

## 2019-08-18 MED ORDER — GADOBUTROL 1 MMOL/ML IV SOLN
10.0000 mL | Freq: Once | INTRAVENOUS | Status: AC | PRN
  Administered 2019-08-18: 10 mL via INTRAVENOUS

## 2019-08-18 MED ORDER — CHLORHEXIDINE GLUCONATE CLOTH 2 % EX PADS
6.0000 | MEDICATED_PAD | Freq: Every day | CUTANEOUS | Status: DC
  Administered 2019-08-18: 6 via TOPICAL

## 2019-08-18 NOTE — Anesthesia Preprocedure Evaluation (Addendum)
Anesthesia Evaluation  Patient identified by MRN, date of birth, ID band Patient awake    Reviewed: Allergy & Precautions, NPO status , Patient's Chart, lab work & pertinent test results  History of Anesthesia Complications Negative for: history of anesthetic complications  Airway Mallampati: II  TM Distance: >3 FB Neck ROM: Full    Dental no notable dental hx. (+) Dental Advisory Given   Pulmonary neg pulmonary ROS,    Pulmonary exam normal        Cardiovascular hypertension, Normal cardiovascular exam     Neuro/Psych PSYCHIATRIC DISORDERS Anxiety Depression    GI/Hepatic Neg liver ROS, GERD  Medicated,  Endo/Other  negative endocrine ROS  Renal/GU negative Renal ROS     Musculoskeletal  (+) Arthritis ,   Abdominal   Peds  Hematology   Anesthesia Other Findings   Reproductive/Obstetrics                            Anesthesia Physical Anesthesia Plan  ASA: III  Anesthesia Plan: General   Post-op Pain Management:    Induction: Intravenous  PONV Risk Score and Plan: 3 and Ondansetron, Dexamethasone and Treatment may vary due to age or medical condition  Airway Management Planned: Oral ETT  Additional Equipment: Arterial line and CVP  Intra-op Plan:   Post-operative Plan: Possible Post-op intubation/ventilation  Informed Consent: I have reviewed the patients History and Physical, chart, labs and discussed the procedure including the risks, benefits and alternatives for the proposed anesthesia with the patient or authorized representative who has indicated his/her understanding and acceptance.     Dental advisory given  Plan Discussed with: Anesthesiologist and CRNA  Anesthesia Plan Comments:        Anesthesia Quick Evaluation

## 2019-08-18 NOTE — ED Notes (Signed)
Called [facility] to Transfer Pt to [facility] ER.  Dr. Sedonia Small accepting.

## 2019-08-18 NOTE — ED Provider Notes (Signed)
Patient had a seizure yesterday and it was determined she had a mass in her brain.  She was accepted by Dr. Trenton Gammon.  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  I spoke with neurosurgery Dr. Reatha Armour and he agreed the patient should be sent over to [facility] soon as possible.  I have made arrangements to transfer the patient to the emergency department and Dr. Sedonia Small is excepting ED physician.  Initially Dr. Trenton Gammon was consulted and agreed with admission to the neurosurgery service.  The patient initially was not a hospitalist patient   Milton Ferguson, MD 08/18/19 1421

## 2019-08-18 NOTE — Progress Notes (Signed)
Report received from ED RN. Neurosurgery paged and notified that the patient had arrived from [facility].

## 2019-08-18 NOTE — ED Provider Notes (Signed)
Neurosurgery stated that they will be the admitting physician and either Dr. Trenton Gammon needs to be called when the patient arrives or Dr. Sherilyn Cooter, MD 08/18/19 1429

## 2019-08-18 NOTE — Procedures (Signed)
Patient Name: Tracie Wiggins  MRN: [id number]  Epilepsy Attending: Lora Havens  Referring Physician/Provider: Dr. Varney Biles Date: 08/18/2019 Duration: 23.10 minutes  Patient history: 74 year old female with seizure-like episodes.  EEG evaluate for seizures.  Level of alertness: Awake  AEDs during EEG study: Keppra  Technical aspects: This EEG study was done with scalp electrodes positioned according to the 10-20 International system of electrode placement. Electrical activity was acquired at a sampling rate of 500Hz  and reviewed with a high frequency filter of 70Hz  and a low frequency filter of 1Hz . EEG data were recorded continuously and digitally stored.   Description: The posterior dominant rhythm consists of 8 Hz activity of moderate voltage (25-35 uV) seen predominantly in posterior head regions, symmetric and reactive to eye opening and eye closing. Hyperventilation and photic stimulation were not performed.     IMPRESSION: This study is within normal limits. No seizures or epileptiform discharges were seen throughout the recording.  Ladonte Verstraete Barbra Sarks

## 2019-08-18 NOTE — H&P (Signed)
Tracie Wiggins is an 74 y.o. female.   Chief Complaint: Unsteadiness HPI: 74 year old female with history of headache, personality change, unsteadiness, frequent falls and possible frequent syncope versus seizure activity.  No history of malignancy.  Work-up does demonstrate evidence of a right-sided extra-axial posterior fossa mass with marked compression of the right cerebellum and some degree of obstructive hydrocephalus.  Past Medical History:  Diagnosis Date  . Allergy   . Anxiety   . Arthritis   . Cataract   . Depression   . GERD (gastroesophageal reflux disease)   . Headache   . Hyperlipidemia   . Hypertension   . Osteoporosis   . Shingles    twice    Past Surgical History:  Procedure Laterality Date  . ABDOMINAL HYSTERECTOMY    . APPENDECTOMY    . BIOPSY BREAST Right    Fluid filled cyst  . CATARACT EXTRACTION W/PHACO Left 07/20/2016   Procedure: CATARACT EXTRACTION PHACO AND INTRAOCULAR LENS PLACEMENT (IOC);  Surgeon: Baruch Goldmann, MD;  Location: [facility];  Service: Ophthalmology;  Laterality: Left;  CDE: 3.19  . CATARACT EXTRACTION W/PHACO Right 09/07/2016   Procedure: CATARACT EXTRACTION PHACO AND INTRAOCULAR LENS PLACEMENT (IOC);  Surgeon: Baruch Goldmann, MD;  Location: [facility];  Service: Ophthalmology;  Laterality: Right;  CDE: 5.04  . CHOLECYSTECTOMY    . NASAL SINUS SURGERY      Family History  Problem Relation Age of Onset  . COPD Mother   . Stroke Mother   . Heart disease Father   . Heart attack Father   . Heart disease Sister   . Diabetes Sister   . Heart attack Sister   . Stroke Sister   . Healthy Daughter    Social History:  reports that she has never smoked. She has never used smokeless tobacco. She reports that she does not drink alcohol and does not use drugs.  Allergies:  Allergies  Allergen Reactions  . Asa [Aspirin] Anaphylaxis, Nausea Only and Rash  . Codeine Anaphylaxis and Rash  . Erythromycin Anaphylaxis  . Penicillins Anaphylaxis  and Hives    Has patient had a PCN reaction causing immediate rash, facial/tongue/throat swelling, SOB or lightheadedness with hypotension: Yes Has patient had a PCN reaction causing severe rash involving mucus membranes or skin necrosis: No Has patient had a PCN reaction that required hospitalization: No Has patient had a PCN reaction occurring within the last 10 years: No If all of the above answers are "NO", then may proceed with Cephalosporin use.   . Niacin And Related Rash    Medications Prior to Admission  Medication Sig Dispense Refill  . omeprazole (PRILOSEC) 40 MG capsule Take 1 capsule (40 mg total) by mouth daily. 90 capsule 1  . sertraline (ZOLOFT) 25 MG tablet Take 0.5 tablets (12.5 mg total) by mouth daily as needed (for anxiety). Only takes 0.5 tablet 90 tablet 1    Results for orders placed or performed during the hospital encounter of 08/17/19 (from the past 48 hour(s))  CBG monitoring, ED     Status: Abnormal   Collection Time: 08/17/19  5:56 PM  Result Value Ref Range   Glucose-Capillary 128 (H) 70 - 99 mg/dL    Comment: Glucose reference range applies only to samples taken after fasting for at least 8 hours.  Basic metabolic panel     Status: Abnormal   Collection Time: 08/17/19  6:05 PM  Result Value Ref Range   Sodium 139 135 - 145 mmol/L  Potassium 3.9 3.5 - 5.1 mmol/L   Chloride 102 98 - 111 mmol/L   CO2 27 22 - 32 mmol/L   Glucose, Bld 116 (H) 70 - 99 mg/dL    Comment: Glucose reference range applies only to samples taken after fasting for at least 8 hours.   BUN 13 8 - 23 mg/dL   Creatinine, Ser 0.99 0.44 - 1.00 mg/dL   Calcium 9.3 8.9 - 10.3 mg/dL   GFR calc non Af Amer 56 (L) >60 mL/min   GFR calc Af Amer >60 >60 mL/min   Anion gap 10 5 - 15    Comment: Performed at [facility], [address] [place], [place] [address]  CBC WITH DIFFERENTIAL     Status: Abnormal   Collection Time: 08/17/19  6:05 PM  Result Value Ref Range   WBC 11.1 (H) 4.0 -  10.5 K/uL   RBC 4.54 3.87 - 5.11 MIL/uL   Hemoglobin 13.7 12.0 - 15.0 g/dL   HCT 43.6 36 - 46 %   MCV 96.0 80.0 - 100.0 fL   MCH 30.2 26.0 - 34.0 pg   MCHC 31.4 30.0 - 36.0 g/dL   RDW 14.2 11.5 - 15.5 %   Platelets 287 150 - 400 K/uL   nRBC 0.0 0.0 - 0.2 %   Neutrophils Relative % 81 %   Neutro Abs 8.8 (H) 1.7 - 7.7 K/uL   Lymphocytes Relative 14 %   Lymphs Abs 1.6 0.7 - 4.0 K/uL   Monocytes Relative 5 %   Monocytes Absolute 0.6 0 - 1 K/uL   Eosinophils Relative 0 %   Eosinophils Absolute 0.1 0 - 0 K/uL   Basophils Relative 0 %   Basophils Absolute 0.0 0 - 0 K/uL   Immature Granulocytes 0 %   Abs Immature Granulocytes 0.03 0.00 - 0.07 K/uL    Comment: Performed at [facility], [address], [place], [place] [address]  C-reactive protein     Status: None   Collection Time: 08/17/19  6:05 PM  Result Value Ref Range   CRP 0.6 <1.0 mg/dL    Comment: Performed at [facility], [address], [place], [place] [address]  Brain natriuretic peptide     Status: Abnormal   Collection Time: 08/17/19  6:05 PM  Result Value Ref Range   B Natriuretic Peptide 118.0 (H) 0.0 - 100.0 pg/mL    Comment: Performed at [facility], [address], [place], [place] [address]  Lipid panel     Status: Abnormal   Collection Time: 08/17/19  6:05 PM  Result Value Ref Range   Cholesterol 237 (H) 0 - 200 mg/dL   Triglycerides 99 <150 mg/dL   HDL 61 >40 mg/dL   Total CHOL/HDL Ratio 3.9 RATIO   VLDL 20 0 - 40 mg/dL   LDL Cholesterol 156 (H) 0 - 99 mg/dL    Comment:        Total Cholesterol/HDL:CHD Risk Coronary Heart Disease Risk Table                     Men   Women  1/2 Average Risk   3.4   3.3  Average Risk       5.0   4.4  2 X Average Risk   9.6   7.1  3 X Average Risk  23.4   11.0        Use the calculated Patient Ratio above and the CHD Risk Table to determine the  patient's CHD Risk.        ATP III CLASSIFICATION (LDL):  <100     mg/dL   Optimal  100-129  mg/dL   Near or Above                     Optimal  130-159  mg/dL   Borderline  160-189  mg/dL   High  >190     mg/dL   Very High Performed at [facility] [place], [place] [address]   Hemoglobin A1c     Status: None   Collection Time: 08/17/19  6:05 PM  Result Value Ref Range   Hgb A1c MFr Bld 5.4 4.8 - 5.6 %    Comment: (NOTE) Pre diabetes:          5.7%-6.4%  Diabetes:              >6.4%  Glycemic control for   <7.0% adults with diabetes    Mean Plasma Glucose 108.28 mg/dL    Comment: Performed at [facility] [address] [place], [place] [address]  SARS Coronavirus 2 by RT PCR (hospital order, performed in [facility] hospital lab) Nasopharyngeal Nasopharyngeal Swab     Status: None   Collection Time: 08/17/19 11:24 PM   Specimen: Nasopharyngeal Swab  Result Value Ref Range   SARS Coronavirus 2 NEGATIVE NEGATIVE    Comment: (NOTE) SARS-CoV-2 target nucleic acids are NOT DETECTED.  The SARS-CoV-2 RNA is generally detectable in upper and lower respiratory specimens during the acute phase of infection. The lowest concentration of SARS-CoV-2 viral copies this assay can detect is 250 copies / mL. A negative result does not preclude SARS-CoV-2 infection and should not be used as the sole basis for treatment or other patient management decisions.  A negative result may occur with improper specimen collection / handling, submission of specimen other than nasopharyngeal swab, presence of viral mutation(s) within the areas targeted by this assay, and inadequate number of viral copies (<250 copies / mL). A negative result must be combined with clinical observations, patient history, and epidemiological information.  Fact Sheet for Patients:   StrictlyIdeas.no  Fact Sheet for Healthcare Providers: BankingDealers.co.za  This test is not yet approved or  cleared by the [place] FDA and has been authorized for detection and/or  diagnosis of SARS-CoV-2 by FDA under an Emergency Use Authorization (EUA).  This EUA will remain in effect (meaning this test can be used) for the duration of the COVID-19 declaration under Section 564(b)(1) of the Act, 21 U.S.C. section 360bbb-3(b)(1), unless the authorization is terminated or revoked sooner.  Performed at [facility], [address], [place], [place] [address]   Sedimentation rate     Status: None   Collection Time: 08/17/19 11:58 PM  Result Value Ref Range   Sed Rate 9 0 - 22 mm/hr    Comment: Performed at [facility], [address] [place], [place] [address]  Troponin I (High Sensitivity)     Status: None   Collection Time: 08/17/19 11:58 PM  Result Value Ref Range   Troponin I (High Sensitivity) 5 <18 ng/L    Comment: (NOTE) Elevated high sensitivity troponin I (hsTnI) values and significant  changes across serial measurements may suggest ACS but many other  chronic and acute conditions are known to elevate hsTnI results.  Refer to the "Links" section for chest pain algorithms and additional  guidance. Performed at [facility], [address],  [place], [place] [address]   Troponin I (High Sensitivity)     Status: None   Collection Time: 08/18/19  1:38 AM  Result Value Ref Range   Troponin I (High Sensitivity) 6 <18 ng/L    Comment: (NOTE) Elevated high sensitivity troponin I (hsTnI) values and significant  changes across serial measurements may suggest ACS but many other  chronic and acute conditions are known to elevate hsTnI results.  Refer to the "Links" section for chest pain algorithms and additional  guidance. Performed at [facility], [address], [place], [place] [address]    EEG  Result Date: 08/18/2019 Lora Havens, MD     08/18/2019 11:50 AM Patient Name: TAELAR GRONEWOLD MRN: [id number] Epilepsy Attending: Lora Havens Referring Physician/Provider: Dr. Varney Biles Date: 08/18/2019 Duration: 23.10 minutes Patient history:  74 year old female with seizure-like episodes.  EEG evaluate for seizures. Level of alertness: Awake AEDs during EEG study: Keppra Technical aspects: This EEG study was done with scalp electrodes positioned according to the 10-20 International system of electrode placement. Electrical activity was acquired at a sampling rate of 500Hz  and reviewed with a high frequency filter of 70Hz  and a low frequency filter of 1Hz . EEG data were recorded continuously and digitally stored. Description: The posterior dominant rhythm consists of 8 Hz activity of moderate voltage (25-35 uV) seen predominantly in posterior head regions, symmetric and reactive to eye opening and eye closing. Hyperventilation and photic stimulation were not performed.   IMPRESSION: This study is within normal limits. No seizures or epileptiform discharges were seen throughout the recording. Lora Havens   CT Head Wo Contrast  Result Date: 08/17/2019 CLINICAL DATA:  Seizure. EXAM: CT HEAD WITHOUT CONTRAST TECHNIQUE: Contiguous axial images were obtained from the base of the skull through the vertex without intravenous contrast. COMPARISON:  None. FINDINGS: Brain: There is mild cerebral atrophy with widening of the extra-axial spaces and ventricular dilatation. There are areas of decreased attenuation within the white matter tracts of the supratentorial brain, consistent with microvascular disease changes. A 4.6 cm x 3.8 cm well-defined isodense mass, with thin surrounding hyperdense rim, is seen within the cerebellum on the right. There is a mild amount of medial vasogenic edema with mass effect on the fourth ventricle. Approximately 1.1 cm right to left midline shift is noted. Vascular: No hyperdense vessel or unexpected calcification. Skull: Normal. Negative for fracture or focal lesion. Sinuses/Orbits: No acute finding. Other: None. IMPRESSION: 4.6 cm x 3.8 cm well-defined isodense mass, with thin surrounding hyperdense rim, within the  cerebellum on the right with associated mass effect and 1.1 cm right to left midline shift. MRI correlation is recommended. Electronically Signed   By: Virgina Norfolk M.D.   On: 08/17/2019 19:05   MR Brain W and Wo Contrast  Result Date: 08/18/2019 CLINICAL DATA:  74 year old female with seizure and posterior fossa mass on head CT yesterday. EXAM: MRI HEAD WITHOUT AND WITH CONTRAST TECHNIQUE: Multiplanar, multiecho pulse sequences of the brain and surrounding structures were obtained without and with intravenous contrast. CONTRAST:  52mL GADAVIST GADOBUTROL 1 MMOL/ML IV SOLN COMPARISON:  Head CT 08/17/2019. FINDINGS: Brain: Large round up to 49 mm diameter homogeneously enhancing mass in the right posterior fossa appears to be extra-axial (series 10, image 7) with a broad dural attachment, including a portion of the mass inseparable from the patent right sigmoid sinus (series 15, image 20, series 16, image 8). Associated posterior fossa mass effect including mild cerebellar tonsillar ectopia and leftward midline  shift of the cerebellum with moderate effacement of the 4th ventricle. Only mild associated edema in the central cerebellum. But there is superimposed supratentorial ventriculomegaly with evidence of at least mild transependymal edema (series 12, image 16). No supratentorial midline shift. The suprasellar cistern is patent. No other abnormal intracranial enhancement. No other dural thickening. No superimposed restricted diffusion to suggest acute infarction. No acute intracranial hemorrhage. Pituitary within normal limits. No cortical encephalomalacia. No chronic cerebral blood products identified. Vascular: Major intracranial vascular flow voids are preserved. The major dural venous sinuses are enhancing and appear to be patent. Skull and upper cervical spine: Negative visible cervical spine. Visualized bone marrow signal is within normal limits. Sinuses/Orbits: Postoperative changes to both globes,  negative orbits. Paranasal Visualized paranasal sinuses and mastoids are stable and well pneumatized. Other: Negative scalp and face. IMPRESSION: 1. 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 Substantial posterior fossa mass effect and subsequent hydrocephalus due to subtotal compression of the 4th ventricle. Recommend Neurosurgery consultation. 2. The mass is inseparable from the right sigmoid sinus which remains patent. Electronically Signed   By: Genevie Ann M.D.   On: 08/18/2019 13:45   DG Chest Port 1 View  Result Date: 08/18/2019 CLINICAL DATA:  Brain tumor, seizure, rule out aspiration EXAM: PORTABLE CHEST 1 VIEW COMPARISON:  Radiograph 04/29/2019 FINDINGS: There are some chronically coarsened interstitial changes, albeit with superimposed basilar airways and retrocardiac opacity, slightly increased from prior which in the appropriate clinical setting could reflect sequela of aspiration. No pneumothorax or visible effusion. The aorta is calcified. The remaining cardiomediastinal contours are unremarkable. No acute osseous or soft tissue abnormality. Degenerative changes are present in the imaged spine and shoulders. Telemetry leads overlie the chest. IMPRESSION: New basilar airways and retrocardiac opacity, in the appropriate clinical setting could reflect sequela of aspiration. Additional chronically coarsened interstitial changes seen in the lung bases. Electronically Signed   By: Lovena Le M.D.   On: 08/18/2019 00:35    Pertinent items noted in HPI and remainder of comprehensive ROS otherwise negative.  Blood pressure (!) 144/82, pulse 71, temperature 98.2 F (36.8 C), temperature source Oral, resp. rate 13, height 5\' 2"  (1.575 m), weight 90.8 kg, SpO2 97 %.  Awake and alert.  She is mildly confused.  She is oriented to person place and time.  Cranial nerve function intact bilateral.  Motor and sensory function lower extremities normal.  Deep tendon reflexes normal  active.  No evidence of long track signs.  Examination head ears eyes nose throat [place] chest and abdomen are benign.  Extremities are free from deformity. Assessment/Plan Right posterior fossa extra-axial mass consistent with posterior fossa meningioma with marked mass-effect and associated early obstructive hydrocephalus.  I discussed situation with patient and her daughter.  Recommend we move forward urgently with a right-sided suboccipital craniectomy with resection of tumor.  I discussed the risks involved with surgery including but not limited to risk of anesthesia, bleeding, infection, CSF leak, brain injury, including stroke, and even death.  I discussed the risk of tumor recurrence and partial resection.  The patient and her daughter have been given the option as questions.  They appear to understand.  They wish to proceed with surgery.  Plan for surgery first thing in the morning  Charlie Pitter 08/18/2019, 6:14 PM

## 2019-08-18 NOTE — ED Notes (Signed)
Pt returned from MRI °

## 2019-08-18 NOTE — ED Notes (Signed)
Pt transported to [facility] by [place].

## 2019-08-18 NOTE — Progress Notes (Signed)
Patient arrived from ED with cell phone and clothing at her side. She is AAOx4. Admission completed. Daughter notified of her arrival. Waiting on NSG to assess patient before dinner is ordered. Oreoluwa Gilmer, Rande Brunt, RN

## 2019-08-18 NOTE — Discharge Instructions (Signed)
Transfer to [facility] emergency department Dr. Sedonia Small accepting

## 2019-08-18 NOTE — Progress Notes (Signed)
EEG complete - results pending 

## 2019-08-19 ENCOUNTER — Encounter (HOSPITAL_COMMUNITY)
Admission: EM | Disposition: A | Discharge status: Skilled Nursing Facility | Payer: Self-pay | Source: Home / Self Care | Attending: Neurosurgery

## 2019-08-19 ENCOUNTER — Inpatient Hospital Stay (HOSPITAL_COMMUNITY)
Admission: EM | Disposition: A | Discharge status: Skilled Nursing Facility | Payer: Self-pay | Source: Home / Self Care | Attending: Neurosurgery

## 2019-08-19 ENCOUNTER — Encounter (HOSPITAL_COMMUNITY): Payer: Self-pay | Admitting: Neurosurgery

## 2019-08-19 ENCOUNTER — Other Ambulatory Visit: Payer: Self-pay

## 2019-08-19 ENCOUNTER — Inpatient Hospital Stay (HOSPITAL_COMMUNITY): Payer: Medicare HMO | Admitting: Anesthesiology

## 2019-08-19 ENCOUNTER — Telehealth: Payer: Self-pay | Admitting: Nurse Practitioner

## 2019-08-19 HISTORY — PX: CRANIOTOMY: SHX93

## 2019-08-19 LAB — BASIC METABOLIC PANEL
Anion gap: 10 (ref 5–15)
BUN: 15 mg/dL (ref 8–23)
CO2: 21 mmol/L — ABNORMAL LOW (ref 22–32)
Calcium: 8.1 mg/dL — ABNORMAL LOW (ref 8.9–10.3)
Chloride: 106 mmol/L (ref 98–111)
Creatinine, Ser: 0.86 mg/dL (ref 0.44–1.00)
GFR calc Af Amer: >60 mL/min (ref >60)
GFR calc non Af Amer: >60 mL/min (ref >60)
Glucose, Bld: 178 mg/dL — ABNORMAL HIGH (ref 70–99)
Potassium: 4 mmol/L (ref 3.5–5.1)
Sodium: 137 mmol/L (ref 135–145)

## 2019-08-19 LAB — CBC
HCT: 30.4 % — ABNORMAL LOW (ref 36.0–46.0)
Hemoglobin: 9.5 g/dL — ABNORMAL LOW (ref 12.0–15.0)
MCH: 30.2 pg (ref 26.0–34.0)
MCHC: 31.3 g/dL (ref 30.0–36.0)
MCV: 96.5 fL (ref 80.0–100.0)
Platelets: 302 10*3/uL (ref 150–400)
RBC: 3.15 MIL/uL — ABNORMAL LOW (ref 3.87–5.11)
RDW: 14.5 % (ref 11.5–15.5)
WBC: 13.3 10*3/uL — ABNORMAL HIGH (ref 4.0–10.5)
nRBC: 0 % (ref 0.0–0.2)

## 2019-08-19 LAB — GLUCOSE, CAPILLARY: Glucose-Capillary: 138 mg/dL — ABNORMAL HIGH (ref 70–99)

## 2019-08-19 LAB — PREPARE RBC (CROSSMATCH)

## 2019-08-19 LAB — ABO/RH: ABO/RH(D): O POS

## 2019-08-19 SURGERY — CRANIOTOMY TUMOR EXCISION
Anesthesia: General | Laterality: Right

## 2019-08-19 MED ORDER — FENTANYL CITRATE (PF) 100 MCG/2ML IJ SOLN: 25.0000 ug | INTRAMUSCULAR | Status: DC | PRN

## 2019-08-19 MED ORDER — PROMETHAZINE HCL 25 MG/ML IJ SOLN: 6.2500 mg | INTRAMUSCULAR | Status: DC | PRN

## 2019-08-19 MED ORDER — HEMOSTATIC AGENTS (NO CHARGE) OPTIME
TOPICAL | Status: DC | PRN
  Administered 2019-08-19 (×3): 1 via TOPICAL

## 2019-08-19 MED ORDER — NALOXONE HCL 0.4 MG/ML IJ SOLN: 0.0800 mg | INTRAMUSCULAR | Status: DC | PRN

## 2019-08-19 MED ORDER — FENTANYL CITRATE (PF) 250 MCG/5ML IJ SOLN
INTRAMUSCULAR | Status: AC
  Filled 2019-08-19: qty 5

## 2019-08-19 MED ORDER — FENTANYL CITRATE (PF) 100 MCG/2ML IJ SOLN
INTRAMUSCULAR | Status: AC
  Filled 2019-08-19: qty 2

## 2019-08-19 MED ORDER — THROMBIN 5000 UNITS EX SOLR
CUTANEOUS | Status: AC
  Filled 2019-08-19: qty 5000

## 2019-08-19 MED ORDER — PHENYLEPHRINE 40 MCG/ML (10ML) SYRINGE FOR IV PUSH (FOR BLOOD PRESSURE SUPPORT)
PREFILLED_SYRINGE | INTRAVENOUS | Status: DC | PRN
  Administered 2019-08-19: 80 ug via INTRAVENOUS

## 2019-08-19 MED ORDER — ONDANSETRON HCL 4 MG/2ML IJ SOLN
INTRAMUSCULAR | Status: AC
  Filled 2019-08-19: qty 2

## 2019-08-19 MED ORDER — SODIUM CHLORIDE 0.9 % IV SOLN: INTRAVENOUS | Status: DC

## 2019-08-19 MED ORDER — THROMBIN 20000 UNITS EX SOLR: CUTANEOUS | Status: DC | PRN

## 2019-08-19 MED ORDER — ESMOLOL HCL 100 MG/10ML IV SOLN
INTRAVENOUS | Status: AC
  Filled 2019-08-19: qty 10

## 2019-08-19 MED ORDER — BACITRACIN ZINC 500 UNIT/GM EX OINT
TOPICAL_OINTMENT | CUTANEOUS | Status: DC | PRN
  Administered 2019-08-19: 1 via TOPICAL

## 2019-08-19 MED ORDER — DEXAMETHASONE SODIUM PHOSPHATE 10 MG/ML IJ SOLN
INTRAMUSCULAR | Status: AC
  Filled 2019-08-19: qty 1

## 2019-08-19 MED ORDER — PHENYLEPHRINE 40 MCG/ML (10ML) SYRINGE FOR IV PUSH (FOR BLOOD PRESSURE SUPPORT)
PREFILLED_SYRINGE | INTRAVENOUS | Status: AC
  Filled 2019-08-19: qty 10

## 2019-08-19 MED ORDER — VANCOMYCIN HCL IN DEXTROSE 1-5 GM/200ML-% IV SOLN
1000.0000 mg | Freq: Once | INTRAVENOUS | Status: AC
  Administered 2019-08-19: 1000 mg via INTRAVENOUS
  Filled 2019-08-19: qty 200

## 2019-08-19 MED ORDER — ROCURONIUM BROMIDE 10 MG/ML (PF) SYRINGE
PREFILLED_SYRINGE | INTRAVENOUS | Status: AC
  Filled 2019-08-19: qty 10

## 2019-08-19 MED ORDER — ROCURONIUM BROMIDE 10 MG/ML (PF) SYRINGE
PREFILLED_SYRINGE | INTRAVENOUS | Status: DC | PRN
  Administered 2019-08-19: 10 mg via INTRAVENOUS
  Administered 2019-08-19: 80 mg via INTRAVENOUS

## 2019-08-19 MED ORDER — SODIUM CHLORIDE 0.9 % IR SOLN
Status: DC | PRN
  Administered 2019-08-19: 3000 mL

## 2019-08-19 MED ORDER — SODIUM CHLORIDE 0.9% IV SOLUTION: Freq: Once | INTRAVENOUS | Status: DC

## 2019-08-19 MED ORDER — VANCOMYCIN HCL IN DEXTROSE 1-5 GM/200ML-% IV SOLN
1000.0000 mg | INTRAVENOUS | Status: AC
  Administered 2019-08-19: 1000 mg via INTRAVENOUS
  Filled 2019-08-19: qty 200

## 2019-08-19 MED ORDER — FENTANYL CITRATE (PF) 100 MCG/2ML IJ SOLN
INTRAMUSCULAR | Status: DC | PRN
  Administered 2019-08-19 (×3): 50 ug via INTRAVENOUS
  Administered 2019-08-19: 100 ug via INTRAVENOUS
  Administered 2019-08-19: 50 ug via INTRAVENOUS

## 2019-08-19 MED ORDER — SODIUM CHLORIDE 0.9 % IV SOLN
INTRAVENOUS | Status: DC | PRN
Start: 2019-08-19 — End: 2019-08-19

## 2019-08-19 MED ORDER — LEVETIRACETAM IN NACL 500 MG/100ML IV SOLN
500.0000 mg | Freq: Two times a day (BID) | INTRAVENOUS | Status: DC
  Administered 2019-08-19 – 2019-08-21 (×4): 500 mg via INTRAVENOUS
  Filled 2019-08-19 (×2): qty 100

## 2019-08-19 MED ORDER — DEXAMETHASONE SODIUM PHOSPHATE 10 MG/ML IJ SOLN
10.0000 mg | Freq: Once | INTRAMUSCULAR | Status: AC
  Administered 2019-08-19: 10 mg via INTRAVENOUS
  Filled 2019-08-19: qty 1

## 2019-08-19 MED ORDER — PROPOFOL 10 MG/ML IV BOLUS
INTRAVENOUS | Status: DC | PRN
  Administered 2019-08-19: 50 mg via INTRAVENOUS
  Administered 2019-08-19: 170 mg via INTRAVENOUS
  Administered 2019-08-19: 80 mg via INTRAVENOUS

## 2019-08-19 MED ORDER — ESMOLOL HCL 100 MG/10ML IV SOLN
INTRAVENOUS | Status: DC | PRN
  Administered 2019-08-19: 30 mg via INTRAVENOUS
  Administered 2019-08-19: 20 mg via INTRAVENOUS

## 2019-08-19 MED ORDER — ACETAMINOPHEN 650 MG RE SUPP: 650.0000 mg | RECTAL | Status: DC | PRN

## 2019-08-19 MED ORDER — PROMETHAZINE HCL 25 MG PO TABS: 12.5000 mg | ORAL_TABLET | ORAL | Status: DC | PRN

## 2019-08-19 MED ORDER — SODIUM CHLORIDE 0.9 % IV SOLN: INTRAVENOUS | Status: DC | PRN

## 2019-08-19 MED ORDER — CHLORHEXIDINE GLUCONATE CLOTH 2 % EX PADS
6.0000 | MEDICATED_PAD | Freq: Once | CUTANEOUS | Status: AC
  Administered 2019-08-19: 6 via TOPICAL

## 2019-08-19 MED ORDER — PROPOFOL 10 MG/ML IV BOLUS
INTRAVENOUS | Status: AC
  Filled 2019-08-19: qty 20

## 2019-08-19 MED ORDER — MANNITOL 25 % IV SOLN
INTRAVENOUS | Status: DC | PRN
  Administered 2019-08-19: 10 g via INTRAVENOUS
  Administered 2019-08-19 (×2): 25 g via INTRAVENOUS

## 2019-08-19 MED ORDER — DEXAMETHASONE SODIUM PHOSPHATE 10 MG/ML IJ SOLN
INTRAMUSCULAR | Status: DC | PRN
  Administered 2019-08-19: 10 mg via INTRAVENOUS

## 2019-08-19 MED ORDER — LABETALOL HCL 5 MG/ML IV SOLN: 10.0000 mg | INTRAVENOUS | Status: DC | PRN

## 2019-08-19 MED ORDER — DEXAMETHASONE SODIUM PHOSPHATE 4 MG/ML IJ SOLN
4.0000 mg | Freq: Three times a day (TID) | INTRAMUSCULAR | Status: DC
  Administered 2019-08-21: 4 mg via INTRAVENOUS
  Filled 2019-08-19: qty 1

## 2019-08-19 MED ORDER — BACITRACIN ZINC 500 UNIT/GM EX OINT
TOPICAL_OINTMENT | CUTANEOUS | Status: AC
  Filled 2019-08-19: qty 28.35

## 2019-08-19 MED ORDER — HYDROCODONE-ACETAMINOPHEN 5-325 MG PO TABS
1.0000 | ORAL_TABLET | ORAL | Status: DC | PRN
  Administered 2019-08-19 – 2019-08-25 (×8): 1 via ORAL
  Filled 2019-08-19 (×8): qty 1

## 2019-08-19 MED ORDER — SUGAMMADEX SODIUM 200 MG/2ML IV SOLN
INTRAVENOUS | Status: DC | PRN
  Administered 2019-08-19: 200 mg via INTRAVENOUS

## 2019-08-19 MED ORDER — ALBUMIN HUMAN 5 % IV SOLN
INTRAVENOUS | Status: DC | PRN
Start: 2019-08-19 — End: 2019-08-19

## 2019-08-19 MED ORDER — ONDANSETRON HCL 4 MG/2ML IJ SOLN
4.0000 mg | INTRAMUSCULAR | Status: DC | PRN
  Administered 2019-08-19: 4 mg via INTRAVENOUS
  Filled 2019-08-19: qty 2

## 2019-08-19 MED ORDER — LIDOCAINE-EPINEPHRINE 1 %-1:100000 IJ SOLN
INTRAMUSCULAR | Status: DC | PRN
  Administered 2019-08-19: 10 mL

## 2019-08-19 MED ORDER — LIDOCAINE-EPINEPHRINE 1 %-1:100000 IJ SOLN
INTRAMUSCULAR | Status: AC
  Filled 2019-08-19: qty 1

## 2019-08-19 MED ORDER — LIDOCAINE 2% (20 MG/ML) 5 ML SYRINGE
INTRAMUSCULAR | Status: AC
  Filled 2019-08-19: qty 5

## 2019-08-19 MED ORDER — THROMBIN 20000 UNITS EX SOLR
CUTANEOUS | Status: AC
  Filled 2019-08-19: qty 20000

## 2019-08-19 MED ORDER — ONDANSETRON HCL 4 MG PO TABS: 4.0000 mg | ORAL_TABLET | ORAL | Status: DC | PRN

## 2019-08-19 MED ORDER — HYDROMORPHONE HCL 1 MG/ML IJ SOLN
0.5000 mg | INTRAMUSCULAR | Status: DC | PRN
  Administered 2019-08-19: 1 mg via INTRAVENOUS
  Filled 2019-08-19: qty 1

## 2019-08-19 MED ORDER — ONDANSETRON HCL 4 MG/2ML IJ SOLN
INTRAMUSCULAR | Status: DC | PRN
  Administered 2019-08-19: 4 mg via INTRAVENOUS

## 2019-08-19 MED ORDER — GLYCOPYRROLATE PF 0.2 MG/ML IJ SOSY
PREFILLED_SYRINGE | INTRAMUSCULAR | Status: AC
  Filled 2019-08-19: qty 1

## 2019-08-19 MED ORDER — CLEVIDIPINE BUTYRATE 0.5 MG/ML IV EMUL
INTRAVENOUS | Status: AC
  Filled 2019-08-19: qty 50

## 2019-08-19 MED ORDER — CLEVIDIPINE BUTYRATE 0.5 MG/ML IV EMUL
0.0000 mg/h | INTRAVENOUS | Status: DC
  Administered 2019-08-19 (×2): 10 mg/h via INTRAVENOUS
  Administered 2019-08-19: 1 mg/h via INTRAVENOUS
  Filled 2019-08-19 (×4): qty 50

## 2019-08-19 MED ORDER — LIDOCAINE 2% (20 MG/ML) 5 ML SYRINGE
INTRAMUSCULAR | Status: DC | PRN
  Administered 2019-08-19: 100 mg via INTRAVENOUS

## 2019-08-19 MED ORDER — CHLORHEXIDINE GLUCONATE CLOTH 2 % EX PADS
6.0000 | MEDICATED_PAD | Freq: Every day | CUTANEOUS | Status: DC
  Administered 2019-08-20 – 2019-08-26 (×5): 6 via TOPICAL

## 2019-08-19 MED ORDER — LACTATED RINGERS IV SOLN: INTRAVENOUS | Status: DC | PRN

## 2019-08-19 MED ORDER — THROMBIN 5000 UNITS EX SOLR: OROMUCOSAL | Status: DC | PRN

## 2019-08-19 MED ORDER — ACETAMINOPHEN 325 MG PO TABS
650.0000 mg | ORAL_TABLET | ORAL | Status: DC | PRN
  Administered 2019-08-21: 650 mg via ORAL
  Filled 2019-08-19: qty 2

## 2019-08-19 MED ORDER — CLEVIDIPINE BUTYRATE 0.5 MG/ML IV EMUL
INTRAVENOUS | Status: DC | PRN
  Administered 2019-08-19: 2 mg/h via INTRAVENOUS

## 2019-08-19 MED ORDER — DEXAMETHASONE SODIUM PHOSPHATE 4 MG/ML IJ SOLN
4.0000 mg | Freq: Four times a day (QID) | INTRAMUSCULAR | Status: AC
  Administered 2019-08-20 – 2019-08-21 (×4): 4 mg via INTRAVENOUS
  Filled 2019-08-19 (×4): qty 1

## 2019-08-19 MED ORDER — PHENYLEPHRINE HCL-NACL 10-0.9 MG/250ML-% IV SOLN
INTRAVENOUS | Status: DC | PRN
  Administered 2019-08-19: 60 ug/min via INTRAVENOUS

## 2019-08-19 MED ORDER — CHLORHEXIDINE GLUCONATE CLOTH 2 % EX PADS: 6.0000 | MEDICATED_PAD | Freq: Once | CUTANEOUS | Status: AC

## 2019-08-19 MED ORDER — DEXAMETHASONE SODIUM PHOSPHATE 10 MG/ML IJ SOLN
6.0000 mg | Freq: Four times a day (QID) | INTRAMUSCULAR | Status: AC
  Administered 2019-08-19 – 2019-08-20 (×4): 6 mg via INTRAVENOUS
  Filled 2019-08-19 (×4): qty 1

## 2019-08-19 SURGICAL SUPPLY — 46 items
BAG DECANTER FOR FLEXI CONT (MISCELLANEOUS) ×2 IMPLANT
BLADE CLIPPER SURG (BLADE) ×2 IMPLANT
BUR ACORN 6.0 PRECISION (BURR) ×2 IMPLANT
BUR SPIRAL ROUTER 2.3 (BUR) ×2 IMPLANT
CANISTER SUCT 3000ML PPV (MISCELLANEOUS) ×6 IMPLANT
CARTRIDGE OIL MAESTRO DRILL (MISCELLANEOUS) ×1 IMPLANT
CNTNR URN SCR LID CUP LEK RST (MISCELLANEOUS) ×2 IMPLANT
CONT SPEC 4OZ STRL OR WHT (MISCELLANEOUS) ×4
DIFFUSER DRILL AIR PNEUMATIC (MISCELLANEOUS) ×2 IMPLANT
DRAPE NEUROLOGICAL W/INCISE (DRAPES) ×2 IMPLANT
DRAPE WARM FLUID 44X44 (DRAPES) ×2 IMPLANT
DURASEAL APPLICATOR TIP (TIP) ×2 IMPLANT
DURASEAL SPINE SEALANT 3ML (MISCELLANEOUS) ×2 IMPLANT
ELECT REM PT RETURN 9FT ADLT (ELECTROSURGICAL) ×2
ELECTRODE REM PT RTRN 9FT ADLT (ELECTROSURGICAL) ×1 IMPLANT
GAUZE SPONGE 4X4 12PLY STRL LF (GAUZE/BANDAGES/DRESSINGS) ×2 IMPLANT
GLOVE ECLIPSE 9.0 STRL (GLOVE) ×4 IMPLANT
GOWN STRL REUS W/ TWL LRG LVL3 (GOWN DISPOSABLE) ×1 IMPLANT
GOWN STRL REUS W/ TWL XL LVL3 (GOWN DISPOSABLE) ×1 IMPLANT
GOWN STRL REUS W/TWL 2XL LVL3 (GOWN DISPOSABLE) ×4 IMPLANT
GOWN STRL REUS W/TWL LRG LVL3 (GOWN DISPOSABLE) ×2
GOWN STRL REUS W/TWL XL LVL3 (GOWN DISPOSABLE) ×2
GRAFT DURAGEN MATRIX 2WX2L ×2 IMPLANT
HEMOSTAT POWDER KIT SURGIFOAM (HEMOSTASIS) ×2 IMPLANT
HEMOSTAT SURGICEL 2X14 (HEMOSTASIS) ×2 IMPLANT
KIT BASIN OR (CUSTOM PROCEDURE TRAY) ×2 IMPLANT
KIT TURNOVER KIT B (KITS) ×2 IMPLANT
MARKER PEN SURG W/LABELS BLK (STERILIZATION PRODUCTS) ×2 IMPLANT
MESH DYNAMIC MALL MED 1.5 (Mesh General) ×2 IMPLANT
NEEDLE HYPO 25X1 1.5 SAFETY (NEEDLE) ×2 IMPLANT
NS IRRIG 1000ML POUR BTL (IV SOLUTION) ×6 IMPLANT
OIL CARTRIDGE MAESTRO DRILL (MISCELLANEOUS) ×2
PACK CRANIOTOMY CUSTOM (CUSTOM PROCEDURE TRAY) ×2 IMPLANT
PATTIES SURGICAL 1X1 (DISPOSABLE) ×2 IMPLANT
SCREW UNIII AXS SD 1.5X4 (Screw) ×18 IMPLANT
SPONGE SURGIFOAM ABS GEL 100 (HEMOSTASIS) ×2 IMPLANT
SPONGE SURGIFOAM ABS GEL SZ50 (HEMOSTASIS) ×2 IMPLANT
STAPLER VISISTAT 35W (STAPLE) ×2 IMPLANT
SUT NURALON 4 0 TR CR/8 (SUTURE) ×4 IMPLANT
SUT VIC AB 2-0 CT2 18 VCP726D (SUTURE) ×4 IMPLANT
SYR CONTROL 10ML LL (SYRINGE) ×2 IMPLANT
TAPE CLOTH SURG 4X10 WHT LF (GAUZE/BANDAGES/DRESSINGS) ×2 IMPLANT
TOWEL GREEN STERILE (TOWEL DISPOSABLE) ×2 IMPLANT
TOWEL GREEN STERILE FF (TOWEL DISPOSABLE) ×2 IMPLANT
TRAY FOLEY MTR SLVR 16FR STAT (SET/KITS/TRAYS/PACK) ×2 IMPLANT
WATER STERILE IRR 1000ML POUR (IV SOLUTION) ×2 IMPLANT

## 2019-08-19 NOTE — Transfer of Care (Signed)
Immediate Anesthesia Transfer of Care Note  Patient: Tracie Wiggins  Procedure(s) Performed: RIGHT CRANIOTOMY TUMOR EXCISION (Right )  Patient Location: PACU  Anesthesia Type:General  Level of Consciousness: awake and patient cooperative  Airway & Oxygen Therapy: Patient Spontanous Breathing and Patient connected to nasal cannula oxygen  Post-op Assessment: Report given to RN and Post -op Vital signs reviewed and stable  Post vital signs: Reviewed and stable  Last Vitals:  Vitals Value Taken Time  BP 154/98 08/19/19 1136  Temp    Pulse 101 08/19/19 1137  Resp 17 08/19/19 1137  SpO2 100 % 08/19/19 1137  Vitals shown include unvalidated device data.  Last Pain:  Vitals:   08/19/19 0730  TempSrc:   PainSc: 0-No pain         Complications: No complications documented.

## 2019-08-19 NOTE — Progress Notes (Signed)
Pharmacy Antibiotic Note  Tracie Wiggins is a 74 y.o. female s/p R craniotomy tumor excision. Pharmacy consulted for one dose of vancomycin post op. No vancomycin allergies noted.   Plan: -Will schedule one dose of vancomycin 1 gm IV this evening and sign off     Thank you for allowing pharmacy to be a part of this patients care.  Albertina Parr, PharmD., BCPS, BCCCP Clinical Pharmacist Clinical phone for 08/19/19 until 3:30pm: [phone] If after 3:30pm, please refer to [facility] for unit-specific pharmacist

## 2019-08-19 NOTE — Brief Op Note (Signed)
08/17/2019 - 08/19/2019  11:22 AM  PATIENT:  [name]  74 y.o. female  PRE-OPERATIVE DIAGNOSIS:  Tumor  POST-OPERATIVE DIAGNOSIS:  * No post-op diagnosis entered *  PROCEDURE:  Procedure(s): RIGHT CRANIOTOMY TUMOR EXCISION (Right)  SURGEON:  Surgeon(s) and Role:    * Earnie Larsson, MD - Primary  PHYSICIAN ASSISTANT:   ASSISTANTSMearl Latin   ANESTHESIA:   general  EBL:  1250 mL   BLOOD ADMINISTERED:none  DRAINS: none   LOCAL MEDICATIONS USED:  LIDOCAINE   SPECIMEN:  Source of Specimen:  Right posterior fossa mass  DISPOSITION OF SPECIMEN:  PATHOLOGY  COUNTS:  YES  TOURNIQUET:  * No tourniquets in log *  DICTATION: .Dragon Dictation  PLAN OF CARE: Admit to inpatient   PATIENT DISPOSITION:  PACU - hemodynamically stable.   Delay start of Pharmacological VTE agent (>24hrs) due to surgical blood loss or risk of bleeding: yes

## 2019-08-19 NOTE — Anesthesia Procedure Notes (Signed)
Procedure Name: Intubation Date/Time: 08/19/2019 8:36 AM Performed by: Orlie Dakin, CRNA Pre-anesthesia Checklist: Patient identified, Emergency Drugs available, Suction available and Patient being monitored Patient Re-evaluated:Patient Re-evaluated prior to induction Oxygen Delivery Method: Circle system utilized Preoxygenation: Pre-oxygenation with 100% oxygen Induction Type: IV induction Ventilation: Mask ventilation without difficulty Laryngoscope Size: Miller and 3 Grade View: Grade I Tube type: Oral Tube size: 7.0 mm Number of attempts: 1 Airway Equipment and Method: Stylet Placement Confirmation: ETT inserted through vocal cords under direct vision,  positive ETCO2 and breath sounds checked- equal and bilateral Secured at: 23 cm Tube secured with: Tape Dental Injury: Teeth and Oropharynx as per pre-operative assessment  Comments: 4x4s bite block used at end of proc.

## 2019-08-19 NOTE — Anesthesia Procedure Notes (Addendum)
Central Venous Catheter Insertion Performed by: Duane Boston, MD, anesthesiologist Start/End8/18/2021 7:51 AM, 08/19/2019 8:01 AM Patient location: Pre-op. Preanesthetic checklist: patient identified, IV checked, site marked, risks and benefits discussed, surgical consent, monitors and equipment checked, pre-op evaluation, timeout performed and anesthesia consent Position: Trendelenburg Lidocaine 1% used for infiltration and patient sedated Hand hygiene performed , maximum sterile barriers used  and Seldinger technique used Catheter size: 8 Fr Total catheter length 16. Central line was placed.Double lumen Procedure performed using ultrasound guided technique. Ultrasound Notes:image(s) printed for medical record Attempts: 1 Following insertion, dressing applied, line sutured and Biopatch. Post procedure assessment: blood return through all ports, free fluid flow and no air  Patient tolerated the procedure well with no immediate complications.

## 2019-08-19 NOTE — Interval H&P Note (Signed)
History and Physical Interval Note:  08/19/2019 8:02 AM  Tracie Wiggins  has presented today for surgery, with the diagnosis of Tumor.  The various methods of treatment have been discussed with the patient and family. After consideration of risks, benefits and other options for treatment, the patient has consented to  Procedure(s): RIGHT CRANIOTOMY TUMOR EXCISION (Right) as a surgical intervention.  The patient's history has been reviewed, patient examined, no change in status, stable for surgery.  I have reviewed the patient's chart and labs.  Questions were answered to the patient's satisfaction.     Cooper Render Merlene Dante

## 2019-08-19 NOTE — Anesthesia Procedure Notes (Signed)
Arterial Line Insertion Start/End8/18/2021 8:01 AM, 08/19/2019 8:01 AM Performed by: Duane Boston, MD, anesthesiologist  Preanesthetic checklist: patient identified, IV checked, site marked, risks and benefits discussed, surgical consent, monitors and equipment checked, pre-op evaluation and timeout performed Lidocaine 1% used for infiltration and patient sedated Left, brachial was placed Catheter size: 20 G Hand hygiene performed , maximum sterile barriers used  and Seldinger technique used  Attempts: 1 Procedure performed using ultrasound guided technique. Ultrasound Notes:anatomy identified, needle tip was noted to be adjacent to the nerve/plexus identified, no ultrasound evidence of intravascular and/or intraneural injection and image(s) printed for medical record Following insertion, dressing applied and Biopatch. Post procedure assessment: normal  Patient tolerated the procedure well with no immediate complications.

## 2019-08-19 NOTE — Anesthesia Postprocedure Evaluation (Signed)
Anesthesia Post Note  Patient: Tracie Wiggins  Procedure(s) Performed: RIGHT CRANIOTOMY TUMOR EXCISION (Right )     Patient location during evaluation: PACU Anesthesia Type: General Level of consciousness: sedated Pain management: pain level controlled Vital Signs Assessment: post-procedure vital signs reviewed and stable Respiratory status: spontaneous breathing and respiratory function stable Cardiovascular status: stable Postop Assessment: no apparent nausea or vomiting Anesthetic complications: no   No complications documented.  Last Vitals:  Vitals:   08/19/19 1230 08/19/19 1245  BP: 135/79   Pulse: (!) 103 100  Resp: 19 (!) 31  Temp: (!) 36.3 C   SpO2: 95% 97%    Last Pain:  Vitals:   08/19/19 1230  TempSrc:   PainSc: Asleep                 Shandon Matson DANIEL

## 2019-08-19 NOTE — Op Note (Signed)
Date of procedure: August 19, 2019  Date of dictation: Same  Service: Neurosurgery  Preoperative diagnosis: Right extra-axial posterior fossa mass consistent with meningioma with mass-effect and early obstructive hydrocephalus  Postoperative diagnosis: Same  Procedure Name: Right suboccipital craniectomy with resection of extra-axial tumor, microdissection  Surgeon:Ellorie Kindall A.Abbee Cremeens, M.D.  Asst. Surgeon: Reinaldo Meeker, NP  Anesthesia: General  Indication: 74 year old female with progressive behavioral, mood and balance issues.  Patient with frequent syncopal spells and possible seizures.  Work-up demonstrates evidence of a large right sided posterior fossa mass consistent with a meningioma with marked compression of her cerebellum and early obstructive hydrocephalus.  Patient presents now for craniotomy and resection of tumor in hopes of improving her symptoms.  Operative note: After induction of anesthesia, patient position in the left lateral decubitus position and appropriately padded.  Patient's right occipital region was prepped and draped sterilely.  Incision was made behind the right hairline.  This carried down sharply to the pericranium.  Suboccipital bone was cleaned of soft tissue and self-retaining retractor was placed.  Suboccipital craniectomy was then performed using high-speed drill and Kerrison rongeurs and Leksell rongeurs down to the level of the foramen magnum.  Upon removing the bone there was copious amounts of venous bleeding secondary to venous engorgement.  This was controlled with Gelfoam.  The dura was opened in a cruciate fashion.  The interface between the cerebellar hemisphere and the tumor was identified.  The tumor was then entered and progressively guarded and debulked.  Working both within the tumor and around the edges the tumor itself was soft and somewhat friable and reasonably easily resectable.  It was progressively scraped from the tentorial surface above and the  petrous dura surface laterally.  The tumor eventually was completely resected.  The tumor cavity was then progressively treated for a hemostasis.  All bleeding points were controlled with bipolar cautery or surgical form of both.  The cavity was rinsed cleaned of all blood products.  Eventually no active bleeding could be found.  The cerebellar cortex was lined with Surgicel.  The dura was not able to be reapproximated.  A 2 x 2 piece of DuraGen was then placed over the dural opening as was DuraSeal fibrin sealant and Gelfoam.  The craniectomy was covered with titanium mesh for cranioplasty.  Wound is then closed in layers of Vicryl sutures.  Surface was closed with a running 3-0 nylon suture.  No apparent complications.  Patient tolerated procedure well and she returned to the recovery room postop.

## 2019-08-20 ENCOUNTER — Inpatient Hospital Stay (HOSPITAL_COMMUNITY): Payer: Medicare HMO

## 2019-08-20 ENCOUNTER — Encounter (HOSPITAL_COMMUNITY): Payer: Self-pay | Admitting: Neurosurgery

## 2019-08-20 LAB — BASIC METABOLIC PANEL
Anion gap: 7 (ref 5–15)
BUN: 14 mg/dL (ref 8–23)
CO2: 26 mmol/L (ref 22–32)
Calcium: 8.6 mg/dL — ABNORMAL LOW (ref 8.9–10.3)
Chloride: 107 mmol/L (ref 98–111)
Creatinine, Ser: 1.06 mg/dL — ABNORMAL HIGH (ref 0.44–1.00)
GFR calc Af Amer: 59 mL/min — ABNORMAL LOW (ref >60)
GFR calc non Af Amer: 51 mL/min — ABNORMAL LOW (ref >60)
Glucose, Bld: 125 mg/dL — ABNORMAL HIGH (ref 70–99)
Potassium: 4 mmol/L (ref 3.5–5.1)
Sodium: 140 mmol/L (ref 135–145)

## 2019-08-20 LAB — CBC
HCT: 27.4 % — ABNORMAL LOW (ref 36.0–46.0)
Hemoglobin: 8.5 g/dL — ABNORMAL LOW (ref 12.0–15.0)
MCH: 30 pg (ref 26.0–34.0)
MCHC: 31 g/dL (ref 30.0–36.0)
MCV: 96.8 fL (ref 80.0–100.0)
Platelets: 225 10*3/uL (ref 150–400)
RBC: 2.83 MIL/uL — ABNORMAL LOW (ref 3.87–5.11)
RDW: 14.6 % (ref 11.5–15.5)
WBC: 13.6 10*3/uL — ABNORMAL HIGH (ref 4.0–10.5)
nRBC: 0 % (ref 0.0–0.2)

## 2019-08-20 MED FILL — Thrombin For Soln 5000 Unit: CUTANEOUS | Qty: 5000 | Status: AC

## 2019-08-20 NOTE — Progress Notes (Signed)
Postop day 1.  No issues or problems overnight.  Patient awakens to voice.  She is oriented to person and place.  She is pleasantly confused otherwise.  Her speech is fluent.  Cranial nerve function is stable.  Motor examination appears intact bilaterally.  Wound clean and dry.  Follow-up head CT scan demonstrates good appearance for surgical resection without evidence of obvious complicating features.  Follow-up labs demonstrates blood loss anemia but no other problems.  Overall doing well following resection of her posterior fossa tumor.  Plan to mobilize today continue ICU observation.

## 2019-08-20 NOTE — Evaluation (Signed)
Physical Therapy Evaluation Patient Details Name: Tracie Wiggins MRN: [id number] DOB: [birth year] Today's Date: 08/20/2019   History of Present Illness  74 year old female with history of headache, personality change, unsteadiness, frequent falls and possible frequent syncope versus seizure activity.  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  She underwent R suboccipital craniectomy w/resection of extra-axial tumor, microdissection on 08/19/19.  Clinical Impression  Patient presents with decreased mobility due to decreased balance, R inattention, decreased safety awareness, and at risk for falls.  She lives alone and utilizes cane or walker at baseline and has aides that come frequently and daughter checks on her, but she reports could not have 24 hour assist.  Feel she will need skilled PT in the acute setting and follow up post acute inpatient rehab.  Likely STSNF most appropriate as she wants to be close to home and reports does not have 24h assist at d/c.      Follow Up Recommendations SNF    Equipment Recommendations  None recommended by PT    Recommendations for Other Services       Precautions / Restrictions Precautions Precautions: Fall      Mobility  Bed Mobility Overal bed mobility: Needs Assistance Bed Mobility: Supine to Sit     Supine to sit: HOB elevated;Min assist     General bed mobility comments: assist for lifting trunk, pt brought legs off bed and scooted out to EOB with cues and increased time  Transfers Overall transfer level: Needs assistance Equipment used: Rolling walker (2 wheeled) Transfers: Sit to/from Stand Sit to Stand: Min assist;Mod assist         General transfer comment: initially mod A for lifting help, then from toilet minguard to min a for balance with grabbar and RW  Ambulation/Gait Ambulation/Gait assistance: Min  assist Gait Distance (Feet): 20 Feet (x 2 to and from bathroom) Assistive device: Rolling walker (2 wheeled) Gait Pattern/deviations: Step-to pattern;Step-through pattern;Decreased stride length;Wide base of support;Trunk flexed     General Gait Details: mod cues for proximity to walker, reports this was an issue prior to surgery, needed increased time and cues turning and stepping back to commode and to back up to recliner in the room  Stairs            Wheelchair Mobility    Modified Rankin (Stroke Patients Only)       Balance Overall balance assessment: Needs assistance Sitting-balance support: Feet supported Sitting balance-Leahy Scale: Fair Sitting balance - Comments: UE support on grabbar in bathroom vs S for safety   Standing balance support: Bilateral upper extremity supported;No upper extremity supported Standing balance-Leahy Scale: Poor Standing balance comment: when at sink to wash hands, leaned on elbows some and minguard throughout one episode of posterior LOB pt catching hold of sink                             Pertinent Vitals/Pain Pain Assessment: 0-10 Pain Score: 3  Pain Location: right side of neck Pain Descriptors / Indicators: Aching Pain Intervention(s): Monitored during session    Home Living Family/patient expects to be discharged to:: Private residence Living Arrangements: Alone Available Help at Discharge: Family;Home health Type of Home: House Home Access: Stairs to enter Entrance Stairs-Rails: Right;Left;Can reach both Technical brewer of Steps: 3 Home Layout: One level Home Equipment: Shower seat;Grab bars - tub/shower;Hand held Tourist information centre manager -  4 wheels;Walker - 2 wheels;Cane - quad      Prior Function Level of Independence: Independent with assistive device(s);Needs assistance   Gait / Transfers Assistance Needed: uses cane or walker depending on how she feels  ADL's / Homemaking Assistance Needed: aide helps  to shower, sponge bathes and puts on clothes on her own, aide helps to cook and pt gets meals on wheels        Hand Dominance        Extremity/Trunk Assessment   Upper Extremity Assessment Upper Extremity Assessment: Defer to OT evaluation    Lower Extremity Assessment Lower Extremity Assessment: Overall WFL for tasks assessed    Cervical / Trunk Assessment Cervical / Trunk Assessment: Kyphotic  Communication   Communication: No difficulties  Cognition Arousal/Alertness: Awake/alert Behavior During Therapy: WFL for tasks assessed/performed Overall Cognitive Status: Impaired/Different from baseline Area of Impairment: Orientation;Attention;Problem solving                 Orientation Level: Time Current Attention Level: Sustained         Problem Solving: Slow processing;Requires verbal cues General Comments: R inattention, some word finding difficulty      General Comments General comments (skin integrity, edema, etc.): R side of neck and head with gauze dressing, initialy pt leaning to L in bed with head and neck, improved posture sitting up in chair; patient with episode of urniary incontinence when standing from EOB    Exercises     Assessment/Plan    PT Assessment Patient needs continued PT services  PT Problem List Decreased strength;Decreased mobility;Decreased safety awareness;Decreased balance;Decreased cognition;Decreased knowledge of use of DME;Decreased activity tolerance       PT Treatment Interventions DME instruction;Therapeutic activities;Gait training;Therapeutic exercise;Patient/family education;Balance training;Functional mobility training;Stair training    PT Goals (Current goals can be found in the Care Plan section)  Acute Rehab PT Goals Patient Stated Goal: to return to living indepedent PT Goal Formulation: With patient Time For Goal Achievement: 09/03/19 Potential to Achieve Goals: Good    Frequency Min 3X/week   Barriers to  discharge        Co-evaluation               AM-PAC PT "6 Clicks" Mobility  Outcome Measure Help needed turning from your back to your side while in a flat bed without using bedrails?: A Little Help needed moving from lying on your back to sitting on the side of a flat bed without using bedrails?: A Little Help needed moving to and from a bed to a chair (including a wheelchair)?: A Little Help needed standing up from a chair using your arms (e.g., wheelchair or bedside chair)?: A Lot Help needed to walk in hospital room?: A Little Help needed climbing 3-5 steps with a railing? : A Lot 6 Click Score: 16    End of Session Equipment Utilized During Treatment: Gait belt Activity Tolerance: Patient tolerated treatment well Patient left: in chair;with call bell/phone within reach   PT Visit Diagnosis: Other abnormalities of gait and mobility (R26.89);Other symptoms and signs involving the nervous system (R29.898)    Time: 5449-2010 PT Time Calculation (min) (ACUTE ONLY): 46 min   Charges:   PT Evaluation $PT Eval Moderate Complexity: 1 Mod PT Treatments $Gait Training: 8-22 mins $Therapeutic Activity: 8-22 mins        Magda Kiel, PT Acute Rehabilitation Services OFHQR:[phone] Office:[phone] 08/20/2019   Reginia Naas 08/20/2019, 4:51 PM

## 2019-08-21 LAB — SURGICAL PATHOLOGY

## 2019-08-21 MED ORDER — DEXAMETHASONE 4 MG PO TABS
4.0000 mg | ORAL_TABLET | Freq: Three times a day (TID) | ORAL | Status: DC
  Administered 2019-08-21 – 2019-08-26 (×14): 4 mg via ORAL
  Filled 2019-08-21 (×15): qty 1

## 2019-08-21 MED ORDER — LEVETIRACETAM 500 MG PO TABS
500.0000 mg | ORAL_TABLET | Freq: Two times a day (BID) | ORAL | Status: DC
  Administered 2019-08-21 – 2019-08-26 (×10): 500 mg via ORAL
  Filled 2019-08-21 (×11): qty 1

## 2019-08-21 NOTE — Plan of Care (Signed)
Pt consumes 75-100% of meals. Good appetite. Able to feed self after set-up.

## 2019-08-21 NOTE — Evaluation (Signed)
Speech Language Pathology Evaluation Patient Details Name: Tracie Wiggins MRN: [id number] DOB: [birth year] Today's Date: 08/21/2019 Time: 9767-3419 SLP Time Calculation (min) (ACUTE ONLY): 18 min  Problem List:  Patient Active Problem List   Diagnosis Date Noted  . Meningioma ([place]) 08/18/2019  . Brain tumor ([place]) 08/17/2019  . Fall 04/06/2019  . Morbid obesity ([place]) 10/04/2014  . OAB (overactive bladder) 04/07/2014  . Hypertension 12/22/2012  . Hyperlipidemia 06/13/2012  . GERD (gastroesophageal reflux disease) 06/13/2012  . Osteoporosis 06/13/2012  . Depression 06/13/2012   Past Medical History:  Past Medical History:  Diagnosis Date  . Allergy   . Anxiety   . Arthritis   . Cataract   . Depression   . GERD (gastroesophageal reflux disease)   . Headache   . Hyperlipidemia   . Hypertension   . Osteoporosis   . Shingles    twice   Past Surgical History:  Past Surgical History:  Procedure Laterality Date  . ABDOMINAL HYSTERECTOMY    . APPENDECTOMY    . BIOPSY BREAST Right    Fluid filled cyst  . CATARACT EXTRACTION W/PHACO Left 07/20/2016   Procedure: CATARACT EXTRACTION PHACO AND INTRAOCULAR LENS PLACEMENT (IOC);  Surgeon: Baruch Goldmann, MD;  Location: [facility];  Service: Ophthalmology;  Laterality: Left;  CDE: 3.19  . CATARACT EXTRACTION W/PHACO Right 09/07/2016   Procedure: CATARACT EXTRACTION PHACO AND INTRAOCULAR LENS PLACEMENT (IOC);  Surgeon: Baruch Goldmann, MD;  Location: [facility];  Service: Ophthalmology;  Laterality: Right;  CDE: 5.04  . CHOLECYSTECTOMY    . CRANIOTOMY Right 08/19/2019   Procedure: RIGHT CRANIOTOMY TUMOR EXCISION;  Surgeon: Earnie Larsson, MD;  Location: [facility];  Service: Neurosurgery;  Laterality: Right;  . NASAL SINUS SURGERY     HPI:  74 year old female with history of headache, personality change, unsteadiness, frequent falls and possible frequent syncope versus seizure activity.  Found to have right-sided extra-axial posterior fossa mass with  marked compression of the right cerebellum and some degree of obstructive hydrocephalus.  She underwent R suboccipital craniectomy w/resection of extra-axial tumor, microdissection on 08/19/19.   Assessment / Plan / Recommendation Clinical Impression  Pt had significant difficulty completing the SLUMS, scoring a 6/30. Suspect that she is capable of scoring higher, but she is also very distractible and often dismissive of questions that appear to be challenging for her. She often responded with "I don't know" or asked why she had to do things. SLP attempted to provided Mod-Max cues for sustained attention throughout session with mild improvement in performance noted. She was disoriented to time and could not tell me her birth date. She acknowledges her difficulties though and is motivated to get better to be more independent and take care of her dog and cat. She will benefit from SLP f/u acutely, anticipating need for SNF at discharge.     SLP Assessment  SLP Recommendation/Assessment: Patient needs continued Speech Lanaguage Pathology Services SLP Visit Diagnosis: Cognitive communication deficit (R41.841)    Follow Up Recommendations  Skilled Nursing facility    Frequency and Duration min 2x/week  2 weeks      SLP Evaluation Cognition  Overall Cognitive Status: Impaired/Different from baseline Arousal/Alertness: Awake/alert Orientation Level: Oriented to person;Oriented to place;Oriented to situation;Disoriented to time Attention: Sustained Sustained Attention: Impaired Sustained Attention Impairment: Verbal basic;Functional basic Memory: Impaired Memory Impairment: Storage deficit Awareness: Impaired Awareness Impairment: Intellectual impairment;Emergent impairment;Anticipatory impairment Problem Solving: Impaired Problem Solving Impairment: Verbal basic Behaviors: Other (comment) (quick to dismiss questions that are challengin) Safety/Judgment: Impaired  Comprehension   Auditory Comprehension Overall Auditory Comprehension: Impaired Commands: Impaired One Step Basic Commands: 75-100% accurate Complex Commands: 50-74% accurate Conversation: Simple Interfering Components: Attention;Working Marine scientist    Expression Expression Primary Mode of Expression: Verbal Verbal Expression Overall Verbal Expression: Impaired Level of Generative/Spontaneous Verbalization: Conversation Naming: Impairment Divergent: 25-49% accurate Pragmatics: Impairment Impairments: Eye contact Non-Verbal Means of Communication: Not applicable   Oral / Motor  Motor Speech Overall Motor Speech: Appears within functional limits for tasks assessed   GO                    Osie Bond., M.A. [place] Acute Rehabilitation Services Pager [phone] Office [phone]  08/21/2019, 2:49 PM

## 2019-08-21 NOTE — Progress Notes (Signed)
Physical Therapy Treatment Patient Details Name: Tracie Wiggins MRN: [id number] DOB: Feb 27, 1945 Today's Date: 08/21/2019    History of Present Illness 74 year old female with history of headache, personality change, unsteadiness, frequent falls and possible frequent syncope versus seizure activity.  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  She underwent R suboccipital craniectomy w/resection of extra-axial tumor, microdissection on 08/19/19.    PT Comments    Patient progressing with ambulation distance, balance and safety.  Still with some distractibility and needing assistance for safety.  Remains appropriate for SNF level rehab unless could have 24 hour care at home. PT to follow acutely.    Follow Up Recommendations  SNF     Equipment Recommendations  None recommended by PT    Recommendations for Other Services       Precautions / Restrictions Precautions Precautions: Fall    Mobility  Bed Mobility Overal bed mobility: Needs Assistance Bed Mobility: Supine to Sit     Supine to sit: Min guard;HOB elevated     General bed mobility comments: up in chair with OT  Transfers Overall transfer level: Needs assistance Equipment used: Rolling walker (2 wheeled) Transfers: Sit to/from Stand Sit to Stand: Min guard         General transfer comment: VCs for safe hand placement  Ambulation/Gait Ambulation/Gait assistance: Min guard Gait Distance (Feet): 100 Feet (x 2) Assistive device: Rolling walker (2 wheeled) Gait Pattern/deviations: Step-through pattern;Decreased stride length;Shuffle     General Gait Details: staying inside walker without cues today, sat to rest in hallway, slow pace, assist to back up to chair in room   Stairs             Wheelchair Mobility    Modified Rankin (Stroke Patients Only)       Balance Overall balance  assessment: Needs assistance Sitting-balance support: No upper extremity supported Sitting balance-Leahy Scale: Fair Sitting balance - Comments: sitting with S no UE support   Standing balance support: Bilateral upper extremity supported Standing balance-Leahy Scale: Poor Standing balance comment: UE support for balance during session                            Cognition Arousal/Alertness: Awake/alert Behavior During Therapy: WFL for tasks assessed/performed Overall Cognitive Status: Impaired/Different from baseline Area of Impairment: Attention;Problem solving                 Orientation Level: Time (kept preservating on 2021 when I was asking her the month at beginning of session; at end of the session she looked at calendar to tell me the month (she did not need cues for this)) Current Attention Level: Selective         Problem Solving: Difficulty sequencing        Exercises Other Exercises Other Exercises: seated marching, LAQ x 10, and sit<>stand x5    General Comments        Pertinent Vitals/Pain Pain Assessment: Faces Pain Score: 2  Faces Pain Scale: Hurts a little bit Pain Location: back of head Pain Descriptors / Indicators: Sore Pain Intervention(s): Monitored during session;Repositioned    Home Living Family/patient expects to be discharged to:: Skilled nursing facility Living Arrangements: Alone Available Help at Discharge: Family;Available PRN/intermittently Type of Home: House Home Access: Stairs to enter Entrance Stairs-Rails: Right;Left;Can reach both Home Layout: One level Home Equipment: Shower seat;Grab bars - tub/shower;Hand  held shower head;Walker - 4 wheels;Walker - 2 wheels;Cane - quad      Prior Function Level of Independence: Independent with assistive device(s);Needs assistance  Gait / Transfers Assistance Needed: uses cane or walker depending on how she feels ADL's / Homemaking Assistance Needed: aide helps to shower,  sponge bathes and puts on clothes on her own, aide helps to cook and pt gets meals on wheels     PT Goals (current goals can now be found in the care plan section) Acute Rehab PT Goals Patient Stated Goal: to be back to independent Progress towards PT goals: Progressing toward goals    Frequency    Min 3X/week      PT Plan Current plan remains appropriate    Co-evaluation              AM-PAC PT "6 Clicks" Mobility   Outcome Measure  Help needed turning from your back to your side while in a flat bed without using bedrails?: A Little Help needed moving from lying on your back to sitting on the side of a flat bed without using bedrails?: A Little Help needed moving to and from a bed to a chair (including a wheelchair)?: A Little Help needed standing up from a chair using your arms (e.g., wheelchair or bedside chair)?: A Little Help needed to walk in hospital room?: A Little Help needed climbing 3-5 steps with a railing? : A Little 6 Click Score: 18    End of Session Equipment Utilized During Treatment: Gait belt Activity Tolerance: Patient tolerated treatment well Patient left: with call bell/phone within reach;in chair;with chair alarm set   PT Visit Diagnosis: Other abnormalities of gait and mobility (R26.89);Other symptoms and signs involving the nervous system (R29.898)     Time: 1450-1515 PT Time Calculation (min) (ACUTE ONLY): 25 min  Charges:  $Gait Training: 8-22 mins $Therapeutic Exercise: 8-22 mins                     Magda Kiel, PT Acute Rehabilitation Services Pager:[phone] [phone] 08/21/2019    Tracie Wiggins 08/21/2019, 6:44 PM

## 2019-08-21 NOTE — Evaluation (Signed)
Occupational Therapy Evaluation Patient Details Name: Tracie Wiggins MRN: [id number] DOB: April 29, 1945 Today's Date: 08/21/2019    History of Present Illness 75 year old female with history of headache, personality change, unsteadiness, frequent falls and possible frequent syncope versus seizure activity.  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  She underwent R suboccipital craniectomy w/resection of extra-axial tumor, microdissection on 08/19/19.   Clinical Impression   This 74 yo female admitted and underwent above presents to acute OT with PLOF of being totally independent with all basic ADLs, doing some of her IADLs, and taking care of her dog and cat. Currently pt is minguard-min A for mobility and setup-Mod A for basic ADLs.with not only decreased balance playing a role but also decreased vision/attention. She will benefit from acute OT with follow up at SNF to work back towards being able to go home again.    Follow Up Recommendations  SNF;Supervision/Assistance - 24 hour    Equipment Recommendations  Other (comment) (TBD at next venue)       Precautions / Restrictions Precautions Precautions: Fall      Mobility Bed Mobility Overal bed mobility: Needs Assistance Bed Mobility: Supine to Sit     Supine to sit: Min guard;HOB elevated     General bed mobility comments: increased time  Transfers Overall transfer level: Needs assistance Equipment used: Rolling walker (2 wheeled) Transfers: Sit to/from Stand Sit to Stand: Min guard         General transfer comment: VCs for safe hand placement    Balance Overall balance assessment: Needs assistance Sitting-balance support: No upper extremity supported;Feet supported Sitting balance-Leahy Scale: Fair     Standing balance support: No upper extremity supported;During functional activity Standing  balance-Leahy Scale: Fair Standing balance comment: standing at sink to wash hands                           ADL either performed or assessed with clinical judgement   ADL Overall ADL's : Needs assistance/impaired Eating/Feeding: Independent;Sitting   Grooming: Wash/dry hands;Standing   Upper Body Bathing: Supervision/ safety;Set up;Sitting   Lower Body Bathing: Supervison/ safety;Set up (min A sit <>stand)   Upper Body Dressing : Set up;Supervision/safety;Sitting   Lower Body Dressing: Moderate assistance (min A sit<>stand)   Toilet Transfer: Minimal assistance;Ambulation;RW;Grab bars;Comfort height toilet Toilet Transfer Details (indicate cue type and reason): VCs for safe hand placement and to not have RW too far ahead of her Toileting- Water quality scientist and Hygiene: Min guard;Sit to/from stand               Vision Baseline Vision/History: Wears glasses Wears Glasses: At all times Patient Visual Report: No change from baseline Vision Assessment?: Yes Eye Alignment: Within Functional Limits Ocular Range of Motion: Within Functional Limits Alignment/Gaze Preference: Within Defined Limits Tracking/Visual Pursuits:  (had difficulty following all of the tracking. Started off good, but then went down hill fast) Visual Fields:  (had trouble following directions for this) Additional Comments: Pt kept closing right eye at times, asked if she was having double vision and she reported no. Had her reach and touch my finger in several directions and she was able to without issues            Pertinent Vitals/Pain Pain Assessment: 0-10 Pain Score: 2  Faces Pain Scale: Hurts a little bit Pain Location: back of head Pain Descriptors / Indicators:  Sore Pain Intervention(s): Limited activity within patient's tolerance;Monitored during session     Hand Dominance Right   Extremity/Trunk Assessment Upper Extremity Assessment Upper Extremity Assessment: Overall WFL  for tasks assessed           Communication Communication Communication: No difficulties   Cognition Arousal/Alertness: Awake/alert Behavior During Therapy: WFL for tasks assessed/performed Overall Cognitive Status: Impaired/Different from baseline Area of Impairment: Orientation                 Orientation Level: Time (kept preservating on 2021 when I was asking her the month at beginning of session; at end of the session she looked at calendar to tell me the month (she did not need cues for this))                            Home Living Family/patient expects to be discharged to:: Skilled nursing facility Living Arrangements: Alone Available Help at Discharge: Family;Available PRN/intermittently Type of Home: House Home Access: Stairs to enter [employer] of Steps: 3 Entrance Stairs-Rails: Right;Left;Can reach both Home Layout: One level     Bathroom Shower/Tub: Teacher, early years/pre: Standard     Home Equipment: Shower seat;Grab bars - tub/shower;Hand held Tourist information centre manager - 4 wheels;Walker - 2 wheels;Cane - quad      Lives With: Alone    Prior Functioning/Environment Level of Independence: Independent with assistive device(s);Needs assistance  Gait / Transfers Assistance Needed: uses cane or walker depending on how she feels ADL's / Homemaking Assistance Needed: aide helps to shower, sponge bathes and puts on clothes on her own, aide helps to cook and pt gets meals on wheels            OT Problem List: Impaired balance (sitting and/or standing);Impaired vision/perception;Decreased safety awareness;Decreased cognition      OT Treatment/Interventions: Self-care/ADL training;DME and/or AE instruction;Patient/family education;Balance training;Visual/perceptual remediation/compensation    OT Goals(Current goals can be found in the care plan section) Acute Rehab OT Goals Patient Stated Goal: to be back to independent OT Goal  Formulation: With patient Time For Goal Achievement: 09/04/19 Potential to Achieve Goals: Good  OT Frequency: Min 2X/week   Barriers to D/C: Decreased caregiver support             AM-PAC OT "6 Clicks" Daily Activity     Outcome Measure Help from another person eating meals?: None Help from another person taking care of personal grooming?: A Little Help from another person toileting, which includes using toliet, bedpan, or urinal?: A Little Help from another person bathing (including washing, rinsing, drying)?: A Lot Help from another person to put on and taking off regular upper body clothing?: A Little Help from another person to put on and taking off regular lower body clothing?: A Lot 6 Click Score: 17   End of Session Equipment Utilized During Treatment: Gait belt;Rolling walker  Activity Tolerance: Patient tolerated treatment well Patient left: in chair;with call bell/phone within reach;with chair alarm set  OT Visit Diagnosis: Unsteadiness on feet (R26.81);Other abnormalities of gait and mobility (R26.89);Low vision, both eyes (H54.2);Other symptoms and signs involving cognitive function                Time: 1419-1456 OT Time Calculation (min): 37 min Charges:  OT General Charges $OT Visit: 1 Visit OT Evaluation $OT Eval Moderate Complexity: 1 Mod OT Treatments $Self Care/Home Management : 8-22 mins  [place], OTR/L Acute Clinical cytogeneticist  [phone] Office [phone]     Almon Register 08/21/2019, 3:08 PM

## 2019-08-21 NOTE — Progress Notes (Addendum)
   Providing Compassionate, Quality Care - Together   Subjective: Patient reports she feels like "she's coming back to myself." She has a little posterior occipital discomfort around her surgical site.  Objective: Vital signs in last 24 hours: Temp:  [97.6 F (36.4 C)-98.7 F (37.1 C)] 97.6 F (36.4 C) (08/20 0800) Pulse Rate:  [56-94] 75 (08/20 0900) Resp:  [6-21] 21 (08/20 0900) BP: (101-150)/(48-91) 106/91 (08/20 0900) SpO2:  [92 %-99 %] 96 % (08/20 0900)  Intake/Output from previous day: 08/19 0701 - 08/20 0700 In: 1952.1 [I.V.:1730.1; IV Piggyback:222] Out: -  Intake/Output this shift: No intake/output data recorded.  Alert and oriented x 4 PERRLA Ptosis right eye No pronator drift Ataxia bilateral upper extremities and lower extremities; R > L MAE, Strength and sensation intact Incision is covered with gauze dressing and Steri Strips; Dressing is clean, dry, and intact  Lab Results: Recent Labs    08/19/19 1210 08/20/19 0500  WBC 13.3* 13.6*  HGB 9.5* 8.5*  HCT 30.4* 27.4*  PLT 302 225   BMET Recent Labs    08/19/19 1210 08/20/19 0500  NA 137 140  K 4.0 4.0  CL 106 107  CO2 21* 26  GLUCOSE 178* 125*  BUN 15 14  CREATININE 0.86 1.06*  CALCIUM 8.1* 8.6*    Studies/Results: CT HEAD WO CONTRAST  Result Date: 08/20/2019 CLINICAL DATA:  Brain/CNS neoplasm, assess treatment response EXAM: CT HEAD WITHOUT CONTRAST TECHNIQUE: Contiguous axial images were obtained from the base of the skull through the vertex without intravenous contrast. 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 There is fluid and a small amount of blood within the resection bed. Mild mass effect on the fourth ventricle. There is moderate volume pneumocephalus without tension. The right ambient cistern is effaced. Hydrocephalus is improved. Diffuse hypoattenuation of the white matter is unchanged. Vascular: No  abnormal hyperdensity of the major intracranial arteries or dural venous sinuses. No intracranial atherosclerosis. Skull: Right posterior craniectomy with cranioplasty mesh. Soft tissue swelling of the right parietal scalp. Sinuses/Orbits: No fluid levels or advanced mucosal thickening of the visualized paranasal sinuses. No mastoid or middle ear effusion. The orbits are normal. IMPRESSION: 1. 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 Fluid and a small amount of blood within the resection bed. 2. Moderate volume pneumocephalus without tension effects. 3. Improved hydrocephalus. Electronically Signed   By: Ulyses Jarred M.D.   On: 08/20/2019 03:49   DG CHEST PORT 1 VIEW  Result Date: 08/20/2019 CLINICAL DATA:  Encounter for central line placement EXAM: PORTABLE CHEST 1 VIEW COMPARISON:  Two days ago FINDINGS: Right IJ line with tip at the SVC. No pneumothorax or interval mediastinal widening. Stable mild cardiomegaly. There is no edema, consolidation, or effusion. Artifact from EKG leads IMPRESSION: New central line without complicating feature. Electronically Signed   By: Monte Fantasia M.D.   On: 08/20/2019 04:46    Assessment/Plan: Patient is two days status post right suboccipital craniectomy for cerebellar tumor resection. Her post op scan showed good appearance of tumor resection. Her mental status is improving. Physical therapy is recommending SNF at discharge.   LOS: 4 days    -Transfer out of ICU today -Continue to mobilize with therapies   Viona Gilmore, DNP, AGNP-C Nurse Practitioner  [facility] [place]. [address], [address], [place], [place] [address] P: [phone]    F: [phone]  08/21/2019, 9:41 AM

## 2019-08-22 NOTE — Plan of Care (Signed)

## 2019-08-22 NOTE — Progress Notes (Signed)
Patient ID: Tracie Wiggins, female   DOB: [birth year], 74 y.o.   MRN: [id number] BP (!) 148/68 (BP Location: Right Arm)   Pulse (!) 59   Temp (!) 97.5 F (36.4 C)   Resp 13   Ht 5\' 2"  (1.575 m)   Wt 90.8 kg   SpO2 96%   BMI 36.61 kg/m  aLert and oriented x 4, speech is clear. She still feels it is off Perrl, full eom Dressing is dry Waiting on placement

## 2019-08-22 NOTE — Progress Notes (Signed)
Updated patient daughter. Daughter asks to speak with patient and thanks this nurse. This nurse set phone closer to patient so she could talk with her daughter for a bit.  No distress at this time. Easily arousable.

## 2019-08-23 LAB — TYPE AND SCREEN
ABO/RH(D): O POS
Antibody Screen: NEGATIVE
Unit division: 0
Unit division: 0
Unit division: 0
Unit division: 0

## 2019-08-23 LAB — BPAM RBC
Blood Product Expiration Date: 202109172359
Blood Product Expiration Date: 202109192359
Blood Product Expiration Date: 202109192359
Blood Product Expiration Date: 202109192359
ISSUE DATE / TIME: 202108180940
ISSUE DATE / TIME: 202108180940
Unit Type and Rh: 5100
Unit Type and Rh: 5100
Unit Type and Rh: 5100
Unit Type and Rh: 5100

## 2019-08-23 NOTE — Progress Notes (Signed)
2230 - Pt had 12 beats of SVT. On call provider notified. Provider advised RN to continue to monitor pt and to notify provider if it occurs again.

## 2019-08-23 NOTE — NC FL2 (Signed)
[place] LEVEL OF CARE SCREENING TOOL     IDENTIFICATION  Patient Name: Tracie Wiggins Birthdate: March 16, 1945 Sex: female Admission Date (Current Location): 08/17/2019  [facility] and [place] Number:  Herbalist and Address:  [facility], [place] [address], [place], [place] [address]      Provider Number: [id number]  Attending Physician Name and Address:  Earnie Larsson, MD  Relative Name and Phone Number:  [name], daughter. [phone]    Current Level of Care: Hospital Recommended Level of Care: [place] Prior Approval Number:    Date Approved/Denied:   PASRR Number: [id number]  Discharge Plan: SNF    Current Diagnoses: Patient Active Problem List   Diagnosis Date Noted  . Meningioma ([place]) 08/18/2019  . Brain tumor ([place]) 08/17/2019  . Fall 04/06/2019  . Morbid obesity ([place]) 10/04/2014  . OAB (overactive bladder) 04/07/2014  . Hypertension 12/22/2012  . Hyperlipidemia 06/13/2012  . GERD (gastroesophageal reflux disease) 06/13/2012  . Osteoporosis 06/13/2012  . Depression 06/13/2012    Orientation RESPIRATION BLADDER Height & Weight     Self, Time, Place, Situation  Normal Continent Weight: 200 lb 2.8 oz (90.8 kg) Height:  5\' 2"  (157.5 cm)  BEHAVIORAL SYMPTOMS/MOOD NEUROLOGICAL BOWEL NUTRITION STATUS     (Hx of syncope type behavior with altered personality) Continent Diet (Carb modified)  AMBULATORY STATUS COMMUNICATION OF NEEDS Skin   Limited Assist Verbally Normal                       Personal Care Assistance Level of Assistance  Bathing, Feeding, Dressing Bathing Assistance: Limited assistance Feeding assistance: Independent Dressing Assistance: Limited assistance     Functional Limitations Info             SPECIAL CARE FACTORS FREQUENCY  PT (By licensed PT), OT (By licensed OT), Speech therapy     PT Frequency: 5x weekly OT Frequency: 5x weekly     Speech Therapy  Frequency: 5x weekly      Contractures Contractures Info: Not present    Additional Factors Info  Code Status, Allergies Code Status Info: Full Allergies Info: See attached records           Current Medications (08/23/2019):  This is the current hospital active medication list Current Facility-Administered Medications  Medication Dose Route Frequency Provider Last Rate Last Admin  . acetaminophen (TYLENOL) tablet 650 mg  650 mg Oral Q4H PRN Earnie Larsson, MD   650 mg at 08/21/19 7782   Or  . acetaminophen (TYLENOL) suppository 650 mg  650 mg Rectal Q4H PRN Earnie Larsson, MD      . Chlorhexidine Gluconate Cloth 2 % PADS 6 each  6 each Topical Daily Earnie Larsson, MD   6 each at 08/22/19 1007  . dexamethasone (DECADRON) tablet 4 mg  4 mg Oral Q8H Earnie Larsson, MD   4 mg at 08/23/19 0528  . HYDROcodone-acetaminophen (NORCO/VICODIN) 5-325 MG per tablet 1 tablet  1 tablet Oral Q4H PRN Earnie Larsson, MD   1 tablet at 08/23/19 1008  . HYDROmorphone (DILAUDID) injection 0.5-1 mg  0.5-1 mg Intravenous Q2H PRN Earnie Larsson, MD   1 mg at 08/19/19 1704  . labetalol (NORMODYNE) injection 10-40 mg  10-40 mg Intravenous Q10 min PRN Earnie Larsson, MD      . levETIRAcetam (KEPPRA) tablet 500 mg  500 mg Oral BID Viona Gilmore D, NP   500 mg at 08/23/19 0912  .  LORazepam (ATIVAN) injection 2 mg  2 mg Intravenous Q8H PRN Earnie Larsson, MD      . naloxone [facility]) injection 0.08 mg  0.08 mg Intravenous PRN Earnie Larsson, MD      . ondansetron [facility]) tablet 4 mg  4 mg Oral Q4H PRN Earnie Larsson, MD       Or  . ondansetron [facility]) injection 4 mg  4 mg Intravenous Q4H PRN Earnie Larsson, MD   4 mg at 08/19/19 1704  . pantoprazole (PROTONIX) EC tablet 40 mg  40 mg Oral Daily Earnie Larsson, MD   40 mg at 08/23/19 0912  . promethazine (PHENERGAN) tablet 12.5-25 mg  12.5-25 mg Oral Q4H PRN Earnie Larsson, MD      . sertraline (ZOLOFT) tablet 12.5 mg  12.5 mg Oral Daily PRN Earnie Larsson, MD         Discharge Medications: Please  see discharge summary for a list of discharge medications.  Relevant Imaging Results:  Relevant Lab Results:   Additional [place], LCSW

## 2019-08-23 NOTE — Progress Notes (Signed)
Patient ID: Tracie Wiggins, female   DOB: 09-18-1945, 75 y.o.   MRN: [id number] BP (!) 147/72 (BP Location: Right Arm)   Pulse (!) 59   Temp 98.3 F (36.8 C) (Oral)   Resp 13   Ht 5\' 2"  (1.575 m)   Wt 90.8 kg   SpO2 97%   BMI 36.61 kg/m  Alert and oriented x 4 Wound is clean and dry, no signs of infection Speech is clear, no nystagmus Coordination is good

## 2019-08-23 NOTE — TOC Initial Note (Signed)
Transition of Care [facility]) - Initial/Assessment Note    Patient Details  Name: Tracie Wiggins MRN: [id number] Date of Birth: 1945-05-14  Transition of Care [facility]) CM/SW Contact:    Oretha Milch, LCSW Phone Number: 08/23/2019, 11:42 AM  Clinical Narrative: CSW contacted by medical provider regarding PT recommendation for SNF. CSW spoke with patient who reported she was receptive but wanted CSW to talk with daughter, Tim Lair. CSW contacted Lily and discussed SNF recommendation and noted family reported they are in support. Per daughter patient lives alone and has a custodial care provider six hours each week and they are unable to provide patient with 24-hour supervision. CSW noted family preference for a high-rated facility was prioritized over geographic location but would prefer closer to [facility] than further to be able to support patient while in facility. CSW has sent initial referrals out and will follow-up with family for bed offers.                   Expected Discharge Plan: Skilled Nursing Facility Barriers to Discharge: SNF Pending bed offer, Insurance Authorization   Patient Goals and CMS Choice Patient states their goals for this hospitalization and ongoing recovery are:: "I want to do what I have to." CMS Medicare.gov Compare Post Acute Care list provided to:: Patient Represenative (must comment) (Patient deferred to daughter for SNF decision) Choice offered to / list presented to : Patient (daughter)  Expected Discharge Plan and Services Expected Discharge Plan: [place] Acute Care Choice: [place] Living arrangements for the past 2 months: Single Family Home                                      Prior Living Arrangements/Services Living arrangements for the past 2 months: Single Family Home Lives with:: Self Patient language and need for interpreter reviewed:: Yes        Need for Family Participation in Patient Care:  Yes (Comment) Care giver support system in place?: Yes (comment) Current home services: Homehealth aide Criminal Activity/Legal Involvement Pertinent to Current Situation/Hospitalization: No - Comment as needed  Activities of Daily Living Home Assistive Devices/Equipment: Cane (specify quad or straight), Walker (specify type), Shower chair with back, Raised toilet seat with rails, Dentures (specify type), Eyeglasses, Built-in shower seat ADL Screening (condition at time of admission) Patient's cognitive ability adequate to safely complete daily activities?: Yes Is the patient deaf or have difficulty hearing?: No Does the patient have difficulty seeing, even when wearing glasses/contacts?: No Does the patient have difficulty concentrating, remembering, or making decisions?: Yes Patient able to express need for assistance with ADLs?: Yes Does the patient have difficulty dressing or bathing?: No Independently performs ADLs?: Yes (appropriate for developmental age) Does the patient have difficulty walking or climbing stairs?: Yes Weakness of Legs: Both Weakness of Arms/Hands: None  Permission Sought/Granted Permission sought to share information with : Family Supports, Chartered certified accountant granted to share information with : Yes, Verbal Permission Granted  Share Information with NAME: Ralph Leyden (daughter) and facility contacts for referral           Emotional Assessment Appearance:: Appears stated age Attitude/Demeanor/Rapport: Charismatic Affect (typically observed): Accepting, Appropriate Orientation: : Oriented to Self, Oriented to Situation, Oriented to  Time, Oriented to Place Alcohol / Substance Use: Not Applicable Psych Involvement: No (comment)  Admission diagnosis:  Seizure ([place]) [R56.9] Brain  tumor ([place]) [D49.6] Meningioma [facility]) [D32.9] Patient Active Problem List   Diagnosis Date Noted  . Meningioma ([place]) 08/18/2019  . Brain tumor ([place]) 08/17/2019  .  Fall 04/06/2019  . Morbid obesity ([place]) 10/04/2014  . OAB (overactive bladder) 04/07/2014  . Hypertension 12/22/2012  . Hyperlipidemia 06/13/2012  . GERD (gastroesophageal reflux disease) 06/13/2012  . Osteoporosis 06/13/2012  . Depression 06/13/2012   PCP:  Chevis Pretty, FNP Pharmacy:   [facility], [place] [address] Phone: [phone] Fax: [phone]     Social Determinants of Health (SDOH) Interventions    Readmission Risk Interventions No flowsheet data found.

## 2019-08-24 LAB — GLUCOSE, CAPILLARY
Glucose-Capillary: 184 mg/dL — ABNORMAL HIGH (ref 70–99)
Glucose-Capillary: 165 mg/dL — ABNORMAL HIGH (ref 70–99)
Glucose-Capillary: 96 mg/dL (ref 70–99)
Glucose-Capillary: 157 mg/dL — ABNORMAL HIGH (ref 70–99)

## 2019-08-24 MED ORDER — POLYETHYLENE GLYCOL 3350 17 G PO PACK
17.0000 g | PACK | Freq: Two times a day (BID) | ORAL | Status: DC
  Administered 2019-08-24 – 2019-08-26 (×4): 17 g via ORAL
  Filled 2019-08-24 (×4): qty 1

## 2019-08-24 MED ORDER — GLYCERIN (LAXATIVE) 2.1 G RE SUPP
1.0000 | Freq: Every day | RECTAL | Status: DC | PRN
  Filled 2019-08-24 (×2): qty 1

## 2019-08-24 NOTE — TOC Progression Note (Signed)
Transition of Care [facility]) - Progression Note    Patient Details  Name: Tracie Wiggins MRN: [id number] Date of Birth: [birth year]  Transition of Care [facility]) CM/SW [place], [place] Phone Number: 08/24/2019, 2:32 PM  Clinical Narrative:     Patient's daughter Ralph Leyden was accepted bed offer w/ [facility]. Patient has not received vaccine for covid. SNF will start insurance authorization but updated PT notes are needed.   Patient will need covid test when  medically ready for discharge to SNF.  Thurmond Butts, MSW, [place] Clinical Social Worker     Expected Discharge Plan: Skilled Nursing Facility Barriers to Discharge: SNF Pending bed offer, Insurance Authorization  Expected Discharge Plan and Services Expected Discharge Plan: [place] Choice: [place] arrangements for the past 2 months: Single Family Home                                       Social Determinants of Health (SDOH) Interventions    Readmission Risk Interventions No flowsheet data found.

## 2019-08-24 NOTE — Progress Notes (Addendum)
   Providing Compassionate, Quality Care - Together   Subjective: Patient reports she is "ready to get out of here." She has expected soreness at her surgical incision. Denies headache. Complaining of constipation.  Objective: Vital signs in last 24 hours: Temp:  [98 F (36.7 C)-98.7 F (37.1 C)] 98 F (36.7 C) (08/23 0758) Pulse Rate:  [48-61] 57 (08/23 0758) Resp:  [11-20] 20 (08/23 0758) BP: (129-154)/(48-88) 129/88 (08/23 0758) SpO2:  [94 %-100 %] 94 % (08/23 0758)  Intake/Output from previous day: 08/22 0701 - 08/23 0700 In: 400 [P.O.:400] Out: 800 [Urine:800] Intake/Output this shift: Total I/O In: 360 [P.O.:360] Out: -   Alert and oriented x 4 PERRLA Ptosis right eye No pronator drift Ataxia improved MAE, Strength and sensation intact Incision is covered with gauze dressing and Steri Strips; Dressing and incision are clean, dry, and intact. Dressing removed during assessment.   Lab Results: No results for input(s): WBC, HGB, HCT, PLT in the last 72 hours. BMET No results for input(s): NA, K, CL, CO2, GLUCOSE, BUN, CREATININE, CALCIUM in the last 72 hours.  Studies/Results: No results found.  Assessment/Plan: Patient is five days status post right suboccipital craniectomy for cerebellar tumor resection. Her post op scan showed good appearance of tumor resection. Her mental status is improving. Physical therapy is recommending SNF at discharge. Awaiting placement.   LOS: 7 days   -Added scheduled Miralax and prn glycerin suppository for constipation. -Continue to mobilize with assistance.   Viona Gilmore, DNP, AGNP-C Nurse Practitioner  [facility] [place] [address], [address], [place], [place] [address] P: [phone]    F: [phone]  08/24/2019, 11:49 AM

## 2019-08-24 NOTE — Progress Notes (Signed)
Physical Therapy Treatment Patient Details Name: Tracie Wiggins MRN: [id number] DOB: 1945-09-08 Today's Date: 08/24/2019    History of Present Illness 74 year old female with history of headache, personality change, unsteadiness, frequent falls and possible frequent syncope versus seizure activity.  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  She underwent R suboccipital craniectomy w/resection of extra-axial tumor, microdissection on 08/19/19.    PT Comments    Patient progressing with ambulation distance, but with extremely slow gait speed and with decreased safety distracted easily by environment.  She remains a high fall risk and will benefit from STSNF following acute stay due to not having 24 hour assist at home.  PT to continue to follow acutely.    Follow Up Recommendations  SNF     Equipment Recommendations  None recommended by PT    Recommendations for Other Services       Precautions / Restrictions Precautions Precautions: Fall    Mobility  Bed Mobility Overal bed mobility: Needs Assistance Bed Mobility: Supine to Sit     Supine to sit: HOB elevated;Min guard     General bed mobility comments: for balance as coming up to EOB  Transfers Overall transfer level: Needs assistance Equipment used: Rolling walker (2 wheeled) Transfers: Sit to/from Stand Sit to Stand: Min guard         General transfer comment: cues for safety backing up to toilet and to recliner in room  Ambulation/Gait Ambulation/Gait assistance: Min guard;Min assist Gait Distance (Feet): 150 Feet Assistive device: Rolling walker (2 wheeled) Gait Pattern/deviations: Step-through pattern;Decreased stride length;Drifts right/left;Shuffle;Trunk flexed Gait velocity: 0.28 ft/sec   General Gait Details: cues for posture, head up, proximity to walker, and for straight path   Stairs              Wheelchair Mobility    Modified Rankin (Stroke Patients Only)       Balance Overall balance assessment: Needs assistance Sitting-balance support: No upper extremity supported Sitting balance-Leahy Scale: Fair Sitting balance - Comments: sitting on toilet without UE support   Standing balance support: Bilateral upper extremity supported Standing balance-Leahy Scale: Poor Standing balance comment: UE support for balance during session                            Cognition Arousal/Alertness: Awake/alert Behavior During Therapy: WFL for tasks assessed/performed Overall Cognitive Status: Impaired/Different from baseline Area of Impairment: Attention;Problem solving                   Current Attention Level: Selective         Problem Solving: Slow processing;Difficulty sequencing General Comments: intermittent confusion, reports the grill is open today so planned to have a hot dog, but when asked where we are states [facility]      Exercises      General Comments        Pertinent Vitals/Pain Pain Assessment: Faces Faces Pain Scale: Hurts a little bit Pain Location: back of head Pain Descriptors / Indicators: Sore Pain Intervention(s): Monitored during session;Repositioned;Heat applied    Home Living                      Prior Function            PT Goals (current goals can now be found in the care plan section) Progress towards PT goals: Progressing  toward goals    Frequency    Min 3X/week      PT Plan Current plan remains appropriate    Co-evaluation              AM-PAC PT "6 Clicks" Mobility   Outcome Measure  Help needed turning from your back to your side while in a flat bed without using bedrails?: A Little Help needed moving from lying on your back to sitting on the side of a flat bed without using bedrails?: A Little Help needed moving to and from a bed to a chair (including a wheelchair)?: A  Little Help needed standing up from a chair using your arms (e.g., wheelchair or bedside chair)?: A Little Help needed to walk in hospital room?: A Little Help needed climbing 3-5 steps with a railing? : A Little 6 Click Score: 18    End of Session   Activity Tolerance: Patient tolerated treatment well Patient left: in chair;with call bell/phone within reach;with chair alarm set   PT Visit Diagnosis: Other abnormalities of gait and mobility (R26.89);Other symptoms and signs involving the nervous system (R29.898)     Time: 1410-1446 PT Time Calculation (min) (ACUTE ONLY): 36 min  Charges:  $Gait Training: 23-37 mins                     Tracie Wiggins, PT Acute Rehabilitation Services Pager:[phone] [phone] 08/24/2019    Tracie Wiggins 08/24/2019, 3:18 PM

## 2019-08-24 NOTE — TOC Progression Note (Signed)
Transition of Care [facility]) - Progression Note    Patient Details  Name: Tracie Wiggins MRN: [id number] Date of Birth: [birth year]  Transition of Care [facility]) CM/SW [place], [place] Phone Number: 08/24/2019, 11:49 AM  Clinical Narrative:     CSW spoke with patient's daughter,Lilly-gave bed offers and explained the SNF process. Patient has not received covid vaccines. CSW explained she will be quarantine for 14 day at facility/no outside visitors. Lilly states she understands, patient has been to [facility] before and is considering them again for short term rehab. Lilly states she will discuss with family and call CSW back with choice by 3PM today.   CSW will continue to follow and assist with discharge planning.  Thurmond Butts, MSW, [place] Clinical Social Worker    Expected Discharge Plan: Skilled Nursing Facility Barriers to Discharge: SNF Pending bed offer, Insurance Authorization  Expected Discharge Plan and Services Expected Discharge Plan: [place] Choice: [place] arrangements for the past 2 months: Single Family Home                                       Social Determinants of Health (SDOH) Interventions    Readmission Risk Interventions No flowsheet data found.

## 2019-08-25 LAB — GLUCOSE, CAPILLARY
Glucose-Capillary: 116 mg/dL — ABNORMAL HIGH (ref 70–99)
Glucose-Capillary: 183 mg/dL — ABNORMAL HIGH (ref 70–99)
Glucose-Capillary: 101 mg/dL — ABNORMAL HIGH (ref 70–99)
Glucose-Capillary: 193 mg/dL — ABNORMAL HIGH (ref 70–99)

## 2019-08-25 LAB — SARS CORONAVIRUS 2 (TAT 6-24 HRS): SARS Coronavirus 2: NEGATIVE

## 2019-08-25 MED ORDER — METHYLPREDNISOLONE 4 MG PO TBPK: ORAL_TABLET | ORAL | 0 refills | Status: DC

## 2019-08-25 MED ORDER — LEVETIRACETAM 500 MG PO TABS: 500.0000 mg | ORAL_TABLET | Freq: Two times a day (BID) | ORAL | Status: AC

## 2019-08-25 MED ORDER — HYDROCODONE-ACETAMINOPHEN 5-325 MG PO TABS: 1.0000 | ORAL_TABLET | Freq: Four times a day (QID) | ORAL | 0 refills | Status: DC | PRN

## 2019-08-25 MED ORDER — POLYETHYLENE GLYCOL 3350 17 G PO PACK: 17.0000 g | PACK | Freq: Two times a day (BID) | ORAL | 0 refills | Status: DC

## 2019-08-25 NOTE — Discharge Summary (Addendum)
Physician Discharge Summary      Providing Compassionate, Quality Care - Together   Patient ID: Tracie Wiggins MRN: [id number] DOB/AGE: Jul 02, 1945 74 y.o.  Admit date: 08/17/2019 Discharge date: 08/26/2019  Admission Diagnoses: Brain tumor  Discharge Diagnoses:  Active Problems:   Brain tumor ([place])   Meningioma [facility])   Discharged Condition: good  Hospital Course:  Patient underwent a right suboccipital craniectomy for cerebellar tumor resection on 08/19/2019 by Dr. Annette Stable. Her post op scan showed good appearance of tumor resection. Her mental status is improving. Physical therapy and occupational therapy have worked with the patient and are recommending SNF at discharge. The patient is ready for discharge once COVID-19 test returns.  Consults: rehabilitation medicine  Significant Diagnostic Studies: radiology: EEG  Result Date: 08/18/2019 Lora Havens, MD     08/18/2019 11:50 AM Patient Name: Tracie Wiggins MRN: [id number] Epilepsy Attending: Lora Havens Referring Physician/Provider: Dr. Varney Biles Date: 08/18/2019 Duration: 23.10 minutes Patient history: 74 year old female with seizure-like episodes.  EEG evaluate for seizures. Level of alertness: Awake AEDs during EEG study: Keppra Technical aspects: This EEG study was done with scalp electrodes positioned according to the 10-20 International system of electrode placement. Electrical activity was acquired at a sampling rate of 500Hz  and reviewed with a high frequency filter of 70Hz  and a low frequency filter of 1Hz . EEG data were recorded continuously and digitally stored. Description: The posterior dominant rhythm consists of 8 Hz activity of moderate voltage (25-35 uV) seen predominantly in posterior head regions, symmetric and reactive to eye opening and eye closing. Hyperventilation and photic stimulation were not performed.   IMPRESSION: This study is within normal limits. No seizures or epileptiform discharges were  seen throughout the recording. Lora Havens   CT HEAD WO CONTRAST  Result Date: 08/20/2019 CLINICAL DATA:  Brain/CNS neoplasm, assess treatment response EXAM: CT HEAD WITHOUT CONTRAST TECHNIQUE: Contiguous axial images were obtained from the base of the skull through the vertex without intravenous contrast. 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 There is fluid and a small amount of blood within the resection bed. Mild mass effect on the fourth ventricle. There is moderate volume pneumocephalus without tension. The right ambient cistern is effaced. Hydrocephalus is improved. Diffuse hypoattenuation of the white matter is unchanged. Vascular: No abnormal hyperdensity of the major intracranial arteries or dural venous sinuses. No intracranial atherosclerosis. Skull: Right posterior craniectomy with cranioplasty mesh. Soft tissue swelling of the right parietal scalp. Sinuses/Orbits: No fluid levels or advanced mucosal thickening of the visualized paranasal sinuses. No mastoid or middle ear effusion. The orbits are normal. IMPRESSION: 1. 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 Fluid and a small amount of blood within the resection bed. 2. Moderate volume pneumocephalus without tension effects. 3. Improved hydrocephalus. Electronically Signed   By: Ulyses Jarred M.D.   On: 08/20/2019 03:49   CT Head Wo Contrast  Result Date: 08/17/2019 CLINICAL DATA:  Seizure. EXAM: CT HEAD WITHOUT CONTRAST TECHNIQUE: Contiguous axial images were obtained from the base of the skull through the vertex without intravenous contrast. COMPARISON:  None. FINDINGS: Brain: There is mild cerebral atrophy with widening of the extra-axial spaces and ventricular dilatation. There are areas of decreased attenuation within the white matter tracts of the supratentorial brain, consistent with microvascular disease changes. A 4.6 cm x 3.8  cm well-defined isodense mass, with thin surrounding hyperdense rim, is seen within the cerebellum on the  right. There is a mild amount of medial vasogenic edema with mass effect on the fourth ventricle. Approximately 1.1 cm right to left midline shift is noted. Vascular: No hyperdense vessel or unexpected calcification. Skull: Normal. Negative for fracture or focal lesion. Sinuses/Orbits: No acute finding. Other: None. IMPRESSION: 4.6 cm x 3.8 cm well-defined isodense mass, with thin surrounding hyperdense rim, within the cerebellum on the right with associated mass effect and 1.1 cm right to left midline shift. MRI correlation is recommended. Electronically Signed   By: Virgina Norfolk M.D.   On: 08/17/2019 19:05   MR Brain W and Wo Contrast  Result Date: 08/18/2019 CLINICAL DATA:  74 year old female with seizure and posterior fossa mass on head CT yesterday. EXAM: MRI HEAD WITHOUT AND WITH CONTRAST TECHNIQUE: Multiplanar, multiecho pulse sequences of the brain and surrounding structures were obtained without and with intravenous contrast. CONTRAST:  66mL GADAVIST GADOBUTROL 1 MMOL/ML IV SOLN COMPARISON:  Head CT 08/17/2019. FINDINGS: Brain: Large round up to 49 mm diameter homogeneously enhancing mass in the right posterior fossa appears to be extra-axial (series 10, image 7) with a broad dural attachment, including a portion of the mass inseparable from the patent right sigmoid sinus (series 15, image 20, series 16, image 8). Associated posterior fossa mass effect including mild cerebellar tonsillar ectopia and leftward midline shift of the cerebellum with moderate effacement of the 4th ventricle. Only mild associated edema in the central cerebellum. But there is superimposed supratentorial ventriculomegaly with evidence of at least mild transependymal edema (series 12, image 16). No supratentorial midline shift. The suprasellar cistern is patent. No other abnormal intracranial enhancement. No other dural  thickening. No superimposed restricted diffusion to suggest acute infarction. No acute intracranial hemorrhage. Pituitary within normal limits. No cortical encephalomalacia. No chronic cerebral blood products identified. Vascular: Major intracranial vascular flow voids are preserved. The major dural venous sinuses are enhancing and appear to be patent. Skull and upper cervical spine: Negative visible cervical spine. Visualized bone marrow signal is within normal limits. Sinuses/Orbits: Postoperative changes to both globes, negative orbits. Paranasal Visualized paranasal sinuses and mastoids are stable and well pneumatized. Other: Negative scalp and face. IMPRESSION: 1. 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 Substantial posterior fossa mass effect and subsequent hydrocephalus due to subtotal compression of the 4th ventricle. Recommend Neurosurgery consultation. 2. The mass is inseparable from the right sigmoid sinus which remains patent. Electronically Signed   By: Genevie Ann M.D.   On: 08/18/2019 13:45   DG CHEST PORT 1 VIEW  Result Date: 08/20/2019 CLINICAL DATA:  Encounter for central line placement EXAM: PORTABLE CHEST 1 VIEW COMPARISON:  Two days ago FINDINGS: Right IJ line with tip at the SVC. No pneumothorax or interval mediastinal widening. Stable mild cardiomegaly. There is no edema, consolidation, or effusion. Artifact from EKG leads IMPRESSION: New central line without complicating feature. Electronically Signed   By: Monte Fantasia M.D.   On: 08/20/2019 04:46   DG Chest Port 1 View  Result Date: 08/18/2019 CLINICAL DATA:  Brain tumor, seizure, rule out aspiration EXAM: PORTABLE CHEST 1 VIEW COMPARISON:  Radiograph 04/29/2019 FINDINGS: There are some chronically coarsened interstitial changes, albeit with superimposed basilar airways and retrocardiac opacity, slightly increased from prior which in the appropriate clinical setting could reflect sequela of aspiration.  No pneumothorax or visible effusion. The aorta is calcified. The remaining cardiomediastinal contours are unremarkable. No acute osseous or soft tissue abnormality. Degenerative changes are present in the imaged spine and  shoulders. Telemetry leads overlie the chest. IMPRESSION: New basilar airways and retrocardiac opacity, in the appropriate clinical setting could reflect sequela of aspiration. Additional chronically coarsened interstitial changes seen in the lung bases. Electronically Signed   By: Lovena Le M.D.   On: 08/18/2019 00:35      Treatments: surgery: Right suboccipital craniectomy with resection of extra-axial tumor, microdissection  Discharge Exam: Blood pressure (!) 152/79, pulse 62, temperature 98.4 F (36.9 C), temperature source Oral, resp. rate 13, height 5\' 2"  (1.575 m), weight 90.8 kg, SpO2 98 %.  Alert and oriented x 4 PERRLA Ptosis right eye No pronator drift Ataxia improved MAE, Strength and sensation intact Incision is clean, dry, and intact  Disposition: Discharge disposition: 03-Skilled Nursing Facility        Allergies as of 08/25/2019      Reactions   Asa [aspirin] Anaphylaxis, Nausea Only, Rash   Codeine Anaphylaxis, Rash   Erythromycin Anaphylaxis   Penicillins Anaphylaxis, Hives   Has patient had a PCN reaction causing immediate rash, facial/tongue/throat swelling, SOB or lightheadedness with hypotension: Yes Has patient had a PCN reaction causing severe rash involving mucus membranes or skin necrosis: No Has patient had a PCN reaction that required hospitalization: No Has patient had a PCN reaction occurring within the last 10 years: No If all of the above answers are "NO", then may proceed with Cephalosporin use.   Niacin And Related Rash      Medication List    TAKE these medications   darifenacin 7.5 MG 24 hr tablet Commonly known as: ENABLEX Take 7.5 mg by mouth daily.   HYDROcodone-acetaminophen 5-325 MG tablet Commonly known as:  NORCO/VICODIN Take 1 tablet by mouth every 6 (six) hours as needed for moderate pain.   levETIRAcetam 500 MG tablet Commonly known as: KEPPRA Take 1 tablet (500 mg total) by mouth 2 (two) times daily.   methylPREDNISolone 4 MG Tbpk tablet Commonly known as: MEDROL DOSEPAK Follow instructions on packet   omeprazole 40 MG capsule Commonly known as: PRILOSEC Take 1 capsule (40 mg total) by mouth daily.   polyethylene glycol 17 g packet Commonly known as: MIRALAX / GLYCOLAX Take 17 g by mouth 2 (two) times daily.   sertraline 25 MG tablet Commonly known as: ZOLOFT Take 0.5 tablets (12.5 mg total) by mouth daily as needed (for anxiety). Only takes 0.5 tablet       Follow-up Information    Earnie Larsson, MD. Go on 09/08/2019.   Specialty: Neurosurgery Why: Appointment is at 3:45 pm. Please arrive 20 minutes early to fill out paperwork. Contact information: [address] [place] [address] [place] [address] [phone]               Signed: Patricia Nettle 08/26/2019, 12:09 PM

## 2019-08-25 NOTE — TOC Progression Note (Signed)
Transition of Care [facility]) - Progression Note    Patient Details  Name: Tracie Wiggins MRN: [id number] Date of Birth: 1945-10-24  Transition of Care [facility]) CM/SW [place], [place] Phone Number: 08/25/2019, 10:30 AM  Clinical Narrative:     CSW informed by [facility], they have received SNF authorization- patient needs covid test results back prior to discharge.   MD updated- covid order placed.  Thurmond Butts, MSW, [place] Clinical Social Worker    Expected Discharge Plan: Skilled Nursing Facility Barriers to Discharge: SNF Pending bed offer, Insurance Authorization  Expected Discharge Plan and Services Expected Discharge Plan: [place] Choice: [place] arrangements for the past 2 months: Single Family Home                                       Social Determinants of Health (SDOH) Interventions    Readmission Risk Interventions No flowsheet data found.

## 2019-08-26 DIAGNOSIS — R569 Unspecified convulsions: Secondary | ICD-10-CM | POA: Diagnosis not present

## 2019-08-26 DIAGNOSIS — I6523 Occlusion and stenosis of bilateral carotid arteries: Secondary | ICD-10-CM | POA: Diagnosis not present

## 2019-08-26 DIAGNOSIS — G910 Communicating hydrocephalus: Secondary | ICD-10-CM | POA: Diagnosis not present

## 2019-08-26 DIAGNOSIS — Z8249 Family history of ischemic heart disease and other diseases of the circulatory system: Secondary | ICD-10-CM | POA: Diagnosis not present

## 2019-08-26 DIAGNOSIS — Z20822 Contact with and (suspected) exposure to covid-19: Secondary | ICD-10-CM | POA: Diagnosis not present

## 2019-08-26 DIAGNOSIS — G4089 Other seizures: Secondary | ICD-10-CM | POA: Diagnosis not present

## 2019-08-26 DIAGNOSIS — Z7401 Bed confinement status: Secondary | ICD-10-CM | POA: Diagnosis not present

## 2019-08-26 DIAGNOSIS — D329 Benign neoplasm of meninges, unspecified: Secondary | ICD-10-CM | POA: Diagnosis not present

## 2019-08-26 DIAGNOSIS — R2990 Unspecified symptoms and signs involving the nervous system: Secondary | ICD-10-CM | POA: Diagnosis not present

## 2019-08-26 DIAGNOSIS — R4182 Altered mental status, unspecified: Secondary | ICD-10-CM | POA: Diagnosis not present

## 2019-08-26 DIAGNOSIS — K8309 Other cholangitis: Secondary | ICD-10-CM | POA: Diagnosis not present

## 2019-08-26 DIAGNOSIS — G9389 Other specified disorders of brain: Secondary | ICD-10-CM | POA: Diagnosis not present

## 2019-08-26 DIAGNOSIS — E559 Vitamin D deficiency, unspecified: Secondary | ICD-10-CM | POA: Diagnosis not present

## 2019-08-26 DIAGNOSIS — Z6835 Body mass index (BMI) 35.0-35.9, adult: Secondary | ICD-10-CM | POA: Diagnosis not present

## 2019-08-26 DIAGNOSIS — Z885 Allergy status to narcotic agent status: Secondary | ICD-10-CM | POA: Diagnosis not present

## 2019-08-26 DIAGNOSIS — R471 Dysarthria and anarthria: Secondary | ICD-10-CM | POA: Diagnosis not present

## 2019-08-26 DIAGNOSIS — Y831 Surgical operation with implant of artificial internal device as the cause of abnormal reaction of the patient, or of later complication, without mention of misadventure at the time of the procedure: Secondary | ICD-10-CM | POA: Diagnosis not present

## 2019-08-26 DIAGNOSIS — E785 Hyperlipidemia, unspecified: Secondary | ICD-10-CM | POA: Diagnosis not present

## 2019-08-26 DIAGNOSIS — Z9181 History of falling: Secondary | ICD-10-CM | POA: Diagnosis not present

## 2019-08-26 DIAGNOSIS — I469 Cardiac arrest, cause unspecified: Secondary | ICD-10-CM | POA: Diagnosis not present

## 2019-08-26 DIAGNOSIS — M2550 Pain in unspecified joint: Secondary | ICD-10-CM | POA: Diagnosis not present

## 2019-08-26 DIAGNOSIS — F330 Major depressive disorder, recurrent, mild: Secondary | ICD-10-CM | POA: Diagnosis not present

## 2019-08-26 DIAGNOSIS — Z87440 Personal history of urinary (tract) infections: Secondary | ICD-10-CM | POA: Diagnosis not present

## 2019-08-26 DIAGNOSIS — G8191 Hemiplegia, unspecified affecting right dominant side: Secondary | ICD-10-CM | POA: Diagnosis not present

## 2019-08-26 DIAGNOSIS — I10 Essential (primary) hypertension: Secondary | ICD-10-CM | POA: Diagnosis not present

## 2019-08-26 DIAGNOSIS — R531 Weakness: Secondary | ICD-10-CM | POA: Diagnosis not present

## 2019-08-26 DIAGNOSIS — G96198 Other disorders of meninges, not elsewhere classified: Secondary | ICD-10-CM | POA: Diagnosis not present

## 2019-08-26 DIAGNOSIS — I959 Hypotension, unspecified: Secondary | ICD-10-CM | POA: Diagnosis not present

## 2019-08-26 DIAGNOSIS — R29818 Other symptoms and signs involving the nervous system: Secondary | ICD-10-CM | POA: Diagnosis not present

## 2019-08-26 DIAGNOSIS — F329 Major depressive disorder, single episode, unspecified: Secondary | ICD-10-CM | POA: Diagnosis not present

## 2019-08-26 DIAGNOSIS — Z9889 Other specified postprocedural states: Secondary | ICD-10-CM | POA: Diagnosis not present

## 2019-08-26 DIAGNOSIS — G936 Cerebral edema: Secondary | ICD-10-CM | POA: Diagnosis not present

## 2019-08-26 DIAGNOSIS — Z836 Family history of other diseases of the respiratory system: Secondary | ICD-10-CM | POA: Diagnosis not present

## 2019-08-26 DIAGNOSIS — K5900 Constipation, unspecified: Secondary | ICD-10-CM | POA: Diagnosis not present

## 2019-08-26 DIAGNOSIS — Z483 Aftercare following surgery for neoplasm: Secondary | ICD-10-CM | POA: Diagnosis not present

## 2019-08-26 DIAGNOSIS — Z886 Allergy status to analgesic agent status: Secondary | ICD-10-CM | POA: Diagnosis not present

## 2019-08-26 DIAGNOSIS — G9782 Other postprocedural complications and disorders of nervous system: Secondary | ICD-10-CM | POA: Diagnosis not present

## 2019-08-26 DIAGNOSIS — R29810 Facial weakness: Secondary | ICD-10-CM | POA: Diagnosis not present

## 2019-08-26 DIAGNOSIS — M810 Age-related osteoporosis without current pathological fracture: Secondary | ICD-10-CM | POA: Diagnosis not present

## 2019-08-26 DIAGNOSIS — Z8673 Personal history of transient ischemic attack (TIA), and cerebral infarction without residual deficits: Secondary | ICD-10-CM | POA: Diagnosis not present

## 2019-08-26 DIAGNOSIS — Z79899 Other long term (current) drug therapy: Secondary | ICD-10-CM | POA: Diagnosis not present

## 2019-08-26 DIAGNOSIS — K219 Gastro-esophageal reflux disease without esophagitis: Secondary | ICD-10-CM | POA: Diagnosis not present

## 2019-08-26 DIAGNOSIS — I700 Atherosclerosis of aorta: Secondary | ICD-10-CM | POA: Diagnosis not present

## 2019-08-26 DIAGNOSIS — Z880 Allergy status to penicillin: Secondary | ICD-10-CM | POA: Diagnosis not present

## 2019-08-26 DIAGNOSIS — R414 Neurologic neglect syndrome: Secondary | ICD-10-CM | POA: Diagnosis not present

## 2019-08-26 DIAGNOSIS — Z823 Family history of stroke: Secondary | ICD-10-CM | POA: Diagnosis not present

## 2019-08-26 DIAGNOSIS — R4701 Aphasia: Secondary | ICD-10-CM | POA: Diagnosis not present

## 2019-08-26 NOTE — Care Management Important Message (Signed)
Important Message  Patient Details  Name: Tracie Wiggins MRN: [id number] Date of Birth: 03-17-1945   Medicare Important Message Given:  Yes - Important Message mailed due to current National Emergency   .[place] Parents, Sydell Axon 08/26/2019, 2:08 PM

## 2019-08-26 NOTE — Progress Notes (Signed)
  Speech Language Pathology Treatment: Cognitive-Linquistic  Patient Details Name: Tracie Wiggins MRN: [id number] DOB: Oct 14, 1945 Today's Date: 08/26/2019 Time: 0355-9741 SLP Time Calculation (min) (ACUTE ONLY): 14 min  Assessment / Plan / Recommendation Clinical Impression  Pt was seen for f/u cognitive therapy. She has some improving recall of daily events and can recall why she is taking specific medications as RN is administering them. She has reduced storage and retrieval during verbal memory task, still often saying "I don't know." After many repetitions, pt recalled 1/4 words after delayed recall, increasing to 2/4 given multiple choices. She needs Min cues during verbal tasks to reduce impulsivity, trying to make choices before hearing all of them. She is eager to get to SNF to continue to work on her recovery.    HPI HPI: 74 year old female with history of headache, personality change, unsteadiness, frequent falls and possible frequent syncope versus seizure activity.  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  She underwent R suboccipital craniectomy w/resection of extra-axial tumor, microdissection on 08/19/19.      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 [place] Acute Rehabilitation Services Pager [phone] Office [phone]  08/26/2019, 1:26 PM

## 2019-08-26 NOTE — Progress Notes (Signed)
Physical Therapy Treatment Patient Details Name: Tracie Wiggins MRN: [id number] DOB: 1945-10-06 Today's Date: 08/26/2019    History of Present Illness 74 year old female with history of headache, personality change, unsteadiness, frequent falls and possible frequent syncope versus seizure activity.  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  She underwent R suboccipital craniectomy w/resection of extra-axial tumor, microdissection on 08/19/19.    PT Comments    Patient progressing slowly with some improvement in gait speed, but remains easily distracted, veering to sides and high fall risk.  She remains appropriate for SNF level rehab prior to home with intermittent assist. PT to follow acutely.   Follow Up Recommendations  SNF     Equipment Recommendations  None recommended by PT    Recommendations for Other Services       Precautions / Restrictions Precautions Precautions: Fall    Mobility  Bed Mobility Overal bed mobility: Needs Assistance       Supine to sit: Supervision;HOB elevated        Transfers Overall transfer level: Needs assistance Equipment used: Rolling walker (2 wheeled) Transfers: Sit to/from [facility] Sit to Stand: Min guard Stand pivot transfers: Min guard       General transfer comment: assist for safety up to [facility] using RW then up to stand from 3:1  Ambulation/Gait Ambulation/Gait assistance: Min guard Gait Distance (Feet): 130 Feet Assistive device: Rolling walker (2 wheeled) Gait Pattern/deviations: Step-through pattern;Decreased stride length;Shuffle;Trunk flexed;Drifts right/left Gait velocity: 0.30 ft/sec   General Gait Details: cues for posture, head up, proximity to walker, and for straight path veering to sides   Stairs             Wheelchair Mobility    Modified Rankin (Stroke Patients  Only)       Balance Overall balance assessment: Needs assistance Sitting-balance support: No upper extremity supported Sitting balance-Leahy Scale: Good     Standing balance support: Bilateral upper extremity supported Standing balance-Leahy Scale: Poor Standing balance comment: stood at sink to brush teeth leaning on counter                            Cognition Arousal/Alertness: Awake/alert Behavior During Therapy: WFL for tasks assessed/performed Overall Cognitive Status: Impaired/Different from baseline                     Current Attention Level: Selective         Problem Solving: Slow processing;Difficulty sequencing        Exercises      General Comments        Pertinent Vitals/Pain Pain Assessment: Faces Faces Pain Scale: No hurt    Home Living                      Prior Function            PT Goals (current goals can now be found in the care plan section) Progress towards PT goals: Progressing toward goals    Frequency    Min 3X/week      PT Plan Current plan remains appropriate    Co-evaluation              AM-PAC PT "6 Clicks" Mobility   Outcome Measure  Help needed turning from your back to your side while in a flat bed without using bedrails?: None  Help needed moving from lying on your back to sitting on the side of a flat bed without using bedrails?: A Little Help needed moving to and from a bed to a chair (including a wheelchair)?: A Little Help needed standing up from a chair using your arms (e.g., wheelchair or bedside chair)?: A Little Help needed to walk in hospital room?: A Little Help needed climbing 3-5 steps with a railing? : A Little 6 Click Score: 19    End of Session   Activity Tolerance: Patient tolerated treatment well Patient left: in bed;with call bell/phone within reach   PT Visit Diagnosis: Other abnormalities of gait and mobility (R26.89);Other symptoms and signs involving  the nervous system (R29.898)     Time: 1245-8099 PT Time Calculation (min) (ACUTE ONLY): 31 min  Charges:  $Gait Training: 8-22 mins $Therapeutic Activity: 8-22 mins                     Tracie Wiggins, PT Acute Rehabilitation Services Pager:[phone] Office:[phone] 08/26/2019    Tracie Wiggins 08/26/2019, 1:41 PM

## 2019-08-26 NOTE — Progress Notes (Signed)
Gave report to [facility] at [facility]

## 2019-08-26 NOTE — Progress Notes (Signed)
   Providing Compassionate, Quality Care - Together   Subjective: Patient reports no issues overnight. She is ready to discharge to SNF.  Objective: Vital signs in last 24 hours: Temp:  [97.5 F (36.4 C)-98.6 F (37 C)] 98.6 F (37 C) (08/25 1155) Pulse Rate:  [51-79] 66 (08/25 1155) Resp:  [13-20] 16 (08/25 1155) BP: (123-154)/(59-93) 125/73 (08/25 1155) SpO2:  [96 %-100 %] 97 % (08/25 1155)  Intake/Output from previous day: 08/24 0701 - 08/25 0700 In: 1080 [P.O.:1080] Out: 700 [Urine:700] Intake/Output this shift: Total I/O In: 400 [P.O.:400] Out: 400 [Urine:400]  Alert and oriented x 4 PERRLA Ptosis right eye No pronator drift Ataxiaimproved MAE, Strength and sensation intact Incision isclean, dry, and intact  Lab Results: No results for input(s): WBC, HGB, HCT, PLT in the last 72 hours. BMET No results for input(s): NA, K, CL, CO2, GLUCOSE, BUN, CREATININE, CALCIUM in the last 72 hours.  Studies/Results: No results found.  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 Her post op scan showed good appearance of tumor resection. Her mental status is improving. Physical therapy is recommending SNF at discharge. Awaiting COVID test results.   LOS: 9 days    -Discharge to SNF once COVID-19 test results are available. -Continue to mobilize with assistance.   Viona Gilmore, DNP, AGNP-C Nurse Practitioner  [facility] [place] [address], [place], [place] [address] P: [phone]    F: [phone]  08/25/2019, 11:40 AM

## 2019-08-26 NOTE — TOC Transition Note (Signed)
Transition of Care [facility]) - CM/SW Discharge Note   Patient Details  Name: DINARA LUPU MRN: [id number] Date of Birth: [birth year]  Transition of Care [facility]) CM/SW Contact:  Vinie Sill, [place] Phone Number: 08/26/2019, 12:19 PM   Clinical Narrative:     Patient will DC to: [place] Date: 08/26/2019 Family Notified: left message for her daughter- relative answered the daughter's phone and states she will let her know. Transport By: Corey Harold   Per MD patient is ready for discharge. RN, patient, and facility notified of DC. Discharge Summary sent to facility. RN given number for [phone], Room 415-B. Ambulance transport requested for patient.   Clinical Social Worker signing off.  Thurmond Butts, MSW, [place] Clinical Social Worker  Final next level of care: Skilled Nursing Facility Barriers to Discharge: Barriers Resolved   Patient Goals and CMS Choice Patient states their goals for this hospitalization and ongoing recovery are:: "I want to do what I have to." CMS Medicare.gov Compare Post Acute Care list provided to:: Patient Represenative (must comment) (Patient deferred to daughter for SNF decision) Choice offered to / list presented to : Patient (daughter)  Discharge Placement PASRR number recieved: 08/23/19            Patient chooses bed at: [facility] Patient to be transferred to facility by: [place] Name of family member notified: spoke with family member, left message for daughter Patient and family notified of of transfer: 08/26/19  Discharge Plan and Services     Post Acute Care Choice: [place]                               Social Determinants of Health (SDOH) Interventions     Readmission Risk Interventions No flowsheet data found.

## 2019-09-08 ENCOUNTER — Ambulatory Visit (INDEPENDENT_AMBULATORY_CARE_PROVIDER_SITE_OTHER): Payer: Medicare HMO

## 2019-09-08 ENCOUNTER — Other Ambulatory Visit: Payer: Self-pay

## 2019-09-08 DIAGNOSIS — I10 Essential (primary) hypertension: Secondary | ICD-10-CM

## 2019-09-08 DIAGNOSIS — K5900 Constipation, unspecified: Secondary | ICD-10-CM | POA: Diagnosis not present

## 2019-09-08 DIAGNOSIS — Z9181 History of falling: Secondary | ICD-10-CM | POA: Diagnosis not present

## 2019-09-08 DIAGNOSIS — I959 Hypotension, unspecified: Secondary | ICD-10-CM

## 2019-09-08 DIAGNOSIS — Z8673 Personal history of transient ischemic attack (TIA), and cerebral infarction without residual deficits: Secondary | ICD-10-CM

## 2019-09-08 DIAGNOSIS — I700 Atherosclerosis of aorta: Secondary | ICD-10-CM

## 2019-09-08 DIAGNOSIS — K8309 Other cholangitis: Secondary | ICD-10-CM

## 2019-09-08 DIAGNOSIS — F330 Major depressive disorder, recurrent, mild: Secondary | ICD-10-CM | POA: Diagnosis not present

## 2019-09-08 DIAGNOSIS — D329 Benign neoplasm of meninges, unspecified: Secondary | ICD-10-CM | POA: Diagnosis not present

## 2019-09-08 DIAGNOSIS — Z87440 Personal history of urinary (tract) infections: Secondary | ICD-10-CM

## 2019-09-09 DIAGNOSIS — F329 Major depressive disorder, single episode, unspecified: Secondary | ICD-10-CM | POA: Diagnosis not present

## 2019-09-09 DIAGNOSIS — D329 Benign neoplasm of meninges, unspecified: Secondary | ICD-10-CM | POA: Diagnosis not present

## 2019-09-09 DIAGNOSIS — Z9889 Other specified postprocedural states: Secondary | ICD-10-CM | POA: Diagnosis not present

## 2019-09-09 DIAGNOSIS — K219 Gastro-esophageal reflux disease without esophagitis: Secondary | ICD-10-CM | POA: Diagnosis not present

## 2019-09-23 ENCOUNTER — Other Ambulatory Visit: Payer: Self-pay | Admitting: Neurosurgery

## 2019-09-23 DIAGNOSIS — D329 Benign neoplasm of meninges, unspecified: Secondary | ICD-10-CM

## 2019-10-06 ENCOUNTER — Ambulatory Visit: Payer: Medicare HMO | Admitting: Urology

## 2019-10-07 ENCOUNTER — Other Ambulatory Visit: Payer: Self-pay

## 2019-10-07 ENCOUNTER — Ambulatory Visit: Payer: Self-pay | Admitting: Nurse Practitioner

## 2019-10-07 ENCOUNTER — Other Ambulatory Visit: Payer: Self-pay | Admitting: Neurosurgery

## 2019-10-07 ENCOUNTER — Ambulatory Visit
Admission: RE | Admit: 2019-10-07 | Discharge: 2019-10-07 | Disposition: A | Discharge status: Home / Self Care | Payer: Medicare HMO | Source: Ambulatory Visit | Attending: Neurosurgery | Admitting: Neurosurgery

## 2019-10-07 DIAGNOSIS — D329 Benign neoplasm of meninges, unspecified: Secondary | ICD-10-CM

## 2019-10-07 DIAGNOSIS — G936 Cerebral edema: Secondary | ICD-10-CM | POA: Diagnosis not present

## 2019-10-07 DIAGNOSIS — G96198 Other disorders of meninges, not elsewhere classified: Secondary | ICD-10-CM | POA: Diagnosis not present

## 2019-10-07 DIAGNOSIS — G9389 Other specified disorders of brain: Secondary | ICD-10-CM | POA: Diagnosis not present

## 2019-10-08 ENCOUNTER — Encounter (HOSPITAL_COMMUNITY): Payer: Self-pay | Admitting: Neurosurgery

## 2019-10-08 NOTE — Progress Notes (Signed)
Received MAR from [place] at [facility] I then faxed it to pharmacy. Finished pre-op instructions and faxed to [place], [place] at [phone].

## 2019-10-08 NOTE — Anesthesia Preprocedure Evaluation (Addendum)
Anesthesia Evaluation  Patient identified by MRN, date of birth, ID band Patient awake    Reviewed: Allergy & Precautions, NPO status , Patient's Chart, lab work & pertinent test results  History of Anesthesia Complications Negative for: history of anesthetic complications  Airway Mallampati: II  TM Distance: >3 FB Neck ROM: Full    Dental no notable dental hx. (+) Teeth Intact, Dental Advisory Given   Pulmonary neg pulmonary ROS,    Pulmonary exam normal breath sounds clear to auscultation       Cardiovascular hypertension, Normal cardiovascular exam Rhythm:Regular Rate:Normal     Neuro/Psych  Headaches, PSYCHIATRIC DISORDERS Anxiety Depression    GI/Hepatic Neg liver ROS, GERD  Medicated,  Endo/Other  Morbid obesity  Renal/GU negative Renal ROS  negative genitourinary   Musculoskeletal negative musculoskeletal ROS (+) Arthritis ,   Abdominal   Peds negative pediatric ROS (+)  Hematology negative hematology ROS (+)   Anesthesia Other Findings   Reproductive/Obstetrics negative OB ROS                           Anesthesia Physical  Anesthesia Plan  ASA: III  Anesthesia Plan: General   Post-op Pain Management:    Induction: Intravenous  PONV Risk Score and Plan: 3 and Ondansetron, Dexamethasone and Treatment may vary due to age or medical condition  Airway Management Planned: Oral ETT and LMA  Additional Equipment:   Intra-op Plan:   Post-operative Plan:   Informed Consent: I have reviewed the patients History and Physical, chart, labs and discussed the procedure including the risks, benefits and alternatives for the proposed anesthesia with the patient or authorized representative who has indicated his/her understanding and acceptance.     Dental advisory given  Plan Discussed with: Anesthesiologist and CRNA  Anesthesia Plan Comments:         Anesthesia Quick  Evaluation

## 2019-10-08 NOTE — Progress Notes (Signed)
Spoke with Lovey Newcomer, LPN at [facility] for pre-op call. Pt is a resident there. Lovey Newcomer states pt is alert and oriented, but does have confusion at times. Pt will be arriving via [employer]. No family is coming that Lovey Newcomer is aware of. Pt does not have a cardiac history, does have HTN but is not diabetic. Pt had craniotomy in August. [place] states pt has not had any recent seizures.  Will need Covid test on arrival Friday AM.   Lovey Newcomer is going to fax pt's MAR to me and I then will fax pre-op instructions to [place].

## 2019-10-08 NOTE — Pre-Procedure Instructions (Signed)
Tracie Wiggins  10/08/2019    Mrs. Dai's procedure is scheduled on 10/09/19 at 8:00 AM.   Report to [facility] Entrance "A" Admitting Office at 5:30 AM.   Call this number if you have problems the morning of surgery: [phone]   Remember:  Patient is not to eat or drink after midnight tonight.  Take these medicines the morning of surgery with A SIP OF WATER: Levetiracetam (Keppra), Omeprazole (Prilosec), Sertraline (Zoloft) - if needed, Hydrocodone - if needed   Per instructions Dr. Jenita Seashore, Anesthesiologist/Ravinder Lukehart Stan Head, RN    Do not wear jewelry, make-up or nail polish.  Do not wear lotions, powders, perfumes or deodorant.  Do not shave 48 hours prior to surgery.   Do not bring valuables to the hospital.  [facility] is not responsible for any belongings or valuables.  Contacts, dentures or bridgework may not be worn into surgery.  Leave your suitcase in the car.  After surgery it may be brought to your room.  For patients admitted to the hospital, discharge time will be determined by your treatment team.  Any questions today, Thursday, please call Lilia Pro, RN

## 2019-10-09 ENCOUNTER — Other Ambulatory Visit: Payer: Self-pay

## 2019-10-09 ENCOUNTER — Inpatient Hospital Stay (HOSPITAL_COMMUNITY)
Admission: RE | Admit: 2019-10-09 | Discharge: 2019-10-15 | DRG: 032 | Disposition: A | Discharge status: Skilled Nursing Facility | Payer: Medicare HMO | Source: Skilled Nursing Facility | Attending: Neurosurgery | Admitting: Neurosurgery

## 2019-10-09 ENCOUNTER — Inpatient Hospital Stay (HOSPITAL_COMMUNITY): Payer: Medicare HMO

## 2019-10-09 ENCOUNTER — Encounter (HOSPITAL_COMMUNITY)
Admission: RE | Disposition: A | Discharge status: Skilled Nursing Facility | Payer: Self-pay | Source: Skilled Nursing Facility | Attending: Neurosurgery

## 2019-10-09 ENCOUNTER — Inpatient Hospital Stay (HOSPITAL_COMMUNITY): Payer: Medicare HMO | Admitting: Certified Registered Nurse Anesthetist

## 2019-10-09 DIAGNOSIS — R29810 Facial weakness: Secondary | ICD-10-CM | POA: Diagnosis not present

## 2019-10-09 DIAGNOSIS — Z885 Allergy status to narcotic agent status: Secondary | ICD-10-CM

## 2019-10-09 DIAGNOSIS — Z836 Family history of other diseases of the respiratory system: Secondary | ICD-10-CM | POA: Diagnosis not present

## 2019-10-09 DIAGNOSIS — R2990 Unspecified symptoms and signs involving the nervous system: Secondary | ICD-10-CM | POA: Diagnosis not present

## 2019-10-09 DIAGNOSIS — G910 Communicating hydrocephalus: Secondary | ICD-10-CM | POA: Diagnosis not present

## 2019-10-09 DIAGNOSIS — R414 Neurologic neglect syndrome: Secondary | ICD-10-CM | POA: Diagnosis not present

## 2019-10-09 DIAGNOSIS — G9782 Other postprocedural complications and disorders of nervous system: Secondary | ICD-10-CM | POA: Diagnosis not present

## 2019-10-09 DIAGNOSIS — Z880 Allergy status to penicillin: Secondary | ICD-10-CM

## 2019-10-09 DIAGNOSIS — R0981 Nasal congestion: Secondary | ICD-10-CM | POA: Diagnosis not present

## 2019-10-09 DIAGNOSIS — R112 Nausea with vomiting, unspecified: Secondary | ICD-10-CM | POA: Diagnosis not present

## 2019-10-09 DIAGNOSIS — Z20822 Contact with and (suspected) exposure to covid-19: Secondary | ICD-10-CM | POA: Diagnosis not present

## 2019-10-09 DIAGNOSIS — Z9889 Other specified postprocedural states: Secondary | ICD-10-CM | POA: Diagnosis not present

## 2019-10-09 DIAGNOSIS — R52 Pain, unspecified: Secondary | ICD-10-CM | POA: Diagnosis not present

## 2019-10-09 DIAGNOSIS — R29818 Other symptoms and signs involving the nervous system: Secondary | ICD-10-CM | POA: Diagnosis not present

## 2019-10-09 DIAGNOSIS — D329 Benign neoplasm of meninges, unspecified: Secondary | ICD-10-CM | POA: Diagnosis not present

## 2019-10-09 DIAGNOSIS — R531 Weakness: Secondary | ICD-10-CM | POA: Diagnosis not present

## 2019-10-09 DIAGNOSIS — G8191 Hemiplegia, unspecified affecting right dominant side: Secondary | ICD-10-CM | POA: Diagnosis not present

## 2019-10-09 DIAGNOSIS — Z982 Presence of cerebrospinal fluid drainage device: Secondary | ICD-10-CM | POA: Diagnosis not present

## 2019-10-09 DIAGNOSIS — E785 Hyperlipidemia, unspecified: Secondary | ICD-10-CM | POA: Diagnosis present

## 2019-10-09 DIAGNOSIS — G96198 Other disorders of meninges, not elsewhere classified: Secondary | ICD-10-CM | POA: Diagnosis not present

## 2019-10-09 DIAGNOSIS — Z886 Allergy status to analgesic agent status: Secondary | ICD-10-CM | POA: Diagnosis not present

## 2019-10-09 DIAGNOSIS — I10 Essential (primary) hypertension: Secondary | ICD-10-CM | POA: Diagnosis not present

## 2019-10-09 DIAGNOSIS — I700 Atherosclerosis of aorta: Secondary | ICD-10-CM | POA: Diagnosis not present

## 2019-10-09 DIAGNOSIS — R471 Dysarthria and anarthria: Secondary | ICD-10-CM | POA: Diagnosis not present

## 2019-10-09 DIAGNOSIS — Y831 Surgical operation with implant of artificial internal device as the cause of abnormal reaction of the patient, or of later complication, without mention of misadventure at the time of the procedure: Secondary | ICD-10-CM | POA: Diagnosis not present

## 2019-10-09 DIAGNOSIS — K219 Gastro-esophageal reflux disease without esophagitis: Secondary | ICD-10-CM | POA: Diagnosis not present

## 2019-10-09 DIAGNOSIS — M2550 Pain in unspecified joint: Secondary | ICD-10-CM | POA: Diagnosis not present

## 2019-10-09 DIAGNOSIS — R410 Disorientation, unspecified: Secondary | ICD-10-CM | POA: Diagnosis not present

## 2019-10-09 DIAGNOSIS — Z6835 Body mass index (BMI) 35.0-35.9, adult: Secondary | ICD-10-CM

## 2019-10-09 DIAGNOSIS — Z7401 Bed confinement status: Secondary | ICD-10-CM | POA: Diagnosis not present

## 2019-10-09 DIAGNOSIS — I469 Cardiac arrest, cause unspecified: Secondary | ICD-10-CM | POA: Diagnosis not present

## 2019-10-09 DIAGNOSIS — R4701 Aphasia: Secondary | ICD-10-CM | POA: Diagnosis not present

## 2019-10-09 DIAGNOSIS — Z823 Family history of stroke: Secondary | ICD-10-CM | POA: Diagnosis not present

## 2019-10-09 DIAGNOSIS — Z8249 Family history of ischemic heart disease and other diseases of the circulatory system: Secondary | ICD-10-CM | POA: Diagnosis not present

## 2019-10-09 DIAGNOSIS — K567 Ileus, unspecified: Secondary | ICD-10-CM | POA: Diagnosis not present

## 2019-10-09 DIAGNOSIS — I6523 Occlusion and stenosis of bilateral carotid arteries: Secondary | ICD-10-CM | POA: Diagnosis not present

## 2019-10-09 HISTORY — DX: Degenerative disease of nervous system, unspecified: G31.9

## 2019-10-09 HISTORY — DX: Hydrocephalus, unspecified: G91.9

## 2019-10-09 HISTORY — DX: Overactive bladder: N32.81

## 2019-10-09 HISTORY — PX: VENTRICULOPERITONEAL SHUNT: SHX204

## 2019-10-09 LAB — CBC WITH DIFFERENTIAL/PLATELET
Abs Immature Granulocytes: 0.03 10*3/uL (ref 0.00–0.07)
Abs Immature Granulocytes: 0.06 10*3/uL (ref 0.00–0.07)
Basophils Absolute: 0.1 10*3/uL (ref 0.0–0.1)
Basophils Absolute: 0 10*3/uL (ref 0.0–0.1)
Basophils Relative: 1 %
Basophils Relative: 0 %
Eosinophils Absolute: 0.2 10*3/uL (ref 0.0–0.5)
Eosinophils Absolute: 0 10*3/uL (ref 0.0–0.5)
Eosinophils Relative: 3 %
Eosinophils Relative: 0 %
HCT: 38.1 % (ref 36.0–46.0)
HCT: 41.3 % (ref 36.0–46.0)
Hemoglobin: 11.3 g/dL — ABNORMAL LOW (ref 12.0–15.0)
Hemoglobin: 12.8 g/dL (ref 12.0–15.0)
Immature Granulocytes: 0 %
Immature Granulocytes: 1 %
Lymphocytes Relative: 37 %
Lymphocytes Relative: 8 %
Lymphs Abs: 2.7 10*3/uL (ref 0.7–4.0)
Lymphs Abs: 1.1 10*3/uL (ref 0.7–4.0)
MCH: 28.6 pg (ref 26.0–34.0)
MCH: 28.4 pg (ref 26.0–34.0)
MCHC: 29.7 g/dL — ABNORMAL LOW (ref 30.0–36.0)
MCHC: 31 g/dL (ref 30.0–36.0)
MCV: 96.5 fL (ref 80.0–100.0)
MCV: 91.6 fL (ref 80.0–100.0)
Monocytes Absolute: 0.6 10*3/uL (ref 0.1–1.0)
Monocytes Absolute: 0.5 10*3/uL (ref 0.1–1.0)
Monocytes Relative: 8 %
Monocytes Relative: 4 %
Neutro Abs: 3.7 10*3/uL (ref 1.7–7.7)
Neutro Abs: 11.4 10*3/uL — ABNORMAL HIGH (ref 1.7–7.7)
Neutrophils Relative %: 51 %
Neutrophils Relative %: 87 %
Platelets: 233 10*3/uL (ref 150–400)
Platelets: 270 10*3/uL (ref 150–400)
RBC: 3.95 MIL/uL (ref 3.87–5.11)
RBC: 4.51 MIL/uL (ref 3.87–5.11)
RDW: 14.4 % (ref 11.5–15.5)
RDW: 14 % (ref 11.5–15.5)
WBC: 7.3 10*3/uL (ref 4.0–10.5)
WBC: 13.1 10*3/uL — ABNORMAL HIGH (ref 4.0–10.5)
nRBC: 0 % (ref 0.0–0.2)
nRBC: 0 % (ref 0.0–0.2)

## 2019-10-09 LAB — BASIC METABOLIC PANEL
Anion gap: 9 (ref 5–15)
BUN: 8 mg/dL (ref 8–23)
CO2: 26 mmol/L (ref 22–32)
Calcium: 9 mg/dL (ref 8.9–10.3)
Chloride: 106 mmol/L (ref 98–111)
Creatinine, Ser: 0.84 mg/dL (ref 0.44–1.00)
GFR calc non Af Amer: >60 mL/min (ref >60)
Glucose, Bld: 84 mg/dL (ref 70–99)
Potassium: 3.8 mmol/L (ref 3.5–5.1)
Sodium: 141 mmol/L (ref 135–145)

## 2019-10-09 LAB — GLUCOSE, CAPILLARY: Glucose-Capillary: 131 mg/dL — ABNORMAL HIGH (ref 70–99)

## 2019-10-09 LAB — SARS CORONAVIRUS 2 BY RT PCR (HOSPITAL ORDER, PERFORMED IN ~~LOC~~ HOSPITAL LAB): SARS Coronavirus 2: NEGATIVE

## 2019-10-09 SURGERY — SHUNT INSERTION VENTRICULAR-PERITONEAL
Anesthesia: General | Laterality: Left

## 2019-10-09 MED ORDER — SUGAMMADEX SODIUM 200 MG/2ML IV SOLN
INTRAVENOUS | Status: DC | PRN
  Administered 2019-10-09: 200 mg via INTRAVENOUS

## 2019-10-09 MED ORDER — LIDOCAINE 2% (20 MG/ML) 5 ML SYRINGE
INTRAMUSCULAR | Status: DC | PRN
  Administered 2019-10-09: 60 mg via INTRAVENOUS

## 2019-10-09 MED ORDER — ORAL CARE MOUTH RINSE: 15.0000 mL | Freq: Once | OROMUCOSAL | Status: AC

## 2019-10-09 MED ORDER — OXYCODONE HCL 5 MG PO TABS: 5.0000 mg | ORAL_TABLET | Freq: Once | ORAL | Status: DC | PRN

## 2019-10-09 MED ORDER — FENTANYL CITRATE (PF) 100 MCG/2ML IJ SOLN
INTRAMUSCULAR | Status: DC | PRN
  Administered 2019-10-09 (×4): 50 ug via INTRAVENOUS

## 2019-10-09 MED ORDER — FENTANYL CITRATE (PF) 100 MCG/2ML IJ SOLN
25.0000 ug | INTRAMUSCULAR | Status: DC | PRN
  Administered 2019-10-09: 25 ug via INTRAVENOUS
  Administered 2019-10-09: 50 ug via INTRAVENOUS

## 2019-10-09 MED ORDER — VANCOMYCIN HCL IN DEXTROSE 1-5 GM/200ML-% IV SOLN
1000.0000 mg | Freq: Two times a day (BID) | INTRAVENOUS | Status: AC
  Administered 2019-10-10 (×2): 1000 mg via INTRAVENOUS
  Filled 2019-10-09 (×2): qty 200

## 2019-10-09 MED ORDER — BACITRACIN ZINC 500 UNIT/GM EX OINT
TOPICAL_OINTMENT | CUTANEOUS | Status: DC | PRN
  Administered 2019-10-09: 1 via TOPICAL

## 2019-10-09 MED ORDER — HEMOSTATIC AGENTS (NO CHARGE) OPTIME
TOPICAL | Status: DC | PRN
  Administered 2019-10-09: 1 via TOPICAL

## 2019-10-09 MED ORDER — LIDOCAINE-EPINEPHRINE 1 %-1:100000 IJ SOLN
INTRAMUSCULAR | Status: AC
  Filled 2019-10-09: qty 1

## 2019-10-09 MED ORDER — PHENYLEPHRINE 40 MCG/ML (10ML) SYRINGE FOR IV PUSH (FOR BLOOD PRESSURE SUPPORT)
PREFILLED_SYRINGE | INTRAVENOUS | Status: DC | PRN
  Administered 2019-10-09 (×2): 80 ug via INTRAVENOUS

## 2019-10-09 MED ORDER — FENTANYL CITRATE (PF) 100 MCG/2ML IJ SOLN
INTRAMUSCULAR | Status: AC
  Administered 2019-10-09: 25 ug via INTRAVENOUS
  Filled 2019-10-09: qty 2

## 2019-10-09 MED ORDER — FENTANYL CITRATE (PF) 250 MCG/5ML IJ SOLN
INTRAMUSCULAR | Status: AC
  Filled 2019-10-09: qty 5

## 2019-10-09 MED ORDER — PROPOFOL 10 MG/ML IV BOLUS
INTRAVENOUS | Status: AC
  Filled 2019-10-09: qty 40

## 2019-10-09 MED ORDER — LABETALOL HCL 5 MG/ML IV SOLN: 10.0000 mg | INTRAVENOUS | Status: DC | PRN

## 2019-10-09 MED ORDER — ACETAMINOPHEN 325 MG PO TABS
650.0000 mg | ORAL_TABLET | ORAL | Status: DC | PRN
  Administered 2019-10-10 – 2019-10-14 (×3): 650 mg via ORAL
  Filled 2019-10-09 (×4): qty 2

## 2019-10-09 MED ORDER — LEVETIRACETAM IN NACL 1000 MG/100ML IV SOLN
1000.0000 mg | INTRAVENOUS | Status: AC
  Administered 2019-10-10: 1000 mg via INTRAVENOUS
  Filled 2019-10-09: qty 100

## 2019-10-09 MED ORDER — 0.9 % SODIUM CHLORIDE (POUR BTL) OPTIME
TOPICAL | Status: DC | PRN
  Administered 2019-10-09: 1000 mL

## 2019-10-09 MED ORDER — LIDOCAINE 2% (20 MG/ML) 5 ML SYRINGE
INTRAMUSCULAR | Status: AC
  Filled 2019-10-09: qty 5

## 2019-10-09 MED ORDER — PROPOFOL 10 MG/ML IV BOLUS
INTRAVENOUS | Status: DC | PRN
  Administered 2019-10-09: 140 mg via INTRAVENOUS
  Administered 2019-10-09: 30 mg via INTRAVENOUS

## 2019-10-09 MED ORDER — PHENYLEPHRINE 40 MCG/ML (10ML) SYRINGE FOR IV PUSH (FOR BLOOD PRESSURE SUPPORT)
PREFILLED_SYRINGE | INTRAVENOUS | Status: AC
  Filled 2019-10-09: qty 10

## 2019-10-09 MED ORDER — SERTRALINE HCL 25 MG PO TABS
12.5000 mg | ORAL_TABLET | Freq: Every day | ORAL | Status: DC | PRN
  Administered 2019-10-15: 12.5 mg via ORAL
  Filled 2019-10-09 (×3): qty 0.5

## 2019-10-09 MED ORDER — POLYETHYLENE GLYCOL 3350 17 G PO PACK
17.0000 g | PACK | Freq: Two times a day (BID) | ORAL | Status: DC
  Administered 2019-10-10 – 2019-10-15 (×10): 17 g via ORAL
  Filled 2019-10-09 (×11): qty 1

## 2019-10-09 MED ORDER — DEXAMETHASONE SODIUM PHOSPHATE 10 MG/ML IJ SOLN
10.0000 mg | Freq: Once | INTRAMUSCULAR | Status: AC
  Administered 2019-10-09: 10 mg via INTRAVENOUS
  Filled 2019-10-09: qty 1

## 2019-10-09 MED ORDER — LEVETIRACETAM 750 MG PO TABS: 750.0000 mg | ORAL_TABLET | Freq: Two times a day (BID) | ORAL | Status: DC

## 2019-10-09 MED ORDER — DARIFENACIN HYDROBROMIDE ER 7.5 MG PO TB24
7.5000 mg | ORAL_TABLET | Freq: Every day | ORAL | Status: DC
  Administered 2019-10-11 – 2019-10-15 (×5): 7.5 mg via ORAL
  Filled 2019-10-09 (×7): qty 1

## 2019-10-09 MED ORDER — CHLORHEXIDINE GLUCONATE CLOTH 2 % EX PADS: 6.0000 | MEDICATED_PAD | Freq: Once | CUTANEOUS | Status: DC

## 2019-10-09 MED ORDER — ACETAMINOPHEN 650 MG RE SUPP: 650.0000 mg | RECTAL | Status: DC | PRN

## 2019-10-09 MED ORDER — THROMBIN 5000 UNITS EX SOLR
CUTANEOUS | Status: AC
  Filled 2019-10-09: qty 10000

## 2019-10-09 MED ORDER — LEVETIRACETAM 500 MG PO TABS
500.0000 mg | ORAL_TABLET | Freq: Two times a day (BID) | ORAL | Status: DC
  Filled 2019-10-09: qty 1

## 2019-10-09 MED ORDER — ROCURONIUM BROMIDE 10 MG/ML (PF) SYRINGE
PREFILLED_SYRINGE | INTRAVENOUS | Status: DC | PRN
  Administered 2019-10-09: 50 mg via INTRAVENOUS

## 2019-10-09 MED ORDER — PANTOPRAZOLE SODIUM 40 MG PO TBEC
40.0000 mg | DELAYED_RELEASE_TABLET | Freq: Every day | ORAL | Status: DC
  Administered 2019-10-11 – 2019-10-15 (×5): 40 mg via ORAL
  Filled 2019-10-09 (×6): qty 1

## 2019-10-09 MED ORDER — PROMETHAZINE HCL 25 MG PO TABS
12.5000 mg | ORAL_TABLET | ORAL | Status: DC | PRN
  Administered 2019-10-14: 25 mg via ORAL
  Filled 2019-10-09: qty 1

## 2019-10-09 MED ORDER — CHLORHEXIDINE GLUCONATE 0.12 % MT SOLN
15.0000 mL | Freq: Once | OROMUCOSAL | Status: AC
  Administered 2019-10-09: 15 mL via OROMUCOSAL
  Filled 2019-10-09: qty 15

## 2019-10-09 MED ORDER — LACTATED RINGERS IV SOLN: INTRAVENOUS | Status: DC | PRN

## 2019-10-09 MED ORDER — ONDANSETRON HCL 4 MG PO TABS
4.0000 mg | ORAL_TABLET | ORAL | Status: DC | PRN
  Administered 2019-10-14: 4 mg via ORAL
  Filled 2019-10-09: qty 1

## 2019-10-09 MED ORDER — ONDANSETRON HCL 4 MG/2ML IJ SOLN: 4.0000 mg | Freq: Once | INTRAMUSCULAR | Status: DC | PRN

## 2019-10-09 MED ORDER — LEVETIRACETAM 500 MG PO TABS: 500.0000 mg | ORAL_TABLET | Freq: Two times a day (BID) | ORAL | Status: DC

## 2019-10-09 MED ORDER — ONDANSETRON HCL 4 MG/2ML IJ SOLN
4.0000 mg | INTRAMUSCULAR | Status: DC | PRN
  Administered 2019-10-12 – 2019-10-13 (×2): 4 mg via INTRAVENOUS
  Filled 2019-10-09 (×2): qty 2

## 2019-10-09 MED ORDER — LACTATED RINGERS IV SOLN: INTRAVENOUS | Status: DC

## 2019-10-09 MED ORDER — SODIUM CHLORIDE 0.9 % IV SOLN
2000.0000 mg | Freq: Once | INTRAVENOUS | Status: DC
  Filled 2019-10-09: qty 20

## 2019-10-09 MED ORDER — MEPERIDINE HCL 25 MG/ML IJ SOLN: 6.2500 mg | INTRAMUSCULAR | Status: DC | PRN

## 2019-10-09 MED ORDER — HYDROCODONE-ACETAMINOPHEN 5-325 MG PO TABS
1.0000 | ORAL_TABLET | Freq: Four times a day (QID) | ORAL | Status: DC | PRN
  Administered 2019-10-11 – 2019-10-14 (×5): 1 via ORAL
  Filled 2019-10-09 (×5): qty 1

## 2019-10-09 MED ORDER — THROMBIN 5000 UNITS EX SOLR
CUTANEOUS | Status: DC | PRN
  Administered 2019-10-09: 10000 [IU] via TOPICAL

## 2019-10-09 MED ORDER — ACETAMINOPHEN 160 MG/5ML PO SOLN: 325.0000 mg | ORAL | Status: DC | PRN

## 2019-10-09 MED ORDER — OXYCODONE HCL 5 MG/5ML PO SOLN: 5.0000 mg | Freq: Once | ORAL | Status: DC | PRN

## 2019-10-09 MED ORDER — ESMOLOL HCL 100 MG/10ML IV SOLN
INTRAVENOUS | Status: DC | PRN
  Administered 2019-10-09 (×5): 20 mg via INTRAVENOUS

## 2019-10-09 MED ORDER — IOHEXOL 350 MG/ML SOLN
100.0000 mL | Freq: Once | INTRAVENOUS | Status: AC | PRN
  Administered 2019-10-09: 100 mL via INTRAVENOUS

## 2019-10-09 MED ORDER — ACETAMINOPHEN 325 MG PO TABS: 325.0000 mg | ORAL_TABLET | ORAL | Status: DC | PRN

## 2019-10-09 MED ORDER — ONDANSETRON HCL 4 MG/2ML IJ SOLN
INTRAMUSCULAR | Status: AC
  Filled 2019-10-09: qty 2

## 2019-10-09 MED ORDER — ROCURONIUM BROMIDE 10 MG/ML (PF) SYRINGE
PREFILLED_SYRINGE | INTRAVENOUS | Status: AC
  Filled 2019-10-09: qty 10

## 2019-10-09 MED ORDER — LABETALOL HCL 5 MG/ML IV SOLN
INTRAVENOUS | Status: AC
  Filled 2019-10-09: qty 4

## 2019-10-09 MED ORDER — BACITRACIN ZINC 500 UNIT/GM EX OINT
TOPICAL_OINTMENT | CUTANEOUS | Status: AC
  Filled 2019-10-09: qty 28.35

## 2019-10-09 MED ORDER — ONDANSETRON HCL 4 MG/2ML IJ SOLN
INTRAMUSCULAR | Status: DC | PRN
  Administered 2019-10-09: 4 mg via INTRAVENOUS

## 2019-10-09 MED ORDER — VANCOMYCIN HCL IN DEXTROSE 1-5 GM/200ML-% IV SOLN
1000.0000 mg | INTRAVENOUS | Status: AC
  Administered 2019-10-09: 1000 mg via INTRAVENOUS
  Filled 2019-10-09: qty 200

## 2019-10-09 SURGICAL SUPPLY — 70 items
BAG DECANTER FOR FLEXI CONT (MISCELLANEOUS) ×2 IMPLANT
BAND RUBBER #18 3X1/16 STRL (MISCELLANEOUS) IMPLANT
BENZOIN TINCTURE PRP APPL 2/3 (GAUZE/BANDAGES/DRESSINGS) ×2 IMPLANT
BLADE SURG 11 STRL SS (BLADE) ×2 IMPLANT
BNDG ADH 1X3 SHEER STRL LF (GAUZE/BANDAGES/DRESSINGS) IMPLANT
BNDG GAUZE ELAST 4 BULKY (GAUZE/BANDAGES/DRESSINGS) IMPLANT
BUR ACORN 6.0 PRECISION (BURR) ×2 IMPLANT
CANISTER SUCT 3000ML PPV (MISCELLANEOUS) ×2 IMPLANT
CARTRIDGE OIL MAESTRO DRILL (MISCELLANEOUS) ×1 IMPLANT
CATH VENTRICULAR 9CM (Shunt) ×2 IMPLANT
CLIP RANEY DISP (INSTRUMENTS) IMPLANT
COVER WAND RF STERILE (DRAPES) IMPLANT
DERMABOND ADVANCED (GAUZE/BANDAGES/DRESSINGS) ×1
DERMABOND ADVANCED .7 DNX12 (GAUZE/BANDAGES/DRESSINGS) ×1 IMPLANT
DIFFUSER DRILL AIR PNEUMATIC (MISCELLANEOUS) ×2 IMPLANT
DRAPE INCISE IOBAN 85X60 (DRAPES) ×2 IMPLANT
DRAPE ORTHO SPLIT 77X108 STRL (DRAPES) ×2
DRAPE SURG 17X23 STRL (DRAPES) IMPLANT
DRAPE SURG ORHT 6 SPLT 77X108 (DRAPES) ×1 IMPLANT
DRSG OPSITE 4X5.5 SM (GAUZE/BANDAGES/DRESSINGS) ×4 IMPLANT
DRSG OPSITE POSTOP 3X4 (GAUZE/BANDAGES/DRESSINGS) ×2 IMPLANT
DRSG OPSITE POSTOP 4X6 (GAUZE/BANDAGES/DRESSINGS) ×2 IMPLANT
ELECT REM PT RETURN 9FT ADLT (ELECTROSURGICAL) ×2
ELECTRODE REM PT RTRN 9FT ADLT (ELECTROSURGICAL) ×1 IMPLANT
GAUZE SPONGE 4X4 12PLY STRL (GAUZE/BANDAGES/DRESSINGS) ×2 IMPLANT
GLOVE BIO SURGEON STRL SZ 6.5 (GLOVE) ×2 IMPLANT
GLOVE BIOGEL PI IND STRL 6.5 (GLOVE) ×1 IMPLANT
GLOVE BIOGEL PI INDICATOR 6.5 (GLOVE) ×1
GLOVE ECLIPSE 9.0 STRL (GLOVE) ×4 IMPLANT
GOWN STRL REUS W/ TWL LRG LVL3 (GOWN DISPOSABLE) IMPLANT
GOWN STRL REUS W/ TWL XL LVL3 (GOWN DISPOSABLE) IMPLANT
GOWN STRL REUS W/TWL 2XL LVL3 (GOWN DISPOSABLE) IMPLANT
GOWN STRL REUS W/TWL LRG LVL3 (GOWN DISPOSABLE)
GOWN STRL REUS W/TWL XL LVL3 (GOWN DISPOSABLE)
HEMOSTAT SURGICEL 2X14 (HEMOSTASIS) IMPLANT
KIT BASIN OR (CUSTOM PROCEDURE TRAY) ×2 IMPLANT
KIT TURNOVER KIT B (KITS) ×2 IMPLANT
MARKER SKIN DUAL TIP RULER LAB (MISCELLANEOUS) ×2 IMPLANT
NS IRRIG 1000ML POUR BTL (IV SOLUTION) ×2 IMPLANT
OIL CARTRIDGE MAESTRO DRILL (MISCELLANEOUS) ×2
PACK LAMINECTOMY NEURO (CUSTOM PROCEDURE TRAY) ×2 IMPLANT
PAD ARMBOARD 7.5X6 YLW CONV (MISCELLANEOUS) ×2 IMPLANT
PASSER CATH 65CM DISP (NEUROSURGERY SUPPLIES) ×2 IMPLANT
PATTIES SURGICAL .5 X3 (DISPOSABLE) IMPLANT
SHEATH PERITONEAL INTRO 46 (SHEATH) IMPLANT
SHEATH PERITONEAL INTRO 61 (SHEATH) IMPLANT
SHUNT STRATA 11 SNAP REG (Shunt) ×2 IMPLANT
SPONGE INTESTINAL PEANUT (DISPOSABLE) IMPLANT
SPONGE LAP 4X18 RFD (DISPOSABLE) ×2 IMPLANT
SPONGE SURGIFOAM ABS GEL SZ50 (HEMOSTASIS) IMPLANT
STAPLER VISISTAT 35W (STAPLE) ×2 IMPLANT
STRIP CLOSURE SKIN 1/2X4 (GAUZE/BANDAGES/DRESSINGS) ×2 IMPLANT
SUT CHROMIC 3 0 SH 27 (SUTURE) IMPLANT
SUT ETHILON 3 0 FSL (SUTURE) IMPLANT
SUT ETHILON 4 0 PS 2 18 (SUTURE) IMPLANT
SUT NURALON 4 0 TR CR/8 (SUTURE) IMPLANT
SUT SILK 0 TIES 10X30 (SUTURE) IMPLANT
SUT SILK 2 0 TIES 17X18 (SUTURE) ×2
SUT SILK 2-0 18XBRD TIE BLK (SUTURE) ×1 IMPLANT
SUT SILK 3 0 SH 30 (SUTURE) IMPLANT
SUT VIC AB 2-0 CT2 18 VCP726D (SUTURE) ×4 IMPLANT
SUT VIC AB 3-0 SH 8-18 (SUTURE) ×2 IMPLANT
SUT VICRYL 4-0 PS2 18IN ABS (SUTURE) IMPLANT
SYR 5ML LL (SYRINGE) IMPLANT
SYR CONTROL 10ML LL (SYRINGE) ×2 IMPLANT
TOWEL GREEN STERILE (TOWEL DISPOSABLE) ×2 IMPLANT
TOWEL GREEN STERILE FF (TOWEL DISPOSABLE) ×2 IMPLANT
TRAY FOLEY MTR SLVR 16FR STAT (SET/KITS/TRAYS/PACK) IMPLANT
UNDERPAD 30X36 HEAVY ABSORB (UNDERPADS AND DIAPERS) ×2 IMPLANT
WATER STERILE IRR 1000ML POUR (IV SOLUTION) ×2 IMPLANT

## 2019-10-09 NOTE — Progress Notes (Signed)
[place] reviewed, catheter tip is intra-ventricular, pneumocephalus along tract is due to catheter placement, no ICH, pseudomeningocele is smaller. 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

## 2019-10-09 NOTE — Transfer of Care (Signed)
Immediate Anesthesia Transfer of Care Note  Patient: Tracie Wiggins  Procedure(s) Performed: SHUNT REPLACEMENT LEFT (Left )  Patient Location: PACU  Anesthesia Type:General  Level of Consciousness: awake and alert   Airway & Oxygen Therapy: Patient Spontanous Breathing and Patient connected to face mask oxygen  Post-op Assessment: Report given to RN and Post -op Vital signs reviewed and stable  Post vital signs: Reviewed and stable  Last Vitals:  Vitals Value Taken Time  BP 132/109 10/09/19 0954  Temp    Pulse 84 10/09/19 0956  Resp 16 10/09/19 0956  SpO2 100 % 10/09/19 0956  Vitals shown include unvalidated device data.  Last Pain:  Vitals:   10/09/19 0546  TempSrc: Oral         Complications: No complications documented.

## 2019-10-09 NOTE — Progress Notes (Signed)
2050 - Pt has right sided weakness, unable to follow commands on right side, right sided facial droop,roght side neglect, aphasia, and left gaze. Last known well 1338.  BP 159/88. CBG 159. NIH 16. Rapid response and on call provider notified. RR nurse called code stroke and pt was taken to CT. New orders received and implemented.

## 2019-10-09 NOTE — H&P (Signed)
Tracie Wiggins is an 74 y.o. female.   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  Patient presents now for left occipital VP shunt.  Past Medical History:  Diagnosis Date  . Allergy   . Anxiety   . Arthritis   . Cataract   . Degenerative disease of nervous system ([place])   . Depression   . GERD (gastroesophageal reflux disease)   . Headache   . Hydrocephalus ([place])   . Hyperlipidemia   . Hypertension   . Osteoporosis   . Overactive bladder   . Shingles    twice    Past Surgical History:  Procedure Laterality Date  . ABDOMINAL HYSTERECTOMY    . APPENDECTOMY    . BIOPSY BREAST Right    Fluid filled cyst  . CATARACT EXTRACTION W/PHACO Left 07/20/2016   Procedure: CATARACT EXTRACTION PHACO AND INTRAOCULAR LENS PLACEMENT (IOC);  Surgeon: Baruch Goldmann, MD;  Location: [facility];  Service: Ophthalmology;  Laterality: Left;  CDE: 3.19  . CATARACT EXTRACTION W/PHACO Right 09/07/2016   Procedure: CATARACT EXTRACTION PHACO AND INTRAOCULAR LENS PLACEMENT (IOC);  Surgeon: Baruch Goldmann, MD;  Location: [facility];  Service: Ophthalmology;  Laterality: Right;  CDE: 5.04  . CHOLECYSTECTOMY    . CRANIOTOMY Right 08/19/2019   Procedure: RIGHT CRANIOTOMY TUMOR EXCISION;  Surgeon: Earnie Larsson, MD;  Location: [place];  Service: Neurosurgery;  Laterality: Right;  . NASAL SINUS SURGERY      Family History  Problem Relation Age of Onset  . COPD Mother   . Stroke Mother   . Heart disease Father   . Heart attack Father   . Heart disease Sister   . Diabetes Sister   . Heart attack Sister   . Stroke Sister   . Healthy Daughter    Social History:  reports that she has never smoked. She has never used smokeless tobacco. She reports that she does not drink alcohol and does not use drugs.  Allergies:   Allergies  Allergen Reactions  . Asa [Aspirin] Anaphylaxis, Nausea Only and Rash  . Codeine Anaphylaxis and Rash  . Erythromycin Anaphylaxis  . Penicillins Anaphylaxis and Hives    Has patient had a PCN reaction causing immediate rash, facial/tongue/throat swelling, SOB or lightheadedness with hypotension: Yes Has patient had a PCN reaction causing severe rash involving mucus membranes or skin necrosis: No Has patient had a PCN reaction that required hospitalization: No Has patient had a PCN reaction occurring within the last 10 years: No If all of the above answers are "NO", then may proceed with Cephalosporin use.   . Niacin And Related Rash    Medications Prior to Admission  Medication Sig Dispense Refill  . darifenacin (ENABLEX) 7.5 MG 24 hr tablet Take 7.5 mg by mouth daily.    [name] HYDROcodone-acetaminophen (NORCO/VICODIN) 5-325 MG tablet Take 1 tablet by mouth every 6 (six) hours as needed for moderate pain. 28 tablet 0  . levETIRAcetam (KEPPRA) 500 MG tablet Take 1 tablet (500 mg total) by mouth 2 (two) times daily. 60 tablet   . omeprazole (PRILOSEC) 40 MG capsule Take 1 capsule (40 mg total) by mouth daily. 90 capsule 1  . polyethylene glycol (MIRALAX / GLYCOLAX) 17 g packet Take 17 g by mouth 2 (two) times daily. 14 each 0  . methylPREDNISolone (MEDROL DOSEPAK) 4 MG TBPK tablet Follow instructions on  packet 1 each 0  . sertraline (ZOLOFT) 25 MG tablet Take 0.5 tablets (12.5 mg total) by mouth daily as needed (for anxiety). Only takes 0.5 tablet 90 tablet 1    Results for orders placed or performed during the hospital encounter of 10/09/19 (from the past 48 hour(s))  SARS Coronavirus 2 by RT PCR (hospital order, performed in [facility] hospital lab) Nasopharyngeal Nasopharyngeal Swab     Status: None   Collection Time: 10/09/19  5:49 AM   Specimen: Nasopharyngeal Swab  Result Value Ref Range   SARS Coronavirus 2 NEGATIVE NEGATIVE    Comment: (NOTE) SARS-CoV-2 target nucleic  acids are NOT DETECTED.  The SARS-CoV-2 RNA is generally detectable in upper and lower respiratory specimens during the acute phase of infection. The lowest concentration of SARS-CoV-2 viral copies this assay can detect is 250 copies / mL. A negative result does not preclude SARS-CoV-2 infection and should not be used as the sole basis for treatment or other patient management decisions.  A negative result may occur with improper specimen collection / handling, submission of specimen other than nasopharyngeal swab, presence of viral mutation(s) within the areas targeted by this assay, and inadequate number of viral copies (<250 copies / mL). A negative result must be combined with clinical observations, patient history, and epidemiological information.  Fact Sheet for Patients:   StrictlyIdeas.no  Fact Sheet for Healthcare Providers: BankingDealers.co.za  This test is not yet approved or  cleared by the [place] FDA and has been authorized for detection and/or diagnosis of SARS-CoV-2 by FDA under an Emergency Use Authorization (EUA).  This EUA will remain in effect (meaning this test can be used) for the duration of the COVID-19 declaration under Section 564(b)(1) of the Act, 21 U.S.C. section 360bbb-3(b)(1), unless the authorization is terminated or revoked sooner.  Performed at [facility], [place] [address], [place], [place] [address]    CT HEAD WO CONTRAST  Result Date: 10/07/2019 CLINICAL DATA:  History of meningioma surgery. EXAM: CT HEAD WITHOUT CONTRAST TECHNIQUE: Contiguous axial images were obtained from the base of the skull through the vertex without intravenous contrast. COMPARISON:  08/20/2019 FINDINGS: Brain: Meningioma resection from the right posterior fossa with resolved hydrocephalus. Postoperative collection/mass defect in the right posterior fossa, 5.2 x 2.5 cm on axial slices. This is contiguous through the  craniectomy defect with a pseudomeningocele in the overlying deep scalp which measures 6.2 x 7.8 x 2 cm. Resolved cerebellar swelling and hemorrhage. Resolved blood products within the extra-axial space. No evidence of acute infarct, acute hemorrhage, or intra-axial mass. Chronic small vessel ischemia in the cerebral white matter. Vascular: No hyperdense vessel or unexpected calcification. Skull: As above Sinuses/Orbits: Bilateral cataract resection. IMPRESSION: Large pseudomeningocele encompassing the craniotomy site. A contiguous right posterior fossa extra-axial collection has also increased in size, although cerebellar/fourth ventricular mass effect has diminished due to resolved postoperative edema and hemorrhage. Electronically Signed   By: Monte Fantasia M.D.   On: 10/07/2019 11:36    Pertinent items noted in HPI and remainder of comprehensive ROS otherwise negative.  Blood pressure (!) 152/91, pulse 62, temperature 98.3 F (36.8 C), temperature source Oral, resp. rate 18, height 5\' 3"  (1.6 m), SpO2 99 %. Patient is awake and alert.  She is oriented but mildly confused but pleasantly so.  Speech is fluent.  Cranial nerve function normal bilateral.  Motor examination intact.  Sensor examination nonfocal.  Gait unsteady.  Examination head ears eyes nose throat demonstrates a well-healed  suboccipital incision with underlying pseudomeningocele.  Chest and abdomen benign.  Extremities free from injury deformity.  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  Plan left-sided ventriculoperitoneal shunt placement.  Risks and benefits been explained.  Patient wishes to proceed.  Mallie Mussel A Cristi Gwynn 10/09/2019, 7:56 AM

## 2019-10-09 NOTE — Anesthesia Postprocedure Evaluation (Signed)
Anesthesia Post Note  Patient: Tracie Wiggins  Procedure(s) Performed: SHUNT REPLACEMENT LEFT (Left )     Patient location during evaluation: PACU Anesthesia Type: General Level of consciousness: awake and alert Pain management: pain level controlled Vital Signs Assessment: post-procedure vital signs reviewed and stable Respiratory status: spontaneous breathing, nonlabored ventilation, respiratory function stable and patient connected to nasal cannula oxygen Cardiovascular status: blood pressure returned to baseline and stable Postop Assessment: no apparent nausea or vomiting Anesthetic complications: no   No complications documented.  Last Vitals:  Vitals:   10/09/19 0951 10/09/19 1000  BP: (!) 171/85 (!) 161/101  Pulse: 87 95  Resp: 16 17  Temp: (!) 36 C   SpO2: 99% 91%    Last Pain:  Vitals:   10/09/19 0951  TempSrc:   PainSc: 0-No pain                 [name]

## 2019-10-09 NOTE — Significant Event (Addendum)
Rapid Response Event Note   Reason for Call :  Called d/t R sided weakness and L gaze preference.  Pt is s/p VP shunt placement today d/t hydrocephalus. At 1338, the neurosurgeon saw pt and noted increased R sided weakness. It was also noted that pt could follow commands bilaterally.   Initial Focused Assessment:  Pt laying in bed with eyes open. She is alert and oriented to self. She has R sided weakness, R facial droop, a L gaze preference, aphasia, R hemianopia and R sided neglect. NIH-16, CBG-159. LSN-1200. Code Stroke called at 2103. T-97.6, HR-103, BP-161/96, RR-18, SpO2-95 on RA.   Interventions:  CT head-negative for acute changes CTA/CTP-no LVO Keppra 1g IV x 1, 500mg  PO BID CBCD/CMP/Ammonia/U/A  Plan of Care:  Per Dr. Zada Finders who spoke with Dr. Annette Stable, this is not a stroke. Continue to monitor pt closely. Continue current plan of care.   Event Summary:   MD Notified: 2107 Call Time:2050 Arrival Time:2057 End EHUD:1497  Dillard Essex, RN

## 2019-10-09 NOTE — Brief Op Note (Signed)
10/09/2019  9:30 AM  PATIENT:  Tracie Wiggins  74 y.o. female  PRE-OPERATIVE DIAGNOSIS:  Hydrocephalus  POST-OPERATIVE DIAGNOSIS:  Hydrocephalus  PROCEDURE:  Procedure(s): SHUNT REPLACEMENT LEFT (Left)  SURGEON:  Surgeon(s) and Role:    * Earnie Larsson, MD - Primary  PHYSICIAN ASSISTANT:   ASSISTANTSMearl Latin   ANESTHESIA:   general  EBL:  20 mL   BLOOD ADMINISTERED:none  DRAINS: none   LOCAL MEDICATIONS USED:  NONE  SPECIMEN:  No Specimen  DISPOSITION OF SPECIMEN:  N/A  COUNTS:  YES  TOURNIQUET:  * No tourniquets in log *  DICTATION: .Dragon Dictation  PLAN OF CARE: Admit to inpatient   PATIENT DISPOSITION:  PACU - hemodynamically stable.   Delay start of Pharmacological VTE agent (>24hrs) due to surgical blood loss or risk of bleeding: yes

## 2019-10-09 NOTE — Anesthesia Procedure Notes (Signed)
Procedure Name: Intubation Date/Time: 10/09/2019 8:25 AM Performed by: Candis Shine, CRNA Pre-anesthesia Checklist: Patient identified, Emergency Drugs available, Suction available and Patient being monitored Patient Re-evaluated:Patient Re-evaluated prior to induction Oxygen Delivery Method: Circle System Utilized Preoxygenation: Pre-oxygenation with 100% oxygen Induction Type: IV induction Ventilation: Mask ventilation without difficulty Laryngoscope Size: Mac and 3 Grade View: Grade I Tube type: Oral Tube size: 7.0 mm Number of attempts: 1 Airway Equipment and Method: Stylet Placement Confirmation: ETT inserted through vocal cords under direct vision,  positive ETCO2 and breath sounds checked- equal and bilateral Secured at: 21 cm Tube secured with: Tape Dental Injury: Teeth and Oropharynx as per pre-operative assessment

## 2019-10-09 NOTE — Progress Notes (Signed)
Postop check  Status post VP shunt.  Patient awake and aware.  Answer some simple questions but speech somewhat blunted.  Patient with some mildly increased right-sided weakness.  Able to follow commands bilaterally.  Patient likely has some bruising of her internal capsule after shunt placement.  She is improved from where she was in the recovery room and I am not terribly worried about significant intracranial hemorrhage.  Plan for support and observation for now.  Mobilize with therapy.

## 2019-10-09 NOTE — Consult Note (Addendum)
Neurology Consultation  Reason for Consult: Code stroke for right-sided weakness Referring Physician: Dr. Trenton Gammon, neurosurgery  CC: Right-sided weakness, altered mental status  History is obtained from: Chart review, patient's daughter over the phone  HPI: Tracie Wiggins is a 74 y.o. 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 Patient unable to provide any reasonable history due to her mentation. The daughter says since her tumor surgery in August, she has been in rehab and requiring 24/7 care.  Hydrocephalus surgery was being done so that it could improve some of her bladder function and the walking which had been severely impacted after the surgery.  She also has history of seizures which also make her much debilitated.  No seizures were noticed in the unit today. She is on Keppra 500 twice daily.  LKW: 3 PM on 10/09/2019 tpa given?: no, outside the window Premorbid modified Rankin scale (mRS) 4  ROS: Unable to ascertain due to altered mentation  Past Medical History:  Diagnosis Date  . Allergy   . Anxiety   . Arthritis   . Cataract   . Degenerative disease of nervous system ([place])   . Depression   . GERD (gastroesophageal reflux disease)   . Headache   . Hydrocephalus ([place])   . Hyperlipidemia   . Hypertension   . Osteoporosis   . Overactive bladder   . Shingles    twice   Family History  Problem Relation Age of Onset  . COPD Mother   . Stroke Mother   . Heart disease Father   . Heart attack Father   . Heart disease Sister   . Diabetes Sister   . Heart attack Sister   .  Stroke Sister   . Healthy Daughter    Social History:   reports that she has never smoked. She has never used smokeless tobacco. She reports that she does not drink alcohol and does not use drugs.  Medications  Current Facility-Administered Medications:  .  acetaminophen (TYLENOL) tablet 650 mg, 650 mg, Oral, Q4H PRN **OR** acetaminophen (TYLENOL) suppository 650 mg, 650 mg, Rectal, Q4H PRN, Earnie Larsson, MD .  darifenacin (ENABLEX) 24 hr tablet 7.5 mg, 7.5 mg, Oral, Daily, Pool, Henry, MD .  HYDROcodone-acetaminophen (NORCO/VICODIN) 5-325 MG per tablet 1 tablet, 1 tablet, Oral, Q6H PRN, Earnie Larsson, MD .  labetalol (NORMODYNE) injection 10-40 mg, 10-40 mg, Intravenous, Q10 min PRN, Earnie Larsson, MD .  levETIRAcetam (KEPPRA) tablet 500 mg, 500 mg, Oral, BID, Pool, Henry, MD .  ondansetron (ZOFRAN) tablet 4 mg, 4 mg, Oral, Q4H PRN **OR** ondansetron (ZOFRAN) injection 4 mg, 4 mg, Intravenous, Q4H PRN, Pool, Mallie Mussel, MD .  pantoprazole (PROTONIX) EC tablet 40 mg, 40 mg, Oral, Daily, Pool, Henry, MD .  polyethylene glycol (MIRALAX / GLYCOLAX) packet 17 g, 17 g, Oral, BID, Pool, Henry, MD .  promethazine (PHENERGAN) tablet 12.5-25 mg, 12.5-25 mg, Oral, Q4H PRN, Earnie Larsson, MD .  sertraline (ZOLOFT) tablet 12.5 mg, 12.5 mg, Oral, Daily PRN, Earnie Larsson, MD .  vancomycin (VANCOCIN) IVPB 1000 mg/200 mL premix, 1,000 mg, Intravenous, Q12H, Earnie Larsson, MD  Exam:  Current vital signs: BP (!) 161/96 (BP Location: Right Arm)   Pulse (!) 103   Temp 97.6 F (36.4 C) (Oral)   Resp 16   Ht 5\' 3"  (1.6 m)   Wt 90.8 kg   SpO2 95%   BMI 35.46 kg/m  Vital signs in last 24 hours: Temp:  [96.6 F (35.9 C)-98.3 F (36.8 C)] 97.6 F (36.4 C) (10/08 2003) Pulse Rate:  [62-103] 103 (10/08 2003) Resp:  [12-18] 16 (10/08 1642) BP: (122-184)/(78-101) 161/96 (10/08 2003) SpO2:  [91 %-99 %] 95 % (10/08 1642) Weight:  [90.8 kg] 90.8 kg (10/08 1229) General: She is awake alert in no acute distress HEENT:  Normocephalic, sequela of craniectomies unchanged from before. Lungs: Clear Cardiovascular: Regular rhythm Abdomen soft nondistended nontender Extremities warm well perfused Neurological exam She awake alert oriented to self. She was unable to tell the correct age but was able to say the month is September. Speech is dysarthric She also has some aphasia-unable to repeat but able to follow simple commands, also poor attention concentration unable to name objects. Cranial: Pupils are equal round react light, her extraocular movement exam reveals no frank restriction but she does have a leftward gaze preference, she does not blink to threat from the right, she also has right lower facial weakness at rest which disappears when she smiles. Motor exam: Right upper extremity is 1-2/5, right lower extremity is 2-3/5, left upper extremity is 4+/5, left lower extremity is 2-3/5. Sensory exam: Diminished on the right in comparison to the left and she also extinguishes on double simultaneous stimulation. Coordination: Difficult to assess given her mentation NIHSS 1a Level of Conscious.: 0 1b LOC Questions: 1 1c LOC Commands: 0 2 Best Gaze: 1 3 Visual: 2 4 Facial Palsy: 1 5a Motor Arm - left: 0 5b Motor Arm - Right: 2 6a Motor Leg - Left: 2 6b Motor Leg - Right: 3 7 Limb Ataxia: 0 8 Sensory: 0 9 Best Language: 1 10 Dysarthria: 1 11 Extinct. and Inatten.: 2 TOTAL: 16  Labs I have reviewed labs in epic and the results pertinent to this consultation are:  CBC    Component Value Date/Time   WBC 7.3 10/09/2019 0701   RBC 3.95 10/09/2019 0701   HGB 11.3 (L) 10/09/2019 0701   HGB 13.8 04/06/2019 1452   HCT 38.1 10/09/2019 0701   HCT 40.9 04/06/2019 1452   PLT 233 10/09/2019 0701   PLT 333 04/06/2019 1452   MCV 96.5 10/09/2019 0701   MCV 95 04/06/2019 1452   MCH 28.6 10/09/2019 0701   MCHC 29.7 (L) 10/09/2019 0701   RDW 14.4 10/09/2019 0701   RDW 13.4 04/06/2019 1452   LYMPHSABS 2.7  10/09/2019 0701   LYMPHSABS 2.2 04/06/2019 1452   MONOABS 0.6 10/09/2019 0701   EOSABS 0.2 10/09/2019 0701   EOSABS 0.1 04/06/2019 1452   BASOSABS 0.1 10/09/2019 0701   BASOSABS 0.1 04/06/2019 1452   CMP     Component Value Date/Time   NA 141 10/09/2019 0701   NA 145 (H) 04/06/2019 1452   K 3.8 10/09/2019 0701   CL 106 10/09/2019 0701   CO2 26 10/09/2019 0701   GLUCOSE 84 10/09/2019 0701   BUN 8 10/09/2019 0701   BUN 18 04/06/2019 1452   CREATININE 0.84 10/09/2019 0701   CREATININE 0.76 06/13/2012 1552   CALCIUM 9.0 10/09/2019 0701   PROT 6.9 04/29/2019 1745   PROT 6.6 04/06/2019 1452   ALBUMIN 3.8 04/29/2019 1745   ALBUMIN  4.4 04/06/2019 1452   AST 20 04/29/2019 1745   ALT 19 04/29/2019 1745   ALKPHOS 112 04/29/2019 1745   BILITOT 1.0 04/29/2019 1745   BILITOT 0.5 04/06/2019 1452   GFRNONAA >60 10/09/2019 0701   GFRNONAA 81 06/13/2012 1552   GFRAA 59 (L) 08/20/2019 0500   GFRAA >89 06/13/2012 1552   Imaging I have reviewed the images obtained:  CT-scan of the brain-interval placement of VP shunt through left posterior approach with pneumocephalus around the track.  No dense vessels in the anterior circulation.  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 Worsening right-sided weakness, leftward gaze preference, right-sided neglect were noted. On my examination, her symptoms are consistent with a left cerebral dysfunction and an NIH stroke scale of 16. Unfortunately she is outside the window for IV TPA and also had the VP shunt placement today which makes her ineligible for TPA. Her poor baseline modified Rankin score precludes EVT. Other considerations can also be seizures that she has a history of seizures and has had recent  surgery.   Impression: Strokelike symptoms with differentials that include: -New acute stroke -Seizures -Toxic metabolic encephalopathy  Recommendations: -Stat CT angio and perfusion-although she would not be a candidate for intervention, this might give [place] some answers about whether this is more likely a stroke or a seizure. -Seizure precautions -I would load her with Keppra 2 g IV x1 and increase the baseline Keppra to 750 twice daily. -Routine EEG in the morning -Continue frequent neurochecks -MRI brain without contrast when possible-unclear when it might be okay from a shunt placement perspective.  Can be checked in the morning. -Check CBC, BMP, urinalysis, ammonia level  Addendum I spoke with on-call neurosurgeon Dr. Venetia Constable who reports that the exam as discussed with his colleague is not much changed and to be expected after the procedure. I planned to d/c imaging orders but it had already been done in the scanner. CTA/P - no LVO. I will still give her the additional load of Keppra -1g but keep her Keppra dosage at 500 twice daily for now. Primary team can consider the EEG in the morning if they do feel that that is appropriate. Neurology will be available as needed.  Please call with questions.  -- Amie Portland, MD [facility] Pager: [phone] If 7pm to 7am, please call on call as listed on AMION.  CRITICAL CARE ATTESTATION Performed by: Amie Portland, MD Total critical care time: 50 minutes Critical care time was exclusive of separately billable procedures and treating other patients and/or supervising APPs/Residents/Students Critical care was necessary to treat or prevent imminent or life-threatening deterioration due to possible acute ischemic stroke versus seizure versus toxic metabolic encephalopathy. This patient is critically ill and at significant risk for neurological worsening and/or death and care requires constant monitoring. Critical care was  time spent personally by me on the following activities: development of treatment plan with patient and/or surrogate as well as nursing, discussions with consultants, evaluation of patient's response to treatment, examination of patient, obtaining history from patient or surrogate, ordering and performing treatments and interventions, ordering and review of laboratory studies, ordering and review of radiographic studies, pulse oximetry, re-evaluation of patient's condition, participation in multidisciplinary rounds and medical decision making of high complexity in the care of this patient.

## 2019-10-09 NOTE — Op Note (Signed)
Date of procedure: 10/09/2019  Date of dictation: Same  Service: Neurosurgery  Preoperative diagnosis: Communicating hydrocephalus  Postoperative diagnosis: Same  Procedure Name: Left occipital ventriculoperitoneal shunt  Surgeon:Gianna Calef A.Francisca Harbuck, M.D.  Asst. Surgeon: Reinaldo Meeker, NP  Anesthesia: General  Indication: 74 year old female status post right-sided craniectomy and resection of large posterior fossa meningioma.  Postop with the patient has done well but has started to decline somewhat.  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  She presents now for VP shunting in hopes of improving her symptoms.  Operative note: After induction of anesthesia, patient positioned supine with head turned toward the right and right shoulder elevated.  Patient's head neck chest and abdomen were prepped and draped sterilely.  An incision was made in the epigastric region just left of midline.  This carried down sharply to the anterior rectus sheath.  Anterior rectus sheath was divided.  The rectus muscle fibers were separated and the posterior rectus sheath was identified.  Posterior rectus sheath was then incised sharply and the preperitoneal space was entered.  Peritoneum was then bluntly entered and the intra-abdominal contents were visualized.  Attention was then placed to the occipital region.  An incision was made in the left occipital region.  This carried down sharply to the pericranium.  Retractor was placed.  A single bur hole was then made using the high-speed drill.  Inferior to the incision a pocket was made for later shunt valve passage.  A shunt passer was then used to pass a Med[employer]onic strata 2 valve and integral distal catheter set at a level 1.0 into position.  The distal catheter was exited through the abdominal wound and the proximal connector was left in the cranial wound.  The dura was  coagulated and cut.  A 9 cm ventricular catheter was then passed into the left lateral ventricle with good return of CSF under high pressure.  The ventricular catheter was then connected to the proximal aspect of the shunt valve using a snap connector.  Good flow of CSF was observed through the distal catheter.  The distal catheter was then passed into the peritoneal space without difficulty.  Wounds were then irrigated and then closed in typical fashion.  Sterile dressings were applied.  No apparent complications.  Patient tolerated procedure well and she returns to the recovery room postop.

## 2019-10-10 LAB — URINALYSIS, ROUTINE W REFLEX MICROSCOPIC
Bacteria, UA: NONE SEEN
Bilirubin Urine: NEGATIVE
Glucose, UA: NEGATIVE mg/dL
Ketones, ur: NEGATIVE mg/dL
Nitrite: NEGATIVE
Protein, ur: NEGATIVE mg/dL
Specific Gravity, Urine: >1.046 — ABNORMAL HIGH (ref 1.005–1.030)
pH: 7 (ref 5.0–8.0)

## 2019-10-10 LAB — COMPREHENSIVE METABOLIC PANEL
ALT: 16 U/L (ref 0–44)
AST: 21 U/L (ref 15–41)
Albumin: 3 g/dL — ABNORMAL LOW (ref 3.5–5.0)
Alkaline Phosphatase: 91 U/L (ref 38–126)
Anion gap: 13 (ref 5–15)
BUN: 9 mg/dL (ref 8–23)
CO2: 22 mmol/L (ref 22–32)
Calcium: 8.9 mg/dL (ref 8.9–10.3)
Chloride: 102 mmol/L (ref 98–111)
Creatinine, Ser: 0.81 mg/dL (ref 0.44–1.00)
GFR, Estimated: >60 mL/min (ref >60)
Glucose, Bld: 120 mg/dL — ABNORMAL HIGH (ref 70–99)
Potassium: 4.1 mmol/L (ref 3.5–5.1)
Sodium: 137 mmol/L (ref 135–145)
Total Bilirubin: 0.2 mg/dL — ABNORMAL LOW (ref 0.3–1.2)
Total Protein: 5.9 g/dL — ABNORMAL LOW (ref 6.5–8.1)

## 2019-10-10 LAB — AMMONIA: Ammonia: 20 umol/L (ref 9–35)

## 2019-10-10 MED ORDER — CHLORHEXIDINE GLUCONATE CLOTH 2 % EX PADS
6.0000 | MEDICATED_PAD | Freq: Every day | CUTANEOUS | Status: DC
  Administered 2019-10-10: 6 via TOPICAL

## 2019-10-10 MED ORDER — LEVETIRACETAM 100 MG/ML PO SOLN: 500.0000 mg | Freq: Two times a day (BID) | ORAL | Status: DC

## 2019-10-10 MED ORDER — LEVETIRACETAM 100 MG/ML PO SOLN
500.0000 mg | Freq: Two times a day (BID) | ORAL | Status: DC
  Administered 2019-10-10 – 2019-10-15 (×10): 500 mg via ORAL
  Filled 2019-10-10 (×11): qty 5

## 2019-10-10 NOTE — Progress Notes (Signed)
Pt refusing to take morning medications. Will not take sips of water and says "why" repeatedly.   Justice Rocher, RN

## 2019-10-10 NOTE — Progress Notes (Signed)
Neurosurgery Service Progress Note  Subjective: Post-op with R sided weakness / partial aphasia, improved steadily throughout the night  Objective: Vitals:   10/09/19 1642 10/09/19 2003 10/10/19 0011 10/10/19 0413  BP: (!) 156/98 (!) 161/96 (!) 138/92 (!) 166/88  Pulse: 100 (!) 103 89 98  Resp: 16 18  16   Temp: 98.3 F (36.8 C) 97.6 F (36.4 C)  97.9 F (36.6 C)  TempSrc: Oral Oral  Oral  SpO2: 95%   99%  Weight:      Height:       Temp (24hrs), Avg:97.6 F (36.4 C), Min:96.6 F (35.9 C), Max:98.3 F (36.8 C)  CBC Latest Ref Rng & Units 10/09/2019 10/09/2019 08/20/2019  WBC 4.0 - 10.5 K/uL 13.1(H) 7.3 13.6(H)  Hemoglobin 12.0 - 15.0 g/dL 12.8 11.3(L) 8.5(L)  Hematocrit 36 - 46 % 41.3 38.1 27.4(L)  Platelets 150 - 400 K/uL 270 233 225   BMP Latest Ref Rng & Units 10/09/2019 10/09/2019 08/20/2019  Glucose 70 - 99 mg/dL 120(H) 84 125(H)  BUN 8 - 23 mg/dL 9 8 14   Creatinine 0.44 - 1.00 mg/dL 0.81 0.84 1.06(H)  BUN/Creat Ratio 12 - 28 - - -  Sodium 135 - 145 mmol/L 137 141 140  Potassium 3.5 - 5.1 mmol/L 4.1 3.8 4.0  Chloride 98 - 111 mmol/L 102 106 107  CO2 22 - 32 mmol/L 22 26 26   Calcium 8.9 - 10.3 mg/dL 8.9 9.0 8.6(L)    Intake/Output Summary (Last 24 hours) at 10/10/2019 0444 Last data filed at 10/09/2019 1100 Gross per 24 hour  Intake 700 ml  Output 520 ml  Net 180 ml    Current Facility-Administered Medications:  .  acetaminophen (TYLENOL) tablet 650 mg, 650 mg, Oral, Q4H PRN **OR** acetaminophen (TYLENOL) suppository 650 mg, 650 mg, Rectal, Q4H PRN, Earnie Larsson, MD .  darifenacin (ENABLEX) 24 hr tablet 7.5 mg, 7.5 mg, Oral, Daily, Pool, Mallie Mussel, MD .  HYDROcodone-acetaminophen (NORCO/VICODIN) 5-325 MG per tablet 1 tablet, 1 tablet, Oral, Q6H PRN, Earnie Larsson, MD .  labetalol (NORMODYNE) injection 10-40 mg, 10-40 mg, Intravenous, Q10 min PRN, Earnie Larsson, MD .  levETIRAcetam (KEPPRA) tablet 500 mg, 500 mg, Oral, BID, Amie Portland, MD .  ondansetron (ZOFRAN) tablet 4  mg, 4 mg, Oral, Q4H PRN **OR** ondansetron (ZOFRAN) injection 4 mg, 4 mg, Intravenous, Q4H PRN, Pool, Mallie Mussel, MD .  pantoprazole (PROTONIX) EC tablet 40 mg, 40 mg, Oral, Daily, Pool, Henry, MD .  polyethylene glycol (MIRALAX / GLYCOLAX) packet 17 g, 17 g, Oral, BID, Pool, Henry, MD .  promethazine (PHENERGAN) tablet 12.5-25 mg, 12.5-25 mg, Oral, Q4H PRN, Earnie Larsson, MD .  sertraline (ZOLOFT) tablet 12.5 mg, 12.5 mg, Oral, Daily PRN, Earnie Larsson, MD .  vancomycin (VANCOCIN) IVPB 1000 mg/200 mL premix, 1,000 mg, Intravenous, Q12H, Pool, Mallie Mussel, MD, Last Rate: 200 mL/hr at 10/10/19 0046, 1,000 mg at 10/10/19 0046   Physical Exam: AOx2, PERRL, gaze neutral with full EOM, mild R UMN pattern facial weakness, speaking in short sentences with mild dysarthria, knows she's in the hospital but not sure why. FCx4 with 4-/5 strength on R, 5/5 on L Incisions c/d/i Pseudomeningocele soft  Assessment & Plan: 74 y.o. woman s/p L VPS placement, post-op right sided weakness. Post-op CTH w/ pneumocephalus along L PLIC, catheter in position / intraventricular.  -Exam improving, continue supportive care -d/c foley tomorrow -PT/OT -CTA with possible L occipital dural AVF, not an urgent issue, will workup further as an outpatient if needed  Tracie Wiggins A  Tracie Wiggins  10/10/19 4:44 AM

## 2019-10-10 NOTE — Progress Notes (Signed)
Pt oriented to self, and calling out "my baby" over and over. Continues to have right sided weakness, per night shift this is not new. Dr. Zada Finders called to confirm no change in pt status and to pass on that pt's daughter Ralph Leyden is requesting an update.   Justice Rocher, RN

## 2019-10-10 NOTE — Progress Notes (Signed)
Tempestt.Lolling - MD at bedside. Discussed starting cardiac monitoring and leaving foley in place at this time.

## 2019-10-10 NOTE — Progress Notes (Addendum)
Pt noted to have visual hallucinations. Pt again asking about "my baby". And when asked said that the baby is in the room and in the bed with her. Pt has been drowsy throughout the day, but easily awakens to speech. Message left with answering service for Tracie Wiggins.   1544: Received a call back from Wiggins and filled them in on pt's symptoms. Will continue to monitor.  Justice Rocher, RN

## 2019-10-11 NOTE — Progress Notes (Signed)
Neurosurgery Service Progress Note  Subjective: No acute events overnight. No new complaints this morning  Objective: Vitals:   10/10/19 1948 10/10/19 2305 10/11/19 0347 10/11/19 1146  BP: (!) 159/74 113/65 126/77 110/73  Pulse: 80 80 79 90  Resp: 17 14 17 18   Temp: 98.1 F (36.7 C) 98.5 F (36.9 C) 98.2 F (36.8 C) 98.3 F (36.8 C)  TempSrc: Oral Oral Oral Oral  SpO2: 98% 97% 95% 94%  Weight:      Height:       Temp (24hrs), Avg:98.3 F (36.8 C), Min:98.1 F (36.7 C), Max:98.5 F (36.9 C)  CBC Latest Ref Rng & Units 10/09/2019 10/09/2019 08/20/2019  WBC 4.0 - 10.5 K/uL 13.1(H) 7.3 13.6(H)  Hemoglobin 12.0 - 15.0 g/dL 12.8 11.3(L) 8.5(L)  Hematocrit 36 - 46 % 41.3 38.1 27.4(L)  Platelets 150 - 400 K/uL 270 233 225   BMP Latest Ref Rng & Units 10/09/2019 10/09/2019 08/20/2019  Glucose 70 - 99 mg/dL 120(H) 84 125(H)  BUN 8 - 23 mg/dL 9 8 14   Creatinine 0.44 - 1.00 mg/dL 0.81 0.84 1.06(H)  BUN/Creat Ratio 12 - 28 - - -  Sodium 135 - 145 mmol/L 137 141 140  Potassium 3.5 - 5.1 mmol/L 4.1 3.8 4.0  Chloride 98 - 111 mmol/L 102 106 107  CO2 22 - 32 mmol/L 22 26 26   Calcium 8.9 - 10.3 mg/dL 8.9 9.0 8.6(L)    Intake/Output Summary (Last 24 hours) at 10/11/2019 1623 Last data filed at 10/10/2019 2315 Gross per 24 hour  Intake --  Output 650 ml  Net -650 ml    Current Facility-Administered Medications:  .  acetaminophen (TYLENOL) tablet 650 mg, 650 mg, Oral, Q4H PRN, 650 mg at 10/11/19 0550 **OR** acetaminophen (TYLENOL) suppository 650 mg, 650 mg, Rectal, Q4H PRN, Earnie Larsson, MD .  Chlorhexidine Gluconate Cloth 2 % PADS 6 each, 6 each, Topical, Daily, Judith Part, MD, 6 each at 10/10/19 1520 .  darifenacin (ENABLEX) 24 hr tablet 7.5 mg, 7.5 mg, Oral, Daily, Pool, Mallie Mussel, MD, 7.5 mg at 10/11/19 0841 .  HYDROcodone-acetaminophen (NORCO/VICODIN) 5-325 MG per tablet 1 tablet, 1 tablet, Oral, Q6H PRN, Earnie Larsson, MD .  labetalol (NORMODYNE) injection 10-40 mg, 10-40 mg,  Intravenous, Q10 min PRN, Earnie Larsson, MD .  levETIRAcetam (KEPPRA) 100 MG/ML solution 500 mg, 500 mg, Oral, BID, Earnie Larsson, MD, 500 mg at 10/11/19 0843 .  ondansetron (ZOFRAN) tablet 4 mg, 4 mg, Oral, Q4H PRN **OR** ondansetron (ZOFRAN) injection 4 mg, 4 mg, Intravenous, Q4H PRN, Pool, Henry, MD .  pantoprazole (PROTONIX) EC tablet 40 mg, 40 mg, Oral, Daily, Pool, Mallie Mussel, MD, 40 mg at 10/11/19 0842 .  polyethylene glycol (MIRALAX / GLYCOLAX) packet 17 g, 17 g, Oral, BID, Earnie Larsson, MD, 17 g at 10/11/19 0840 .  promethazine (PHENERGAN) tablet 12.5-25 mg, 12.5-25 mg, Oral, Q4H PRN, Earnie Larsson, MD .  sertraline (ZOLOFT) tablet 12.5 mg, 12.5 mg, Oral, Daily PRN, Earnie Larsson, MD   Physical Exam: AOx2,PERRL, FCx4 with 4/5 strength on R, 5/5 on left, gaze neutral with full EOM, right sided facial weakness, speaking in simple sentences with mild dysarthria. Pt is more talkative today Incision c/d/i   Assessment & Plan: 74 y.o. female s/p L VPS placement, post-op right sided weakness. Post-op CTH w/ pneumocephalus along L PLIC, catheter in position / intraventricular.  -Exam improving, continue supportive care -PT/OT -CTA with possible L occipital dural AVF, not an urgent issue, will workup further as an outpatient  if needed  Osie Cheeks, NP 10/11/19 4:23 PM

## 2019-10-12 ENCOUNTER — Encounter (HOSPITAL_COMMUNITY): Payer: Self-pay | Admitting: Neurosurgery

## 2019-10-12 NOTE — Care Management Important Message (Signed)
Important Message  Patient Details  Name: Tracie Wiggins MRN: [id number] Date of Birth: 01/25/1945   Medicare Important Message Given:  Yes     Orbie Pyo 10/12/2019, 12:35 PM

## 2019-10-12 NOTE — Progress Notes (Signed)
Postop day 3.  Patient making slow steady progress.  Remains somewhat somnolent but awakens and answers questions appropriately.  Her right-sided weakness is much improved although still present.  Her wound is healing well.  Her abdomen is soft.  Her pseudomeningocele in her right occipital region has completely resolved.  Status post left occipital VP shunt with some transient right-sided weakness secondary to catheter passage.  Continue efforts at rehab and mobilization.  Push p.o. fluids.  Work toward discharge back to her skilled nursing facility.

## 2019-10-12 NOTE — Progress Notes (Signed)
Foley removed 10/10 1445. Pt voided 40 mL @2030 . Bladder scan @0225  showed 20 mL in bladder. Pt has had poor intake and no IV fluids running. On call provider notified. Provider advised RN to in and out pt and encourage fluid intake. 75 mL of urine emptied from bladder. Will continue to encourage fluid intake.

## 2019-10-13 NOTE — Progress Notes (Signed)
Overall stable.  Still with some right-sided weakness and dysarthria.  Awake and more interactive today.  Eating better.  Complains of some headache.  No abdominal pain.  Overall progressing slowly.  Patient's right-sided weakness will hopefully improve as the swelling goes down from shunt placement.  Continue efforts at therapy and mobilization.  Patient likely to be stable for discharge to skilled nursing facility soon.

## 2019-10-13 NOTE — Consult Note (Signed)
   [facility] Inpatient Consult   10/13/2019  TASIA LIZ 1945-05-30 [id number]   [place] [facility] [ACO] Patient: Annalee Genta [facility] Embedded Provider [facility], with [place], FNP  Patient was screened for [place] Management services. Patient will have the transition of care call conducted by the primary care provider. This patient is also in an Embedded practice which has a chronic disease management Embedded Care Management team.  Plan: Will send a notification sent to the [facility] Management team follows for TOC and EMMI follow ups.   Please contact for further questions,  Natividad Brood, RN BSN [facility] Liaison  [phone] business mobile phone Toll free office [phone]  Fax number: [phone] [email or web address]

## 2019-10-13 NOTE — Evaluation (Signed)
Physical Therapy Evaluation Patient Details Name: Tracie Wiggins MRN: [id number] DOB: Aug 10, 1945 Today's Date: 10/13/2019   History of Present Illness  74 yo female s/p L occipital ventriculoperitoneal shunt, recent admission 08/2019 with R suboccipital crani and resection of large posterior fossa meningioma. 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 PMH includes OA, depression, OP, HTN, HLD.  Clinical Impression   Pt presents with RUE/LE profound weakness, impaired safety awareness and orientation (unsure of pt baseline), difficulty performing bed mobility and scooting tasks, and decreased activity tolerance vs baseline. Pt to benefit from acute PT to address deficits. Pt required mod-max assist for bed mobility and lateral scooting at EOB, unable to progress to standing due to pt difficulty with command following, weakness, and fatigue from sitting EOB x5 minutes. PT recommending D/c to SNF post-acutely. PT to progress mobility as tolerated, and will continue to follow acutely.      Follow Up Recommendations SNF;Supervision/Assistance - 24 hour    Equipment Recommendations  None recommended by PT    Recommendations for Other Services       Precautions / Restrictions Precautions Precautions: Fall Restrictions Weight Bearing Restrictions: No      Mobility  Bed Mobility Overal bed mobility: Needs Assistance Bed Mobility: Supine to Sit;Sit to Supine     Supine to sit: Mod assist;HOB elevated Sit to supine: Max assist;HOB elevated   General bed mobility comments: Mod-max assist for supine<>sit for trunk and LE management, scooting to/from EOB, boost up in bed with use of bed pads upon return to supine.  Transfers Overall transfer level: Needs assistance Equipment used: Rolling walker (2 wheeled) Transfers: Lateral/Scoot Transfers          Lateral/Scoot Transfers: Max assist General transfer comment: Max assist for attempted  lateral scooting to [facility], assist for weight shifting, trunk translation, hip translation.  Ambulation/Gait             General Gait Details: unable  Stairs            Wheelchair Mobility    Modified Rankin (Stroke Patients Only)       Balance Overall balance assessment: Needs assistance Sitting-balance support: Single extremity supported Sitting balance-Leahy Scale: Fair Sitting balance - Comments: able to sit EOB without PT assist statically, requires min assist to correct posterior bias with attempted UE/LE movement Postural control: Posterior lean     Standing balance comment: unable to assess this day                             Pertinent Vitals/Pain Pain Assessment: Faces Faces Pain Scale: Hurts a little bit Pain Location: back of head, L frontal region Pain Descriptors / Indicators: Sore Pain Intervention(s): Limited activity within patient's tolerance;Monitored during session    Home Living Family/patient expects to be discharged to:: Private residence Living Arrangements: Spouse/significant other ("my ex, Consulting civil engineer") Available Help at Discharge: Family Type of Home: House Home Access: Stairs to enter Entrance Stairs-Rails: Right;Left;Can reach both Entrance Stairs-Number of Steps: 3 Home Layout: One level Home Equipment: Shower seat;Grab bars - tub/shower;Hand held Tourist information centre manager - 4 wheels;Walker - 2 wheels;Cane - quad;Wheelchair - manual      Prior Function Level of Independence: Needs assistance   Gait / Transfers Assistance Needed: pt reports walking with RW, using w/c  ADL's / Homemaking Assistance Needed: Has an aide that comes over "sometimes, every 2 or 3 weeks". Pt states her aide cleans her  home, helps pt bathe, cook. Pt gets meals on wheels  Comments: suspect history given is incorrect, pt is a poor historian     Hand Dominance   Dominant Hand: Right    Extremity/Trunk Assessment   Upper Extremity Assessment Upper  Extremity Assessment: RUE deficits/detail RUE Deficits / Details: 2-/5 elbow flexion, wrist flex/ext observed. 0/5 shoulder flexion RUE Coordination: decreased gross motor;decreased fine motor    Lower Extremity Assessment Lower Extremity Assessment: RLE deficits/detail;Generalized weakness RLE Deficits / Details: 1/5 knee flex/ext, hip flex. Full PROM    Cervical / Trunk Assessment Cervical / Trunk Assessment: Kyphotic  Communication   Communication: No difficulties  Cognition Arousal/Alertness: Awake/alert Behavior During Therapy: WFL for tasks assessed/performed Overall Cognitive Status: Impaired/Different from baseline Area of Impairment: Orientation;Attention;Memory;Following commands;Safety/judgement;Problem solving                 Orientation Level: Disoriented to;Time;Place;Situation (pt states the year is 2003, states "I'm at [employer]", unaware of surgery) Current Attention Level: Sustained Memory: Decreased short-term memory Following Commands: Follows one step commands inconsistently Safety/Judgement: Decreased awareness of safety;Decreased awareness of deficits   Problem Solving: Slow processing;Difficulty sequencing;Requires verbal cues;Requires tactile cues General Comments: Pt pleasantly confused, perseverating on "my ex mike" throughout session      General Comments      Exercises     Assessment/Plan    PT Assessment Patient needs continued PT services  PT Problem List Decreased strength;Decreased mobility;Decreased safety awareness;Decreased balance;Decreased cognition;Decreased knowledge of use of DME;Decreased activity tolerance;Impaired tone;Decreased knowledge of precautions;Decreased coordination;Pain       PT Treatment Interventions DME instruction;Therapeutic activities;Gait training;Therapeutic exercise;Patient/family education;Balance training;Functional mobility training    PT Goals (Current goals can be found in the Care Plan section)   Acute Rehab PT Goals Patient Stated Goal: be with mike PT Goal Formulation: With patient Time For Goal Achievement: 10/27/19 Potential to Achieve Goals: Fair    Frequency Min 3X/week   Barriers to discharge        Co-evaluation               AM-PAC PT "6 Clicks" Mobility  Outcome Measure Help needed turning from your back to your side while in a flat bed without using bedrails?: A Lot Help needed moving from lying on your back to sitting on the side of a flat bed without using bedrails?: A Lot Help needed moving to and from a bed to a chair (including a wheelchair)?: Total Help needed standing up from a chair using your arms (e.g., wheelchair or bedside chair)?: Total Help needed to walk in hospital room?: Total Help needed climbing 3-5 steps with a railing? : Total 6 Click Score: 8    End of Session   Activity Tolerance: Patient limited by fatigue Patient left: in bed;with call bell/phone within reach;with bed alarm set;with SCD's reapplied Nurse Communication: Mobility status PT Visit Diagnosis: Other abnormalities of gait and mobility (R26.89);Other symptoms and signs involving the nervous system (R29.898);Hemiplegia and hemiparesis Hemiplegia - Right/Left: Right Hemiplegia - dominant/non-dominant: Dominant Hemiplegia - caused by: Other cerebrovascular disease    Time: 9381-0175 PT Time Calculation (min) (ACUTE ONLY): 22 min   Charges:   PT Evaluation $PT Eval Low Complexity: 1 Low          Henlee Donovan E, PT Acute Rehabilitation Services Pager [phone]  Office [phone]    Edvardo Honse D Elonda Husky 10/13/2019, 5:39 PM

## 2019-10-14 LAB — BASIC METABOLIC PANEL
Anion gap: 10 (ref 5–15)
BUN: 9 mg/dL (ref 8–23)
CO2: 28 mmol/L (ref 22–32)
Calcium: 8.9 mg/dL (ref 8.9–10.3)
Chloride: 101 mmol/L (ref 98–111)
Creatinine, Ser: 0.99 mg/dL (ref 0.44–1.00)
GFR, Estimated: 56 mL/min — ABNORMAL LOW (ref >60)
Glucose, Bld: 158 mg/dL — ABNORMAL HIGH (ref 70–99)
Potassium: 3.4 mmol/L — ABNORMAL LOW (ref 3.5–5.1)
Sodium: 139 mmol/L (ref 135–145)

## 2019-10-14 LAB — SARS CORONAVIRUS 2 BY RT PCR (HOSPITAL ORDER, PERFORMED IN ~~LOC~~ HOSPITAL LAB): SARS Coronavirus 2: NEGATIVE

## 2019-10-14 NOTE — TOC Initial Note (Signed)
Transition of Care [facility]) - Initial/Assessment Note    Patient Details  Name: Tracie Wiggins MRN: [id number] Date of Birth: 1945/08/30  Transition of Care [facility]) CM/SW Contact:    Vinie Sill, [place] Phone Number: 10/14/2019, 1:59 PM  Clinical Narrative:                  CSW spoke with the patient's daughter,Tracie Wiggins. CSW introduced self and explained role. Patient's daughter confirmed she was from [facility] and confirm the discharge plan of returning to [facility] once medically stable.   CSW spoke with [place]- confirmed bed and advised of potential discharge. SNF will start insurance authorization today.  RN updated and covid test requested.  Thurmond Butts, MSW, [place] Clinical Social Worker    Expected Discharge Plan: Skilled Nursing Facility Barriers to Discharge: Insurance Authorization   Patient Goals and CMS Choice        Expected Discharge Plan and Services Expected Discharge Plan: [place]       Living arrangements for the past 2 months: [place]                                      Prior Living Arrangements/Services Living arrangements for the past 2 months: [place] Lives with:: Self Patient language and need for interpreter reviewed:: No Do you feel safe going back to the place where you live?: No   going to SNF  Need for Family Participation in Patient Care: Yes (Comment) Care giver support system in place?: Yes (comment)   Criminal Activity/Legal Involvement Pertinent to Current Situation/Hospitalization: No - Comment as needed  Activities of Daily Living Home Assistive Devices/Equipment: Wheelchair, Dentures (specify type) ADL Screening (condition at time of admission) Patient's cognitive ability adequate to safely complete daily activities?: No Is the patient deaf or have difficulty hearing?: No Does the patient have difficulty seeing, even when wearing  glasses/contacts?: No Does the patient have difficulty concentrating, remembering, or making decisions?: Yes Patient able to express need for assistance with ADLs?: Yes Does the patient have difficulty dressing or bathing?: Yes Independently performs ADLs?: No Communication: Independent Dressing (OT): Needs assistance Is this a change from baseline?: Pre-admission baseline Grooming: Needs assistance Is this a change from baseline?: Pre-admission baseline Feeding: Independent Bathing: Needs assistance Is this a change from baseline?: Pre-admission baseline Toileting: Needs assistance Is this a change from baseline?: Pre-admission baseline In/Out Bed: Needs assistance Is this a change from baseline?: Pre-admission baseline Walks in Home: Needs assistance Is this a change from baseline?: Pre-admission baseline Does the patient have difficulty walking or climbing stairs?: Yes Weakness of Legs: Both Weakness of Arms/Hands: Both  Permission Sought/Granted Permission sought to share information with : Family Supports    Share Information with NAME: Bostic,Tracie Wiggins  Permission granted to share info w AGENCY: [place] granted to share info w Relationship: daugter  Permission granted to share info w Contact Information: [phone]  Emotional Assessment   Attitude/Demeanor/Rapport: Unable to Assess Affect (typically observed): Unable to Assess Orientation: : Oriented to Self Alcohol / Substance Use: Not Applicable Psych Involvement: No (comment)  Admission diagnosis:  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 Meningioma ([place]) 08/18/2019  . Brain tumor ([place]) 08/17/2019  . Fall 04/06/2019  . Morbid obesity ([place]) 10/04/2014  . OAB (overactive bladder)  04/07/2014  . Hypertension 12/22/2012  . Hyperlipidemia 06/13/2012  . GERD (gastroesophageal reflux disease)  06/13/2012  . Osteoporosis 06/13/2012  . Depression 06/13/2012   PCP:  Chevis Pretty, FNP Pharmacy:   [place], [address] [place] [address] Phone: [phone] Fax: [phone]     Social Determinants of Health (SDOH) Interventions    Readmission Risk Interventions No flowsheet data found.

## 2019-10-14 NOTE — Evaluation (Signed)
Occupational Therapy Evaluation Patient Details Name: Tracie Wiggins MRN: [id number] DOB: 01-Sep-1945 Today's Date: 10/14/2019    History of Present Illness 74 yo female s/p L occipital ventriculoperitoneal shunt, recent admission 08/2019 with R suboccipital crani and resection of large posterior fossa meningioma. 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 PMH includes OA, depression, OP, HTN, HLD.   Clinical Impression   Patient with functional deficits listed below impacting safety and independence with self care. Per chart patient from [facility], patient with impaired cognition and unable to provide accurate PLOF hx. Patient impaired to orientation, attention, STM, problem solving, safety awareness. Patient with R UE weakness and limited ROM all pivots. Pt max A for bed mobility with cues for sequencing. Initially patient close S seated EOB however fatigues ~5 mins requiring up to mod A due to R lateral + posterior lean. Recommend continued acute OT services in order to facilitate D/C to venue listed below.    Follow Up Recommendations  SNF;Supervision/Assistance - 24 hour    Equipment Recommendations  Other (comment) (defer to next venue)       Precautions / Restrictions Precautions Precautions: Fall Restrictions Weight Bearing Restrictions: No      Mobility Bed Mobility Overal bed mobility: Needs Assistance Bed Mobility: Supine to Sit;Sit to Supine     Supine to sit: Max assist;HOB elevated Sit to supine: Max assist   General bed mobility comments: max cues to sequence bridging to scoot hips over toward EOB, max A for trunk support to sitting. Patient require assist to lift LEs onto bed and max cues to guide trunk to sidelying vs laying straight back onto bed  Transfers                 General transfer comment: did not attempt standing due to x1 assist, max A to sequence and perform scooting up to [place] Overall balance assessment: Needs assistance Sitting-balance support: Feet supported Sitting balance-Leahy Scale: Poor Sitting balance - Comments: patient initially able to maintain static sitting however with increase time patient fatigues leaning R and posteriorly Postural control: Right lateral lean;Posterior lean     Standing balance comment: unable to assess this day                           ADL either performed or assessed with clinical judgement   ADL Overall ADL's : Needs assistance/impaired Eating/Feeding: Set up;Bed level   Grooming: Maximal assistance;Sitting Grooming Details (indicate cue type and reason): due to impaired sitting balance, cognition Upper Body Bathing: Maximal assistance;Sitting   Lower Body Bathing: Total assistance;Sitting/lateral leans   Upper Body Dressing : Maximal assistance;Sitting   Lower Body Dressing: Total assistance;Sitting/lateral leans     Toilet Transfer Details (indicate cue type and reason): did not attempt x1 assist available and patient with poor sitting balance  Toileting- Clothing Manipulation and Hygiene: Total assistance;Bed level         General ADL Comments: patient requiring extensive assistance for self care due to impaired cognition, strength, R UE ROM, balance, safety awareness                  Pertinent Vitals/Pain Pain Assessment: No/denies pain     Hand Dominance Right   Extremity/Trunk Assessment Upper Extremity Assessment Upper Extremity Assessment: RUE deficits/detail;LUE deficits/detail RUE Deficits / Details: 2-/5 elbow flexion, 3/5 wrist flexion/extension, 3/5 grip, 2+/5 digit extension, 2/5 shoulder flexion  RUE Coordination: decreased fine motor;decreased gross motor LUE Deficits / Details: AROM grossly intact, 4-/5 triceps 3+/5 biceps (may have been due to cognition/difficulty following directions) 4-/5 grip   Lower Extremity Assessment Lower Extremity Assessment: Defer to  PT evaluation   Cervical / Trunk Assessment Cervical / Trunk Assessment: Kyphotic   Communication Communication Communication: No difficulties   Cognition Arousal/Alertness: Awake/alert Behavior During Therapy: WFL for tasks assessed/performed Overall Cognitive Status: Impaired/Different from baseline Area of Impairment: Orientation;Attention;Memory;Following commands;Safety/judgement;Problem solving                 Orientation Level: Disoriented to;Place;Time;Situation (month+year incorrect, correct day of week/date, aware had sx) Current Attention Level: Focused Memory: Decreased short-term memory Following Commands: Follows one step commands inconsistently Safety/Judgement: Decreased awareness of safety;Decreased awareness of deficits   Problem Solving: Slow processing;Difficulty sequencing;Requires verbal cues;Requires tactile cues General Comments: difficulty following directions to sequence scooting to [place] expects to be discharged to:: Skilled nursing facility                                 Additional Comments: per chart review patient is a resident at [facility], patient unable to provide accurate history of level of assistance at SNF      Prior Functioning/Environment          Comments: patient is poor historian, unsure of level assist patient received at SNF        OT Problem List: Decreased strength;Decreased range of motion;Decreased activity tolerance;Impaired balance (sitting and/or standing);Decreased coordination;Decreased cognition;Decreased safety awareness;Impaired UE functional use;Obesity      OT Treatment/Interventions: Self-care/ADL training;DME and/or AE instruction;Patient/family education;Balance training;Visual/perceptual remediation/compensation;Therapeutic exercise;Neuromuscular education;Therapeutic activities;Cognitive remediation/compensation    OT Goals(Current goals  can be found in the care plan section) Acute Rehab OT Goals Patient Stated Goal: "Do you know when I can leave here?" OT Goal Formulation: With patient Time For Goal Achievement: 10/28/19 Potential to Achieve Goals: Fair  OT Frequency: Min 2X/week    AM-PAC OT "6 Clicks" Daily Activity     Outcome Measure Help from another person eating meals?: A Little Help from another person taking care of personal grooming?: A Lot Help from another person toileting, which includes using toliet, bedpan, or urinal?: Total Help from another person bathing (including washing, rinsing, drying)?: A Lot Help from another person to put on and taking off regular upper body clothing?: A Lot Help from another person to put on and taking off regular lower body clothing?: Total 6 Click Score: 11   End of Session Nurse Communication: Mobility status  Activity Tolerance: Patient tolerated treatment well Patient left: in bed;with call bell/phone within reach;with bed alarm set  OT Visit Diagnosis: Unsteadiness on feet (R26.81);Other abnormalities of gait and mobility (R26.89);Muscle weakness (generalized) (M62.81);Other symptoms and signs involving the nervous system (R29.898)                Time: 1224-8250 OT Time Calculation (min): 24 min Charges:  OT General Charges $OT Visit: 1 Visit OT Evaluation $OT Eval Moderate Complexity: 1 Mod OT Treatments $Self Care/Home Management : 8-22 mins  Delbert Phenix OT OT pager: [phone]  Rosemary Holms 10/14/2019, 2:31 PM

## 2019-10-14 NOTE — Progress Notes (Signed)
No significant change in status.  Patient awake and mildly confused.  Right-sided weakness persists.  Still with some dysarthria.  Her wounds are clean and dry.  Her abdomen is soft.  Her pseudomeningocele remains decompressed.  She is afebrile.  Her vital signs are stable.  She is voiding and has had a bowel movement.  Continue efforts at therapies and mobilization.  Working toward discharge back to skilled nursing facility.  Patient's right-sided weakness likely to improve gradually over the next few weeks.

## 2019-10-14 NOTE — NC FL2 (Signed)
[place] LEVEL OF CARE SCREENING TOOL     IDENTIFICATION  Patient Name: Tracie Wiggins Birthdate: 1945-01-30 Sex: female Admission Date (Current Location): 10/09/2019  [facility] and [place] Number:  [employer] and Address:  [facility] [place] [address] [place], [place] [address]      Provider Number: [id number]  Attending Physician Name and Address:  Earnie Larsson, MD  Relative Name and Phone Number:  [name], daughter. [phone]    Current Level of Care: Hospital Recommended Level of Care: [place] Prior Approval Number:    Date Approved/Denied:   PASRR Number: [id number]  Discharge Plan: SNF    Current Diagnoses: Patient Active Problem List   Diagnosis Date Noted  . 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 Meningioma ([place]) 08/18/2019  . Brain tumor ([place]) 08/17/2019  . Fall 04/06/2019  . Morbid obesity ([place]) 10/04/2014  . OAB (overactive bladder) 04/07/2014  . Hypertension 12/22/2012  . Hyperlipidemia 06/13/2012  . GERD (gastroesophageal reflux disease) 06/13/2012  . Osteoporosis 06/13/2012  . Depression 06/13/2012    Orientation RESPIRATION BLADDER Height & Weight     Self, Time, Place, Situation  Normal Incontinent, External catheter Weight: 200 lb 2.8 oz (90.8 kg) Height:  5\' 3"  (160 cm)  BEHAVIORAL SYMPTOMS/MOOD NEUROLOGICAL BOWEL NUTRITION STATUS      Incontinent Diet (Please see DC Summary)  AMBULATORY STATUS COMMUNICATION OF NEEDS Skin   Extensive Assist Verbally Surgical wounds (Closed incision on head and abdomen)                       Personal Care Assistance Level of Assistance  Bathing, Feeding, Dressing Bathing Assistance: Limited assistance Feeding assistance: Independent Dressing Assistance: Limited assistance     Functional Limitations Info  Sight, Hearing, Speech Sight Info: Adequate Hearing Info: Adequate Speech Info: Adequate    SPECIAL CARE  FACTORS FREQUENCY  PT (By licensed PT), OT (By licensed OT)     PT Frequency: 5x/week OT Frequency: 5x/week            Contractures Contractures Info: Not present    Additional Factors Info  Code Status, Allergies Code Status Info: Full Allergies Info: Asa (Aspirin), Codeine, Erythromycin, Penicillins, Niacin And Related           Current Medications (10/14/2019):  This is the current hospital active medication list Current Facility-Administered Medications  Medication Dose Route Frequency Provider Last Rate Last Admin  . acetaminophen (TYLENOL) tablet 650 mg  650 mg Oral Q4H PRN Earnie Larsson, MD   650 mg at 10/14/19 0031   Or  . acetaminophen (TYLENOL) suppository 650 mg  650 mg Rectal Q4H PRN Earnie Larsson, MD      . darifenacin (ENABLEX) 24 hr tablet 7.5 mg  7.5 mg Oral Daily Earnie Larsson, MD   7.5 mg at 10/14/19 0903  . HYDROcodone-acetaminophen (NORCO/VICODIN) 5-325 MG per tablet 1 tablet  1 tablet Oral Q6H PRN Earnie Larsson, MD   1 tablet at 10/14/19 0903  . labetalol (NORMODYNE) injection 10-40 mg  10-40 mg Intravenous Q10 min PRN Earnie Larsson, MD      . levETIRAcetam (KEPPRA) 100 MG/ML solution 500 mg  500 mg Oral BID Earnie Larsson, MD   500 mg at 10/14/19 0854  . ondansetron (ZOFRAN) tablet 4 mg  4 mg Oral Q4H PRN Earnie Larsson, MD   4 mg at 10/14/19 0854   Or  . ondansetron (ZOFRAN) injection 4 mg  4  mg Intravenous Q4H PRN Earnie Larsson, MD   4 mg at 10/13/19 0802  . pantoprazole (PROTONIX) EC tablet 40 mg  40 mg Oral Daily Earnie Larsson, MD   40 mg at 10/14/19 0854  . polyethylene glycol (MIRALAX / GLYCOLAX) packet 17 g  17 g Oral BID Earnie Larsson, MD   17 g at 10/14/19 0855  . promethazine (PHENERGAN) tablet 12.5-25 mg  12.5-25 mg Oral Q4H PRN Earnie Larsson, MD      . sertraline (ZOLOFT) tablet 12.5 mg  12.5 mg Oral Daily PRN Earnie Larsson, MD         Discharge Medications: Please see discharge summary for a list of discharge medications.  Relevant Imaging Results:  Relevant  Lab Results:   Additional Information SSN: [place], [place]

## 2019-10-15 DIAGNOSIS — R4182 Altered mental status, unspecified: Secondary | ICD-10-CM | POA: Diagnosis not present

## 2019-10-15 DIAGNOSIS — I700 Atherosclerosis of aorta: Secondary | ICD-10-CM | POA: Diagnosis not present

## 2019-10-15 DIAGNOSIS — R52 Pain, unspecified: Secondary | ICD-10-CM | POA: Diagnosis not present

## 2019-10-15 DIAGNOSIS — I469 Cardiac arrest, cause unspecified: Secondary | ICD-10-CM | POA: Diagnosis not present

## 2019-10-15 DIAGNOSIS — K5900 Constipation, unspecified: Secondary | ICD-10-CM | POA: Diagnosis not present

## 2019-10-15 DIAGNOSIS — R410 Disorientation, unspecified: Secondary | ICD-10-CM | POA: Diagnosis not present

## 2019-10-15 DIAGNOSIS — G910 Communicating hydrocephalus: Secondary | ICD-10-CM | POA: Diagnosis not present

## 2019-10-15 DIAGNOSIS — K219 Gastro-esophageal reflux disease without esophagitis: Secondary | ICD-10-CM | POA: Diagnosis not present

## 2019-10-15 DIAGNOSIS — D329 Benign neoplasm of meninges, unspecified: Secondary | ICD-10-CM | POA: Diagnosis not present

## 2019-10-15 DIAGNOSIS — Z7401 Bed confinement status: Secondary | ICD-10-CM | POA: Diagnosis not present

## 2019-10-15 DIAGNOSIS — G8191 Hemiplegia, unspecified affecting right dominant side: Secondary | ICD-10-CM | POA: Diagnosis not present

## 2019-10-15 DIAGNOSIS — Z982 Presence of cerebrospinal fluid drainage device: Secondary | ICD-10-CM | POA: Diagnosis not present

## 2019-10-15 DIAGNOSIS — R112 Nausea with vomiting, unspecified: Secondary | ICD-10-CM | POA: Diagnosis not present

## 2019-10-15 DIAGNOSIS — R4701 Aphasia: Secondary | ICD-10-CM | POA: Diagnosis not present

## 2019-10-15 DIAGNOSIS — Z9889 Other specified postprocedural states: Secondary | ICD-10-CM | POA: Diagnosis not present

## 2019-10-15 DIAGNOSIS — G919 Hydrocephalus, unspecified: Secondary | ICD-10-CM | POA: Diagnosis not present

## 2019-10-15 DIAGNOSIS — I10 Essential (primary) hypertension: Secondary | ICD-10-CM | POA: Diagnosis not present

## 2019-10-15 DIAGNOSIS — K567 Ileus, unspecified: Secondary | ICD-10-CM | POA: Diagnosis not present

## 2019-10-15 DIAGNOSIS — M2550 Pain in unspecified joint: Secondary | ICD-10-CM | POA: Diagnosis not present

## 2019-10-15 DIAGNOSIS — R0981 Nasal congestion: Secondary | ICD-10-CM | POA: Diagnosis not present

## 2019-10-15 NOTE — Discharge Summary (Signed)
Physician Discharge Summary  Patient ID: Tracie Wiggins MRN: [id number] DOB/AGE: July 11, 1945 74 y.o.  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  Postoperatively her situation was complicated by transient right-sided weakness secondary to catheter passage.  CT scan negative for hemorrhage or stroke.  Patient is gradually improved.  Still with some mild right-sided hemiparesis but this is almost completely resolved.  Speech language and overall functionality have returned to baseline.  Consults:   Significant Diagnostic Studies:   Treatments:   Discharge Exam: Blood pressure 105/68, pulse 83, temperature 98 F (36.7 C), temperature source Oral, resp. rate 15, height 5\' 3"  (1.6 m), weight 90.8 kg, SpO2 99 %. Patient is awake and alert.  She is oriented and appropriate.  Her speech is fluent.  Her judgment insight are intact.  Cranial nerve function with some mild right-sided facial weakness.  Motor examination extremities with some mild right hemiparesis 4+/5.  Remainder of motor exam intact.  Wounds clean dry and intact.  Abdomen soft.  Previous pseudomeningocele completely resolved. Disposition: Discharge disposition: 03-Skilled Nursing Facility        Allergies as of 10/15/2019      Reactions   Asa [aspirin] Anaphylaxis, Nausea Only, Rash   Codeine Anaphylaxis, Rash   Erythromycin Anaphylaxis   Penicillins Anaphylaxis, Hives   Has patient had a PCN reaction causing immediate rash, facial/tongue/throat swelling, SOB or lightheadedness with hypotension: Yes Has patient had a PCN reaction causing severe rash involving mucus membranes or skin necrosis: No Has patient had a PCN reaction  that required hospitalization: No Has patient had a PCN reaction occurring within the last 10 years: No If all of the above answers are "NO", then may proceed with Cephalosporin use.   Niacin And Related Rash      Medication List    STOP taking these medications   methylPREDNISolone 4 MG Tbpk tablet Commonly known as: MEDROL DOSEPAK     TAKE these medications   darifenacin 7.5 MG 24 hr tablet Commonly known as: ENABLEX Take 7.5 mg by mouth daily.   HYDROcodone-acetaminophen 5-325 MG tablet Commonly known as: NORCO/VICODIN Take 1 tablet by mouth every 6 (six) hours as needed for moderate pain.   levETIRAcetam 500 MG tablet Commonly known as: KEPPRA Take 1 tablet (500 mg total) by mouth 2 (two) times daily.   omeprazole 40 MG capsule Commonly known as: PRILOSEC Take 1 capsule (40 mg total) by mouth daily.   polyethylene glycol 17 g packet Commonly known as: MIRALAX / GLYCOLAX Take 17 g by mouth 2 (two) times daily.   sertraline 25 MG tablet Commonly known as: ZOLOFT Take 0.5 tablets (12.5 mg total) by mouth daily as needed (for anxiety). Only takes 0.5 tablet        Signed: Cooper Render Tracie Wiggins 10/15/2019, 9:22 AM

## 2019-10-15 NOTE — TOC Transition Note (Addendum)
Transition of Care [facility]) - CM/SW Discharge Note   Patient Details  Name: Tracie Wiggins MRN: [id number] Date of Birth: [birth year]  Transition of Care [facility]) CM/SW Contact:  Vinie Sill, [place] Phone Number: 10/15/2019, 3:20 PM   Clinical Narrative:     Patient will DC to: [place] Date: 10/15/2019 Family Notified: Lilly,daughter Transport By: Frederich Chick make sure script goes with the patient. Per MD patient is ready for discharge. RN, patient, and facility notified of DC. Discharge Summary sent to facility. RN given number for [phone]. Ambulance transport requested for patient.   Clinical Social Worker signing off.  Thurmond Butts, MSW, LCSWA Clinical Social Worker    Final next level of care: Skilled Nursing Facility Barriers to Discharge: Barriers Resolved   Patient Goals and CMS Choice        Discharge Placement PASRR number recieved: 10/14/19            Patient chooses bed at: [facility] Patient to be transferred to facility by: [place] Name of family member notified: Lilly,daughter Patient and family notified of of transfer: 10/15/19  Discharge Plan and Services                                     Social Determinants of Health (SDOH) Interventions     Readmission Risk Interventions No flowsheet data found.

## 2019-10-15 NOTE — Discharge Instructions (Signed)

## 2019-10-15 NOTE — Progress Notes (Signed)
Report called to J. Teacher, early years/pre at [employer]. Patient belongings packed and D/C paper work in [employer] for [employer]. Dr. Annette Stable will send pain meds via e-script to [facility].

## 2019-10-15 NOTE — Progress Notes (Signed)
Overall looks a little bit better.  More awake and interactive.  Much more animated.  Right-sided weakness improving.  Wounds all look good.  Patient ready for discharge to skilled nursing when bed available.

## 2019-10-15 NOTE — Progress Notes (Signed)
Physical Therapy Treatment Patient Details Name: Tracie Wiggins MRN: [id number] DOB: 1945/06/03 Today's Date: 10/15/2019    History of Present Illness 74 yo female s/p L occipital ventriculoperitoneal shunt, recent admission 08/2019 with R suboccipital crani and resection of large posterior fossa meningioma. 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 PMH includes OA, depression, OP, HTN, HLD.    PT Comments    Pt tired upon arrival to room, but motivated to get OOB to have BM. Pt required max +2 for bed mobility and transfer to and from [facility] with assist of stedy, PT correcting pt R lateral lean at both EOB and in stedy. Pt demonstrating improved RUE use this session, initiating reaching towards stedy and able to hold onto handle when initiating standing. Pt progressing slowly, PT continuing to recommend SNF post-acutely.     Follow Up Recommendations  SNF;Supervision/Assistance - 24 hour     Equipment Recommendations  None recommended by PT    Recommendations for Other Services       Precautions / Restrictions Precautions Precautions: Fall Restrictions Weight Bearing Restrictions: No    Mobility  Bed Mobility Overal bed mobility: Needs Assistance Bed Mobility: Supine to Sit;Sit to Supine     Supine to sit: Max assist;+2 for physical assistance Sit to supine: Max assist;+2 for physical assistance   General bed mobility comments: max +2 for trunk and LE management, PT posterior assist with supine>sit as pt with posterior bias.  Transfers Overall transfer level: Needs assistance Equipment used: Ambulation equipment used Transfers: Sit to/from Stand Sit to Stand: Max assist;+2 physical assistance;From elevated surface         General transfer comment: Max +2 for power up, physical assist for hip extension, hand placement on stedy bar, and steadying upon standing. Once in stedy, pt with R lateral leaning requiring PT assist  to correct. STS x2, from EOB and BSC.  Ambulation/Gait             General Gait Details: unable   Stairs             Wheelchair Mobility    Modified Rankin (Stroke Patients Only)       Balance Overall balance assessment: Needs assistance Sitting-balance support: Feet supported Sitting balance-Leahy Scale: Poor Sitting balance - Comments: posterior and R lateral leaning, requiring at least min assist to correct. Postural control: Right lateral lean;Posterior lean Standing balance support: Bilateral upper extremity supported Standing balance-Leahy Scale: Zero Standing balance comment: max +2                            Cognition Arousal/Alertness: Awake/alert Behavior During Therapy: WFL for tasks assessed/performed Overall Cognitive Status: Impaired/Different from baseline Area of Impairment: Orientation;Attention;Memory;Following commands;Safety/judgement;Problem solving                 Orientation Level: Disoriented to;Place;Time;Situation (month+year incorrect, correct day of week/date, aware had sx) Current Attention Level: Focused Memory: Decreased short-term memory Following Commands: Follows one step commands inconsistently Safety/Judgement: Decreased awareness of safety;Decreased awareness of deficits   Problem Solving: Slow processing;Difficulty sequencing;Requires verbal cues;Requires tactile cues General Comments: pt very pleasantly confused, holding baby doll upon arrival to room. Follows commands with multimodal, short cues.      Exercises      General Comments        Pertinent Vitals/Pain Pain Assessment: Faces Faces Pain Scale: Hurts little more Pain Location: back of head Pain Descriptors / Indicators:  Sore Pain Intervention(s): Limited activity within patient's tolerance;Monitored during session;Repositioned    Home Living                      Prior Function            PT Goals (current goals can now  be found in the care plan section) Acute Rehab PT Goals Patient Stated Goal: rest PT Goal Formulation: With patient Time For Goal Achievement: 10/27/19 Potential to Achieve Goals: Fair Progress towards PT goals: Progressing toward goals    Frequency    Min 3X/week      PT Plan Current plan remains appropriate    Co-evaluation              AM-PAC PT "6 Clicks" Mobility   Outcome Measure  Help needed turning from your back to your side while in a flat bed without using bedrails?: A Lot Help needed moving from lying on your back to sitting on the side of a flat bed without using bedrails?: A Lot Help needed moving to and from a bed to a chair (including a wheelchair)?: Total Help needed standing up from a chair using your arms (e.g., wheelchair or bedside chair)?: Total Help needed to walk in hospital room?: Total Help needed climbing 3-5 steps with a railing? : Total 6 Click Score: 8    End of Session Equipment Utilized During Treatment: Other (comment) (stedy) Activity Tolerance: Patient limited by fatigue Patient left: in bed;with call bell/phone within reach;with bed alarm set;with nursing/sitter in room Nurse Communication: Mobility status PT Visit Diagnosis: Other abnormalities of gait and mobility (R26.89);Other symptoms and signs involving the nervous system (R29.898) Hemiplegia - Right/Left: Right Hemiplegia - dominant/non-dominant: Dominant Hemiplegia - caused by: Unspecified     Time: 8921-1941 PT Time Calculation (min) (ACUTE ONLY): 28 min  Charges:  $Therapeutic Activity: 8-22 mins $Neuromuscular Re-education: 8-22 mins                     Tracie Wiggins E, PT Acute Rehabilitation Services Pager [phone]  Office [phone]    Tracie Wiggins D Tracie Wiggins 10/15/2019, 10:59 AM

## 2019-10-20 DIAGNOSIS — G919 Hydrocephalus, unspecified: Secondary | ICD-10-CM | POA: Diagnosis not present

## 2019-10-20 DIAGNOSIS — K5900 Constipation, unspecified: Secondary | ICD-10-CM | POA: Diagnosis not present

## 2019-10-20 DIAGNOSIS — Z982 Presence of cerebrospinal fluid drainage device: Secondary | ICD-10-CM | POA: Diagnosis not present

## 2019-10-20 DIAGNOSIS — Z9889 Other specified postprocedural states: Secondary | ICD-10-CM | POA: Diagnosis not present

## 2019-10-21 DIAGNOSIS — D329 Benign neoplasm of meninges, unspecified: Secondary | ICD-10-CM | POA: Diagnosis not present

## 2019-10-21 DIAGNOSIS — Z982 Presence of cerebrospinal fluid drainage device: Secondary | ICD-10-CM | POA: Diagnosis not present

## 2019-10-21 DIAGNOSIS — Z9889 Other specified postprocedural states: Secondary | ICD-10-CM | POA: Diagnosis not present

## 2019-10-21 DIAGNOSIS — G919 Hydrocephalus, unspecified: Secondary | ICD-10-CM | POA: Diagnosis not present

## 2019-11-04 DIAGNOSIS — Z982 Presence of cerebrospinal fluid drainage device: Secondary | ICD-10-CM | POA: Diagnosis not present

## 2019-11-04 DIAGNOSIS — R4182 Altered mental status, unspecified: Secondary | ICD-10-CM | POA: Diagnosis not present

## 2019-11-04 DIAGNOSIS — D329 Benign neoplasm of meninges, unspecified: Secondary | ICD-10-CM | POA: Diagnosis not present

## 2019-11-24 ENCOUNTER — Other Ambulatory Visit: Payer: Self-pay | Admitting: *Deleted

## 2019-11-24 NOTE — Patient Outreach (Signed)
[facility]) Care Management  11/24/2019  Tracie Wiggins November 18, 1945 [id number]   Transition of care   Referral Received:11/23/19 Referral source: [employer] notification of discharge  Date of Discharge: 11/19/19 [facility]  Insurance: [employer]   Subjective: Referral received, noted per patient ping patient still remains at [facility] skilled nursing facility.  Placed call to Admissions, spoke with Lenna Sciara she verifies patient remains at [facility] pay for now.  Plan Will close case to [facility] care management patient care is managed by [facility], SNF.    Joylene Draft, RN, BSN  [place] Management Coordinator  [phone]- Mobile [phone]- Toll Free Main Office

## 2019-12-03 DIAGNOSIS — K567 Ileus, unspecified: Secondary | ICD-10-CM | POA: Diagnosis not present

## 2019-12-07 DIAGNOSIS — K567 Ileus, unspecified: Secondary | ICD-10-CM | POA: Diagnosis not present

## 2019-12-08 DIAGNOSIS — R112 Nausea with vomiting, unspecified: Secondary | ICD-10-CM | POA: Diagnosis not present

## 2019-12-08 DIAGNOSIS — R918 Other nonspecific abnormal finding of lung field: Secondary | ICD-10-CM | POA: Diagnosis not present

## 2019-12-08 DIAGNOSIS — R1915 Other abnormal bowel sounds: Secondary | ICD-10-CM | POA: Diagnosis not present

## 2019-12-08 DIAGNOSIS — K567 Ileus, unspecified: Secondary | ICD-10-CM | POA: Diagnosis not present

## 2019-12-09 DIAGNOSIS — J9811 Atelectasis: Secondary | ICD-10-CM | POA: Diagnosis not present

## 2019-12-09 DIAGNOSIS — F339 Major depressive disorder, recurrent, unspecified: Secondary | ICD-10-CM | POA: Diagnosis not present

## 2019-12-09 DIAGNOSIS — K567 Ileus, unspecified: Secondary | ICD-10-CM | POA: Diagnosis not present

## 2019-12-09 DIAGNOSIS — R4182 Altered mental status, unspecified: Secondary | ICD-10-CM | POA: Diagnosis not present

## 2019-12-10 DIAGNOSIS — K567 Ileus, unspecified: Secondary | ICD-10-CM | POA: Diagnosis not present

## 2019-12-14 DIAGNOSIS — K567 Ileus, unspecified: Secondary | ICD-10-CM | POA: Diagnosis not present

## 2019-12-15 DIAGNOSIS — K567 Ileus, unspecified: Secondary | ICD-10-CM | POA: Diagnosis not present

## 2019-12-15 DIAGNOSIS — K5900 Constipation, unspecified: Secondary | ICD-10-CM | POA: Diagnosis not present

## 2019-12-15 DIAGNOSIS — R112 Nausea with vomiting, unspecified: Secondary | ICD-10-CM | POA: Diagnosis not present

## 2019-12-16 DIAGNOSIS — K567 Ileus, unspecified: Secondary | ICD-10-CM | POA: Diagnosis not present

## 2019-12-30 DIAGNOSIS — R1915 Other abnormal bowel sounds: Secondary | ICD-10-CM | POA: Diagnosis not present

## 2019-12-31 DIAGNOSIS — F0390 Unspecified dementia without behavioral disturbance: Secondary | ICD-10-CM | POA: Diagnosis not present

## 2019-12-31 DIAGNOSIS — F339 Major depressive disorder, recurrent, unspecified: Secondary | ICD-10-CM | POA: Diagnosis not present

## 2019-12-31 DIAGNOSIS — R112 Nausea with vomiting, unspecified: Secondary | ICD-10-CM | POA: Diagnosis not present

## 2020-01-03 DIAGNOSIS — Z79899 Other long term (current) drug therapy: Secondary | ICD-10-CM | POA: Diagnosis not present

## 2020-01-05 DIAGNOSIS — R110 Nausea: Secondary | ICD-10-CM | POA: Diagnosis not present

## 2020-01-05 DIAGNOSIS — R42 Dizziness and giddiness: Secondary | ICD-10-CM | POA: Diagnosis not present

## 2020-01-06 DIAGNOSIS — E876 Hypokalemia: Secondary | ICD-10-CM | POA: Diagnosis not present

## 2020-01-06 DIAGNOSIS — K5900 Constipation, unspecified: Secondary | ICD-10-CM | POA: Diagnosis not present

## 2020-01-11 DIAGNOSIS — Z79899 Other long term (current) drug therapy: Secondary | ICD-10-CM | POA: Diagnosis not present

## 2020-01-12 ENCOUNTER — Other Ambulatory Visit (HOSPITAL_COMMUNITY): Payer: Self-pay | Admitting: Student

## 2020-01-12 ENCOUNTER — Other Ambulatory Visit: Payer: Self-pay | Admitting: Student

## 2020-01-12 DIAGNOSIS — E876 Hypokalemia: Secondary | ICD-10-CM | POA: Diagnosis not present

## 2020-01-12 DIAGNOSIS — G910 Communicating hydrocephalus: Secondary | ICD-10-CM

## 2020-01-12 DIAGNOSIS — R630 Anorexia: Secondary | ICD-10-CM | POA: Diagnosis not present

## 2020-01-12 DIAGNOSIS — R634 Abnormal weight loss: Secondary | ICD-10-CM | POA: Diagnosis not present

## 2020-01-13 ENCOUNTER — Encounter (HOSPITAL_COMMUNITY): Payer: Self-pay | Admitting: Emergency Medicine

## 2020-01-13 ENCOUNTER — Emergency Department (HOSPITAL_COMMUNITY): Payer: Medicare HMO

## 2020-01-13 ENCOUNTER — Emergency Department (HOSPITAL_COMMUNITY)
Admission: EM | Admit: 2020-01-13 | Discharge: 2020-01-13 | Disposition: A | Discharge status: Home / Self Care | Payer: Medicare HMO | Attending: Emergency Medicine | Admitting: Emergency Medicine

## 2020-01-13 ENCOUNTER — Ambulatory Visit (HOSPITAL_COMMUNITY): Payer: Medicare HMO

## 2020-01-13 ENCOUNTER — Other Ambulatory Visit: Payer: Self-pay

## 2020-01-13 ENCOUNTER — Emergency Department (HOSPITAL_COMMUNITY)
Admit: 2020-01-13 | Discharge: 2020-01-13 | Disposition: A | Discharge status: Home / Self Care | Payer: Medicare HMO | Attending: Student | Admitting: Student

## 2020-01-13 ENCOUNTER — Encounter (HOSPITAL_COMMUNITY): Payer: Self-pay

## 2020-01-13 DIAGNOSIS — Z982 Presence of cerebrospinal fluid drainage device: Secondary | ICD-10-CM | POA: Diagnosis not present

## 2020-01-13 DIAGNOSIS — K219 Gastro-esophageal reflux disease without esophagitis: Secondary | ICD-10-CM | POA: Insufficient documentation

## 2020-01-13 DIAGNOSIS — I10 Essential (primary) hypertension: Secondary | ICD-10-CM | POA: Diagnosis not present

## 2020-01-13 DIAGNOSIS — R109 Unspecified abdominal pain: Secondary | ICD-10-CM | POA: Diagnosis present

## 2020-01-13 DIAGNOSIS — Z743 Need for continuous supervision: Secondary | ICD-10-CM | POA: Diagnosis not present

## 2020-01-13 DIAGNOSIS — K529 Noninfective gastroenteritis and colitis, unspecified: Secondary | ICD-10-CM | POA: Insufficient documentation

## 2020-01-13 DIAGNOSIS — R197 Diarrhea, unspecified: Secondary | ICD-10-CM | POA: Diagnosis not present

## 2020-01-13 DIAGNOSIS — R0902 Hypoxemia: Secondary | ICD-10-CM | POA: Diagnosis not present

## 2020-01-13 DIAGNOSIS — I959 Hypotension, unspecified: Secondary | ICD-10-CM | POA: Diagnosis not present

## 2020-01-13 DIAGNOSIS — I82401 Acute embolism and thrombosis of unspecified deep veins of right lower extremity: Secondary | ICD-10-CM | POA: Diagnosis not present

## 2020-01-13 DIAGNOSIS — R4182 Altered mental status, unspecified: Secondary | ICD-10-CM | POA: Diagnosis not present

## 2020-01-13 DIAGNOSIS — R404 Transient alteration of awareness: Secondary | ICD-10-CM | POA: Diagnosis not present

## 2020-01-13 DIAGNOSIS — G910 Communicating hydrocephalus: Secondary | ICD-10-CM | POA: Diagnosis not present

## 2020-01-13 DIAGNOSIS — R110 Nausea: Secondary | ICD-10-CM | POA: Diagnosis not present

## 2020-01-13 DIAGNOSIS — Z79899 Other long term (current) drug therapy: Secondary | ICD-10-CM | POA: Diagnosis not present

## 2020-01-13 DIAGNOSIS — R1110 Vomiting, unspecified: Secondary | ICD-10-CM | POA: Diagnosis not present

## 2020-01-13 DIAGNOSIS — I82411 Acute embolism and thrombosis of right femoral vein: Secondary | ICD-10-CM

## 2020-01-13 DIAGNOSIS — G9389 Other specified disorders of brain: Secondary | ICD-10-CM | POA: Diagnosis not present

## 2020-01-13 DIAGNOSIS — Z9889 Other specified postprocedural states: Secondary | ICD-10-CM | POA: Diagnosis not present

## 2020-01-13 DIAGNOSIS — I6782 Cerebral ischemia: Secondary | ICD-10-CM | POA: Diagnosis not present

## 2020-01-13 LAB — CBC WITH DIFFERENTIAL/PLATELET
Abs Immature Granulocytes: 0.08 10*3/uL — ABNORMAL HIGH (ref 0.00–0.07)
Basophils Absolute: 0.1 10*3/uL (ref 0.0–0.1)
Basophils Relative: 0 %
Eosinophils Absolute: 0 10*3/uL (ref 0.0–0.5)
Eosinophils Relative: 0 %
HCT: 44.8 % (ref 36.0–46.0)
Hemoglobin: 13.6 g/dL (ref 12.0–15.0)
Immature Granulocytes: 1 %
Lymphocytes Relative: 16 %
Lymphs Abs: 2.2 10*3/uL (ref 0.7–4.0)
MCH: 28.5 pg (ref 26.0–34.0)
MCHC: 30.4 g/dL (ref 30.0–36.0)
MCV: 93.7 fL (ref 80.0–100.0)
Monocytes Absolute: 0.9 10*3/uL (ref 0.1–1.0)
Monocytes Relative: 6 %
Neutro Abs: 10.8 10*3/uL — ABNORMAL HIGH (ref 1.7–7.7)
Neutrophils Relative %: 77 %
Platelets: 299 10*3/uL (ref 150–400)
RBC: 4.78 MIL/uL (ref 3.87–5.11)
RDW: 20.9 % — ABNORMAL HIGH (ref 11.5–15.5)
WBC: 14 10*3/uL — ABNORMAL HIGH (ref 4.0–10.5)
nRBC: 0 % (ref 0.0–0.2)

## 2020-01-13 LAB — LIPASE, BLOOD: Lipase: 16 U/L (ref 11–51)

## 2020-01-13 LAB — COMPREHENSIVE METABOLIC PANEL
ALT: 17 U/L (ref 0–44)
AST: 25 U/L (ref 15–41)
Albumin: 2.6 g/dL — ABNORMAL LOW (ref 3.5–5.0)
Alkaline Phosphatase: 110 U/L (ref 38–126)
Anion gap: 13 (ref 5–15)
BUN: 16 mg/dL (ref 8–23)
CO2: 22 mmol/L (ref 22–32)
Calcium: 8.2 mg/dL — ABNORMAL LOW (ref 8.9–10.3)
Chloride: 106 mmol/L (ref 98–111)
Creatinine, Ser: 0.84 mg/dL (ref 0.44–1.00)
GFR, Estimated: >60 mL/min (ref >60)
Glucose, Bld: 113 mg/dL — ABNORMAL HIGH (ref 70–99)
Potassium: 3.5 mmol/L (ref 3.5–5.1)
Sodium: 141 mmol/L (ref 135–145)
Total Bilirubin: 1 mg/dL (ref 0.3–1.2)
Total Protein: 5.7 g/dL — ABNORMAL LOW (ref 6.5–8.1)

## 2020-01-13 LAB — URINALYSIS, ROUTINE W REFLEX MICROSCOPIC
Bilirubin Urine: NEGATIVE
Glucose, UA: NEGATIVE mg/dL
Hgb urine dipstick: NEGATIVE
Ketones, ur: NEGATIVE mg/dL
Nitrite: NEGATIVE
Protein, ur: NEGATIVE mg/dL
Specific Gravity, Urine: 1.044 — ABNORMAL HIGH (ref 1.005–1.030)
pH: 6 (ref 5.0–8.0)

## 2020-01-13 MED ORDER — SODIUM CHLORIDE 0.9 % IV BOLUS
1000.0000 mL | Freq: Once | INTRAVENOUS | Status: AC
  Administered 2020-01-13: 1000 mL via INTRAVENOUS

## 2020-01-13 MED ORDER — RIVAROXABAN (XARELTO) VTE STARTER PACK (15 & 20 MG): ORAL_TABLET | ORAL | 0 refills | Status: DC

## 2020-01-13 MED ORDER — LOPERAMIDE HCL 2 MG PO CAPS: 2.0000 mg | ORAL_CAPSULE | Freq: Four times a day (QID) | ORAL | 0 refills | Status: DC | PRN

## 2020-01-13 MED ORDER — IOHEXOL 300 MG/ML  SOLN
100.0000 mL | Freq: Once | INTRAMUSCULAR | Status: AC | PRN
  Administered 2020-01-13: 100 mL via INTRAVENOUS

## 2020-01-13 MED ORDER — ONDANSETRON HCL 4 MG/2ML IJ SOLN
4.0000 mg | Freq: Once | INTRAMUSCULAR | Status: AC
  Administered 2020-01-13: 4 mg via INTRAVENOUS
  Filled 2020-01-13: qty 2

## 2020-01-13 NOTE — ED Notes (Signed)
Pt to CT

## 2020-01-13 NOTE — Discharge Instructions (Addendum)
Your CT scanned today shows that you likely have colitis.  Loperamide may help with the symptoms.  Also, the CT scan today shows that you have a blood clot in your right leg.  It is important that you take the blood thinner medication as directed.  You will need to follow-up with your primary care provider this week for recheck.   Information on my medicine - XARELTO (rivaroxaban)  This medication education was reviewed with me or my healthcare representative as part of my discharge preparation.  The pharmacist that spoke with me during my hospital stay was:  Ramond Craver, [place]? Xarelto was prescribed to treat blood clots that may have been found in the veins of your legs (deep vein thrombosis) or in your lungs (pulmonary embolism) and to reduce the risk of them occurring again.  What do you need to know about Xarelto? The starting dose is one 15 mg tablet taken TWICE daily with food for the FIRST 21 DAYS then the dose is changed to one 20 mg tablet taken ONCE A DAY with your evening meal.  DO NOT stop taking Xarelto without talking to the health care provider who prescribed the medication.  Refill your prescription for 20 mg tablets before you run out.  After discharge, you should have regular check-up appointments with your healthcare provider that is prescribing your Xarelto.  In the future your dose may need to be changed if your kidney function changes by a significant amount.  What do you do if you miss a dose? If you are taking Xarelto TWICE DAILY and you miss a dose, take it as soon as you remember. You may take two 15 mg tablets (total 30 mg) at the same time then resume your regularly scheduled 15 mg twice daily the next day.  If you are taking Xarelto ONCE DAILY and you miss a dose, take it as soon as you remember on the same day then continue your regularly scheduled once daily regimen the next day. Do not take two doses of Xarelto at the  same time.   Important Safety Information Xarelto is a blood thinner medicine that can cause bleeding. You should call your healthcare provider right away if you experience any of the following: ? Bleeding from an injury or your nose that does not stop. ? Unusual colored urine (red or dark brown) or unusual colored stools (red or black). ? Unusual bruising for unknown reasons. ? A serious fall or if you hit your head (even if there is no bleeding).  Some medicines may interact with Xarelto and might increase your risk of bleeding while on Xarelto. To help avoid this, consult your healthcare provider or pharmacist prior to using any new prescription or non-prescription medications, including herbals, vitamins, non-steroidal anti-inflammatory drugs (NSAIDs) and supplements.  This website has more information on Xarelto: [email or web address].[email or web address]

## 2020-01-13 NOTE — ED Notes (Signed)
Pt PO challenge drinking water and gingerale.

## 2020-01-13 NOTE — ED Triage Notes (Signed)
Pt from [facility] via [employer] with C/O N/V and diarrhea x 3 days.

## 2020-01-13 NOTE — ED Notes (Signed)
Pt tolerated PO challenge

## 2020-01-13 NOTE — ED Notes (Signed)
Report given to [place] at [facility]

## 2020-01-13 NOTE — ED Provider Notes (Signed)
[facility] Provider Note   CSN: [id number] Arrival date & time: 01/13/20  7564     History No chief complaint on file.   Tracie Wiggins is a 75 y.o. female.  HPI      Tracie Wiggins is a 75 y.o. female who comes from [facility] with past medical history of hydrocephalus with VP shunt placement , hypertension, and possible ileus from prior plain film imaging, who presents to the Emergency Department complaining of abdominal pain, diarrhea, nausea and vomiting. Symptoms worsening x2 weeks. Symptoms have been associated with some burning with urination as well. She describes having multiple episodes of vomiting and watery brown stools. She denies melena or hematochezia. Pain of her left abdomen. She denies fever, chills, chest pain, cough or shortness of breath.     Past Medical History:  Diagnosis Date  . Allergy   . Anxiety   . Arthritis   . Cataract   . Degenerative disease of nervous system ([place])   . Depression   . GERD (gastroesophageal reflux disease)   . Headache   . Hydrocephalus ([place])   . Hyperlipidemia   . Hypertension   . Osteoporosis   . Overactive bladder   . Shingles    twice    Patient Active Problem List   Diagnosis Date Noted  . 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 Meningioma ([place]) 08/18/2019  . Brain tumor ([place]) 08/17/2019  . Fall 04/06/2019  . Morbid obesity ([place]) 10/04/2014  . OAB (overactive bladder) 04/07/2014  . Hypertension 12/22/2012  . Hyperlipidemia 06/13/2012  . GERD (gastroesophageal reflux disease) 06/13/2012  . Osteoporosis 06/13/2012  . Depression 06/13/2012    Past Surgical History:  Procedure Laterality Date  . ABDOMINAL HYSTERECTOMY    . APPENDECTOMY    . BIOPSY BREAST Right    Fluid filled cyst  . CATARACT EXTRACTION W/PHACO Left 07/20/2016   Procedure: CATARACT EXTRACTION PHACO AND INTRAOCULAR LENS PLACEMENT (IOC);  Surgeon: Baruch Goldmann, MD;  Location: [facility];  Service:  Ophthalmology;  Laterality: Left;  CDE: 3.19  . CATARACT EXTRACTION W/PHACO Right 09/07/2016   Procedure: CATARACT EXTRACTION PHACO AND INTRAOCULAR LENS PLACEMENT (IOC);  Surgeon: Baruch Goldmann, MD;  Location: [facility];  Service: Ophthalmology;  Laterality: Right;  CDE: 5.04  . CHOLECYSTECTOMY    . CRANIOTOMY Right 08/19/2019   Procedure: RIGHT CRANIOTOMY TUMOR EXCISION;  Surgeon: Earnie Larsson, MD;  Location: [place];  Service: Neurosurgery;  Laterality: Right;  . NASAL SINUS SURGERY    . VENTRICULOPERITONEAL SHUNT Left 10/09/2019   Procedure: SHUNT REPLACEMENT LEFT;  Surgeon: Earnie Larsson, MD;  Location: [place];  Service: Neurosurgery;  Laterality: Left;     OB History   No obstetric history on file.     Family History  Problem Relation Age of Onset  . COPD Mother   . Stroke Mother   . Heart disease Father   . Heart attack Father   . Heart disease Sister   . Diabetes Sister   . Heart attack Sister   . Stroke Sister   . Healthy Daughter     Social History   Tobacco Use  . Smoking status: Never Smoker  . Smokeless tobacco: Never Used  Vaping Use  . Vaping Use: Never used  Substance Use Topics  . Alcohol use: No  . Drug use: No    Home Medications Prior to Admission medications   Medication Sig Start Date End Date Taking? Authorizing Provider  darifenacin (ENABLEX) 7.5 MG  24 hr tablet Take 7.5 mg by mouth daily.    [provider]  HYDROcodone-acetaminophen (NORCO/VICODIN) 5-325 MG tablet Take 1 tablet by mouth every 6 (six) hours as needed for moderate pain. 08/25/19   Viona Gilmore D, NP  levETIRAcetam (KEPPRA) 500 MG tablet Take 1 tablet (500 mg total) by mouth 2 (two) times daily. 08/25/19   Viona Gilmore D, NP  omeprazole (PRILOSEC) 40 MG capsule Take 1 capsule (40 mg total) by mouth daily. 04/06/19   Hassell Done, Mary-Margaret, FNP  polyethylene glycol (MIRALAX / GLYCOLAX) 17 g packet Take 17 g by mouth 2 (two) times daily. 08/25/19   Viona Gilmore D, NP  sertraline  (ZOLOFT) 25 MG tablet Take 0.5 tablets (12.5 mg total) by mouth daily as needed (for anxiety). Only takes 0.5 tablet 04/06/19   Chevis Pretty, FNP    Allergies    Asa [aspirin], Codeine, Erythromycin, Penicillins, and Niacin and related  Review of Systems   Review of Systems  Constitutional: Positive for appetite change (decreased appetite). Negative for chills and fever.  Respiratory: Negative for shortness of breath.   Cardiovascular: Negative for chest pain.  Gastrointestinal: Positive for abdominal pain, diarrhea, nausea and vomiting. Negative for blood in stool.  Genitourinary: Positive for dysuria. Negative for decreased urine volume, difficulty urinating and flank pain.  Musculoskeletal: Negative for myalgias.  Skin: Negative for color change and rash.  Neurological: Negative for dizziness, syncope, weakness, numbness and headaches.  Hematological: Negative for adenopathy.    Physical Exam Updated Vital Signs BP (!) 113/59 (BP Location: Right Arm)   Pulse 78   Temp 97.9 F (36.6 C) (Oral)   Resp (!) 23   SpO2 96%   Physical Exam Vitals and nursing note reviewed.  Constitutional:      General: She is not in acute distress.    Appearance: Normal appearance. She is not toxic-appearing.  HENT:     Head: Atraumatic.     Mouth/Throat:     Pharynx: Oropharynx is clear.     Comments: Mucous membranes are very dry Eyes:     Conjunctiva/sclera: Conjunctivae normal.     Pupils: Pupils are equal, round, and reactive to light.  Cardiovascular:     Rate and Rhythm: Normal rate and regular rhythm.     Pulses: Normal pulses.  Pulmonary:     Effort: Pulmonary effort is normal. No respiratory distress.     Breath sounds: Normal breath sounds.  Chest:     Chest wall: No tenderness.  Abdominal:     General: There is no distension.     Palpations: Abdomen is soft.     Tenderness: There is abdominal tenderness. There is no right CVA tenderness or left CVA tenderness.      Comments: Mild tenderness palpation of the left lower abdomen. Abdomen is soft, no guarding or rebound tenderness.  Musculoskeletal:        General: Normal range of motion.     Cervical back: Normal range of motion.     Right lower leg: No edema.     Left lower leg: No edema.  Skin:    General: Skin is warm.     Capillary Refill: Capillary refill takes less than 2 seconds.  Neurological:     General: No focal deficit present.     Mental Status: She is alert.     GCS: GCS eye subscore is 4. GCS verbal subscore is 5. GCS motor subscore is 6.     Sensory: Sensation is intact.  No sensory deficit.     Motor: Motor function is intact. No weakness.     Comments: CN III-XII grossly intact.  Speech clear.  No facial droop Answers questions appropriately.  mentating well     ED Results / Procedures / Treatments   Labs (all labs ordered are listed, but only abnormal results are displayed) Labs Reviewed - No data to display  EKG None  Radiology CT Abdomen Pelvis W Contrast  Result Date: 01/13/2020 CLINICAL DATA:  Nausea, vomiting and diarrhea for 3 days. EXAM: CT ABDOMEN AND PELVIS WITH CONTRAST TECHNIQUE: Multidetector CT imaging of the abdomen and pelvis was performed using the standard protocol following bolus administration of intravenous contrast. CONTRAST:  143mL OMNIPAQUE IOHEXOL 300 MG/ML  SOLN COMPARISON:  None. FINDINGS: Lower chest: The lung bases demonstrate minimal dependent subpleural atelectasis. No infiltrates or effusions. The heart is within normal limits in size for age. No pericardial effusion. Hepatobiliary: No hepatic lesions or intrahepatic biliary dilatation. The gallbladder is surgically absent. No common bile duct dilatation. Pancreas: No mass, inflammation or ductal dilatation. Spleen: Numerous calcified granulomas but no mass or splenomegaly. Adrenals/Urinary Tract: The adrenal glands are normal. No renal lesions or hydronephrosis.  The bladder is unremarkable.  Stomach/Bowel: The stomach, duodenum and small bowel are unremarkable. No acute inflammatory process, mass lesions or obstructive findings. The terminal ileum is unremarkable. The appendix is not identified. Fairly significant mucosal enhancement and submucosal edema involving the ascending colon suggesting an inflammatory or infectious colitis. No involvement of the transverse or descending colon is identified. Sigmoid colon is moderately dilated form less. Lower sigmoid colon and mild rectal mucosal enhancement without significant wall thickening. Moderate fluid in the sigmoid colon and rectum consistent with diarrhea. Vascular/Lymphatic: The aorta and branch vessels are patent. Scattered atherosclerotic calcifications. No aneurysm or dissection. The branch vessels are patent. The major venous structures are patent. No mesenteric or retroperitoneal mass or adenopathy. Prominent gonadal veins are noted on the left side. Some of these drain directly into the IVC. There is evidence of deep venous thrombosis in the right common femoral vein extending into the distal external iliac vein. Reproductive: Surgically absent. Other: No pelvic mass or adenopathy. No free pelvic fluid collections. No inguinal mass or adenopathy. No abdominal wall hernia or subcutaneous lesions. Musculoskeletal: No significant bony findings. IMPRESSION: 1. CT findings consistent with an inflammatory or infectious colitis involving the ascending colon and sigmoid colon/rectum. 2. Moderate fluid in the sigmoid colon and rectum consistent with diarrhea. 3. An incidental finding of potential clinical significance has been found. Deep venous thrombosis in the right common femoral vein and distal external iliac vein Electronically Signed   By: Marijo Sanes M.D.   On: 01/13/2020 13:59    Procedures Procedures (including critical care time)  Medications Ordered in ED Medications  ondansetron (ZOFRAN) injection 4 mg (4 mg Intravenous Given  01/13/20 1104)  sodium chloride 0.9 % bolus 1,000 mL (0 mLs Intravenous Stopped 01/13/20 1243)  iohexol (OMNIPAQUE) 300 MG/ML solution 100 mL (100 mLs Intravenous Contrast Given 01/13/20 1315)    ED Course  I have reviewed the triage vital signs and the nursing notes.  Pertinent labs & imaging results that were available during my care of the patient were reviewed by me and considered in my medical decision making (see chart for details).    MDM Rules/Calculators/A&P  Pt here from SNF for evaluation of abdominal pain, diarrhea and intermittent vomiting.  Symptoms reported x 2 weeks.  Diarrhea described as watery and brown in color.  Reviewed MAR, no recent antibiotics recorded.  mucous membranes moist.  She has hx of hydrocephalous with VP shunt, no complaint of headache   On recheck, pt reports feeling better after IVF's and antiemetic.  CT abdomen pelvis shows colitis, felt to be inflammatory.  No obstruction.  She does have mild leukocytosis, but is afebrile, reassuring vitals.  No active vomiting during ED stay.  Electrolytes reassuring.  No indication for sepsis. No indication of acute abdomen.   No hx of recent abx to suggest C Diff.   incidental finding of DVT of the right common femoral vein seen on CT abd/pelvis  1550 pt has tolerated oral fluid challenge and reports feeling better.  I feel she is appropriate for d/c back to SNF.  Will have her start loperamide and prescription for Xarelto with close out patient f/u  Final Clinical Impression(s) / ED Diagnoses Final diagnoses:  Colitis  Acute deep vein thrombosis (DVT) of femoral vein of right lower extremity [facility])    Rx / DC Orders ED Discharge Orders    None       Bufford Lope 01/13/20 1823    Luna Fuse, MD 01/20/20 [phone]

## 2020-01-14 DIAGNOSIS — D649 Anemia, unspecified: Secondary | ICD-10-CM | POA: Diagnosis not present

## 2020-01-14 DIAGNOSIS — F339 Major depressive disorder, recurrent, unspecified: Secondary | ICD-10-CM | POA: Diagnosis not present

## 2020-01-14 DIAGNOSIS — N189 Chronic kidney disease, unspecified: Secondary | ICD-10-CM | POA: Diagnosis not present

## 2020-01-14 DIAGNOSIS — E039 Hypothyroidism, unspecified: Secondary | ICD-10-CM | POA: Diagnosis not present

## 2020-01-14 DIAGNOSIS — R1915 Other abnormal bowel sounds: Secondary | ICD-10-CM | POA: Diagnosis not present

## 2020-01-14 DIAGNOSIS — F0390 Unspecified dementia without behavioral disturbance: Secondary | ICD-10-CM | POA: Diagnosis not present

## 2020-01-14 DIAGNOSIS — K567 Ileus, unspecified: Secondary | ICD-10-CM | POA: Diagnosis not present

## 2020-01-16 DIAGNOSIS — Z79899 Other long term (current) drug therapy: Secondary | ICD-10-CM | POA: Diagnosis not present

## 2020-01-16 LAB — URINE CULTURE: Culture: >=100000 — AB

## 2020-01-19 DIAGNOSIS — R1110 Vomiting, unspecified: Secondary | ICD-10-CM | POA: Diagnosis not present

## 2020-01-19 DIAGNOSIS — R109 Unspecified abdominal pain: Secondary | ICD-10-CM | POA: Diagnosis not present

## 2020-01-19 DIAGNOSIS — R140 Abdominal distension (gaseous): Secondary | ICD-10-CM | POA: Diagnosis not present

## 2020-01-20 DIAGNOSIS — K5900 Constipation, unspecified: Secondary | ICD-10-CM | POA: Diagnosis not present

## 2020-01-20 DIAGNOSIS — I82401 Acute embolism and thrombosis of unspecified deep veins of right lower extremity: Secondary | ICD-10-CM | POA: Diagnosis not present

## 2020-01-20 DIAGNOSIS — G919 Hydrocephalus, unspecified: Secondary | ICD-10-CM | POA: Diagnosis not present

## 2020-02-02 DIAGNOSIS — R630 Anorexia: Secondary | ICD-10-CM | POA: Diagnosis not present

## 2020-02-02 DIAGNOSIS — R112 Nausea with vomiting, unspecified: Secondary | ICD-10-CM | POA: Diagnosis not present

## 2020-02-02 DIAGNOSIS — K529 Noninfective gastroenteritis and colitis, unspecified: Secondary | ICD-10-CM | POA: Diagnosis not present

## 2020-02-02 DIAGNOSIS — R140 Abdominal distension (gaseous): Secondary | ICD-10-CM | POA: Diagnosis not present

## 2020-02-02 DIAGNOSIS — R109 Unspecified abdominal pain: Secondary | ICD-10-CM | POA: Diagnosis not present

## 2020-02-03 DIAGNOSIS — K567 Ileus, unspecified: Secondary | ICD-10-CM | POA: Diagnosis not present

## 2020-02-03 DIAGNOSIS — R531 Weakness: Secondary | ICD-10-CM | POA: Diagnosis not present

## 2020-02-16 DIAGNOSIS — R112 Nausea with vomiting, unspecified: Secondary | ICD-10-CM | POA: Diagnosis not present

## 2020-02-16 DIAGNOSIS — R531 Weakness: Secondary | ICD-10-CM | POA: Diagnosis not present

## 2020-02-16 DIAGNOSIS — R630 Anorexia: Secondary | ICD-10-CM | POA: Diagnosis not present

## 2020-02-17 DIAGNOSIS — E860 Dehydration: Secondary | ICD-10-CM | POA: Diagnosis not present

## 2020-02-17 DIAGNOSIS — R197 Diarrhea, unspecified: Secondary | ICD-10-CM | POA: Diagnosis not present

## 2020-02-17 DIAGNOSIS — R531 Weakness: Secondary | ICD-10-CM | POA: Diagnosis not present

## 2020-03-01 ENCOUNTER — Emergency Department (HOSPITAL_COMMUNITY): Payer: Medicare HMO

## 2020-03-01 ENCOUNTER — Other Ambulatory Visit: Payer: Self-pay

## 2020-03-01 ENCOUNTER — Inpatient Hospital Stay (HOSPITAL_COMMUNITY)
Admission: EM | Admit: 2020-03-01 | Discharge: 2020-03-02 | DRG: 951 | Disposition: A | Discharge status: Hospice | Payer: Medicare HMO | Attending: Internal Medicine | Admitting: Internal Medicine

## 2020-03-01 ENCOUNTER — Encounter (HOSPITAL_COMMUNITY): Payer: Self-pay

## 2020-03-01 DIAGNOSIS — D329 Benign neoplasm of meninges, unspecified: Secondary | ICD-10-CM | POA: Diagnosis present

## 2020-03-01 DIAGNOSIS — E8809 Other disorders of plasma-protein metabolism, not elsewhere classified: Secondary | ICD-10-CM | POA: Diagnosis present

## 2020-03-01 DIAGNOSIS — F0390 Unspecified dementia without behavioral disturbance: Secondary | ICD-10-CM | POA: Diagnosis present

## 2020-03-01 DIAGNOSIS — I10 Essential (primary) hypertension: Secondary | ICD-10-CM | POA: Diagnosis present

## 2020-03-01 DIAGNOSIS — D72829 Elevated white blood cell count, unspecified: Secondary | ICD-10-CM | POA: Diagnosis present

## 2020-03-01 DIAGNOSIS — R112 Nausea with vomiting, unspecified: Secondary | ICD-10-CM | POA: Insufficient documentation

## 2020-03-01 DIAGNOSIS — E669 Obesity, unspecified: Secondary | ICD-10-CM | POA: Diagnosis present

## 2020-03-01 DIAGNOSIS — R531 Weakness: Secondary | ICD-10-CM | POA: Diagnosis not present

## 2020-03-01 DIAGNOSIS — F32A Depression, unspecified: Secondary | ICD-10-CM | POA: Diagnosis present

## 2020-03-01 DIAGNOSIS — Z515 Encounter for palliative care: Secondary | ICD-10-CM | POA: Diagnosis not present

## 2020-03-01 DIAGNOSIS — G910 Communicating hydrocephalus: Secondary | ICD-10-CM | POA: Diagnosis not present

## 2020-03-01 DIAGNOSIS — Z66 Do not resuscitate: Secondary | ICD-10-CM | POA: Diagnosis present

## 2020-03-01 DIAGNOSIS — R000 Tachycardia, unspecified: Secondary | ICD-10-CM | POA: Diagnosis not present

## 2020-03-01 DIAGNOSIS — R601 Generalized edema: Secondary | ICD-10-CM | POA: Diagnosis present

## 2020-03-01 DIAGNOSIS — Z6835 Body mass index (BMI) 35.0-35.9, adult: Secondary | ICD-10-CM

## 2020-03-01 DIAGNOSIS — R627 Adult failure to thrive: Secondary | ICD-10-CM | POA: Diagnosis not present

## 2020-03-01 DIAGNOSIS — Z982 Presence of cerebrospinal fluid drainage device: Secondary | ICD-10-CM

## 2020-03-01 DIAGNOSIS — Z885 Allergy status to narcotic agent status: Secondary | ICD-10-CM

## 2020-03-01 DIAGNOSIS — Z8249 Family history of ischemic heart disease and other diseases of the circulatory system: Secondary | ICD-10-CM

## 2020-03-01 DIAGNOSIS — Z86011 Personal history of benign neoplasm of the brain: Secondary | ICD-10-CM

## 2020-03-01 DIAGNOSIS — E878 Other disorders of electrolyte and fluid balance, not elsewhere classified: Secondary | ICD-10-CM | POA: Diagnosis present

## 2020-03-01 DIAGNOSIS — K567 Ileus, unspecified: Secondary | ICD-10-CM | POA: Diagnosis present

## 2020-03-01 DIAGNOSIS — Z881 Allergy status to other antibiotic agents status: Secondary | ICD-10-CM

## 2020-03-01 DIAGNOSIS — R1110 Vomiting, unspecified: Secondary | ICD-10-CM | POA: Diagnosis not present

## 2020-03-01 DIAGNOSIS — E876 Hypokalemia: Secondary | ICD-10-CM | POA: Diagnosis present

## 2020-03-01 DIAGNOSIS — Z79899 Other long term (current) drug therapy: Secondary | ICD-10-CM

## 2020-03-01 DIAGNOSIS — Z20822 Contact with and (suspected) exposure to covid-19: Secondary | ICD-10-CM | POA: Diagnosis not present

## 2020-03-01 DIAGNOSIS — R933 Abnormal findings on diagnostic imaging of other parts of digestive tract: Secondary | ICD-10-CM | POA: Insufficient documentation

## 2020-03-01 DIAGNOSIS — Z9049 Acquired absence of other specified parts of digestive tract: Secondary | ICD-10-CM

## 2020-03-01 DIAGNOSIS — F419 Anxiety disorder, unspecified: Secondary | ICD-10-CM | POA: Diagnosis present

## 2020-03-01 DIAGNOSIS — Z86718 Personal history of other venous thrombosis and embolism: Secondary | ICD-10-CM

## 2020-03-01 DIAGNOSIS — Z823 Family history of stroke: Secondary | ICD-10-CM

## 2020-03-01 DIAGNOSIS — Z7189 Other specified counseling: Secondary | ICD-10-CM | POA: Diagnosis not present

## 2020-03-01 DIAGNOSIS — R404 Transient alteration of awareness: Secondary | ICD-10-CM | POA: Diagnosis not present

## 2020-03-01 DIAGNOSIS — R197 Diarrhea, unspecified: Secondary | ICD-10-CM | POA: Diagnosis not present

## 2020-03-01 DIAGNOSIS — E785 Hyperlipidemia, unspecified: Secondary | ICD-10-CM | POA: Diagnosis not present

## 2020-03-01 DIAGNOSIS — Z888 Allergy status to other drugs, medicaments and biological substances status: Secondary | ICD-10-CM

## 2020-03-01 DIAGNOSIS — R0902 Hypoxemia: Secondary | ICD-10-CM | POA: Diagnosis not present

## 2020-03-01 DIAGNOSIS — R109 Unspecified abdominal pain: Secondary | ICD-10-CM | POA: Diagnosis not present

## 2020-03-01 DIAGNOSIS — K219 Gastro-esophageal reflux disease without esophagitis: Secondary | ICD-10-CM | POA: Diagnosis present

## 2020-03-01 DIAGNOSIS — Z7401 Bed confinement status: Secondary | ICD-10-CM | POA: Diagnosis not present

## 2020-03-01 DIAGNOSIS — Z7901 Long term (current) use of anticoagulants: Secondary | ICD-10-CM

## 2020-03-01 DIAGNOSIS — Z825 Family history of asthma and other chronic lower respiratory diseases: Secondary | ICD-10-CM

## 2020-03-01 DIAGNOSIS — Z90710 Acquired absence of both cervix and uterus: Secondary | ICD-10-CM

## 2020-03-01 DIAGNOSIS — Z833 Family history of diabetes mellitus: Secondary | ICD-10-CM

## 2020-03-01 DIAGNOSIS — Z880 Allergy status to penicillin: Secondary | ICD-10-CM

## 2020-03-01 DIAGNOSIS — R630 Anorexia: Secondary | ICD-10-CM | POA: Diagnosis not present

## 2020-03-01 DIAGNOSIS — Z886 Allergy status to analgesic agent status: Secondary | ICD-10-CM

## 2020-03-01 LAB — CBC WITH DIFFERENTIAL/PLATELET
Abs Immature Granulocytes: 0.06 10*3/uL (ref 0.00–0.07)
Basophils Absolute: 0.1 10*3/uL (ref 0.0–0.1)
Basophils Relative: 0 %
Eosinophils Absolute: 0.1 10*3/uL (ref 0.0–0.5)
Eosinophils Relative: 1 %
HCT: 43 % (ref 36.0–46.0)
Hemoglobin: 14.3 g/dL (ref 12.0–15.0)
Immature Granulocytes: 1 %
Lymphocytes Relative: 34 %
Lymphs Abs: 4.3 10*3/uL — ABNORMAL HIGH (ref 0.7–4.0)
MCH: 31.6 pg (ref 26.0–34.0)
MCHC: 33.3 g/dL (ref 30.0–36.0)
MCV: 95.1 fL (ref 80.0–100.0)
Monocytes Absolute: 0.8 10*3/uL (ref 0.1–1.0)
Monocytes Relative: 7 %
Neutro Abs: 7.4 10*3/uL (ref 1.7–7.7)
Neutrophils Relative %: 57 %
Platelets: 372 10*3/uL (ref 150–400)
RBC: 4.52 MIL/uL (ref 3.87–5.11)
RDW: 18.5 % — ABNORMAL HIGH (ref 11.5–15.5)
WBC: 12.7 10*3/uL — ABNORMAL HIGH (ref 4.0–10.5)
nRBC: 0 % (ref 0.0–0.2)

## 2020-03-01 LAB — HEPATIC FUNCTION PANEL
ALT: 24 U/L (ref 0–44)
AST: 23 U/L (ref 15–41)
Albumin: 1.9 g/dL — ABNORMAL LOW (ref 3.5–5.0)
Alkaline Phosphatase: 99 U/L (ref 38–126)
Bilirubin, Direct: 0.3 mg/dL — ABNORMAL HIGH (ref 0.0–0.2)
Indirect Bilirubin: 0.5 mg/dL (ref 0.3–0.9)
Total Bilirubin: 0.8 mg/dL (ref 0.3–1.2)
Total Protein: 4.5 g/dL — ABNORMAL LOW (ref 6.5–8.1)

## 2020-03-01 LAB — RESP PANEL BY RT-PCR (FLU A&B, COVID) ARPGX2
Influenza A by PCR: NEGATIVE
Influenza B by PCR: NEGATIVE
SARS Coronavirus 2 by RT PCR: NEGATIVE

## 2020-03-01 LAB — MAGNESIUM: Magnesium: 1.4 mg/dL — ABNORMAL LOW (ref 1.7–2.4)

## 2020-03-01 LAB — BASIC METABOLIC PANEL
Anion gap: 12 (ref 5–15)
BUN: 23 mg/dL (ref 8–23)
CO2: 19 mmol/L — ABNORMAL LOW (ref 22–32)
Calcium: 7.3 mg/dL — ABNORMAL LOW (ref 8.9–10.3)
Chloride: 109 mmol/L (ref 98–111)
Creatinine, Ser: 0.68 mg/dL (ref 0.44–1.00)
GFR, Estimated: >60 mL/min (ref >60)
Glucose, Bld: 73 mg/dL (ref 70–99)
Potassium: 2.7 mmol/L — CL (ref 3.5–5.1)
Sodium: 140 mmol/L (ref 135–145)

## 2020-03-01 LAB — TSH: TSH: 2.271 u[IU]/mL (ref 0.350–4.500)

## 2020-03-01 LAB — LIPASE, BLOOD: Lipase: 18 U/L (ref 11–51)

## 2020-03-01 MED ORDER — SODIUM CHLORIDE 0.9 % IV BOLUS
500.0000 mL | Freq: Once | INTRAVENOUS | Status: AC
  Administered 2020-03-01: 500 mL via INTRAVENOUS

## 2020-03-01 MED ORDER — POTASSIUM CHLORIDE 10 MEQ/100ML IV SOLN
10.0000 meq | Freq: Once | INTRAVENOUS | Status: AC
  Administered 2020-03-01: 10 meq via INTRAVENOUS
  Filled 2020-03-01: qty 100

## 2020-03-01 MED ORDER — SODIUM CHLORIDE 0.9% FLUSH: 3.0000 mL | Freq: Two times a day (BID) | INTRAVENOUS | Status: DC

## 2020-03-01 MED ORDER — POTASSIUM CHLORIDE IN NACL 40-0.9 MEQ/L-% IV SOLN: INTRAVENOUS | Status: DC

## 2020-03-01 MED ORDER — LEVETIRACETAM 500 MG PO TABS: 500.0000 mg | ORAL_TABLET | Freq: Two times a day (BID) | ORAL | Status: DC

## 2020-03-01 MED ORDER — MAGNESIUM SULFATE 2 GM/50ML IV SOLN
2.0000 g | Freq: Once | INTRAVENOUS | Status: AC
  Administered 2020-03-01: 2 g via INTRAVENOUS
  Filled 2020-03-01: qty 50

## 2020-03-01 MED ORDER — MIRTAZAPINE 15 MG PO TABS: 7.5000 mg | ORAL_TABLET | Freq: Every day | ORAL | Status: DC

## 2020-03-01 MED ORDER — HYDRALAZINE HCL 20 MG/ML IJ SOLN: 5.0000 mg | INTRAMUSCULAR | Status: DC | PRN

## 2020-03-01 MED ORDER — HALOPERIDOL 0.5 MG PO TABS
0.5000 mg | ORAL_TABLET | ORAL | Status: DC | PRN
Start: 2020-03-01 — End: 2020-03-02

## 2020-03-01 MED ORDER — FAMOTIDINE IN NACL 20-0.9 MG/50ML-% IV SOLN
20.0000 mg | Freq: Once | INTRAVENOUS | Status: AC
  Administered 2020-03-01: 20 mg via INTRAVENOUS
  Filled 2020-03-01: qty 50

## 2020-03-01 MED ORDER — HALOPERIDOL LACTATE 2 MG/ML PO CONC
0.5000 mg | ORAL | Status: DC | PRN
  Filled 2020-03-01: qty 0.3

## 2020-03-01 MED ORDER — MORPHINE BOLUS VIA INFUSION
2.0000 mg | INTRAVENOUS | Status: DC | PRN
Start: 2020-03-01 — End: 2020-03-02
  Filled 2020-03-01: qty 2

## 2020-03-01 MED ORDER — IOHEXOL 300 MG/ML  SOLN
100.0000 mL | Freq: Once | INTRAMUSCULAR | Status: AC | PRN
  Administered 2020-03-01: 100 mL via INTRAVENOUS

## 2020-03-01 MED ORDER — ONDANSETRON HCL 4 MG PO TABS: 4.0000 mg | ORAL_TABLET | Freq: Four times a day (QID) | ORAL | Status: DC | PRN

## 2020-03-01 MED ORDER — PANTOPRAZOLE SODIUM 40 MG IV SOLR: 40.0000 mg | Freq: Two times a day (BID) | INTRAVENOUS | Status: DC

## 2020-03-01 MED ORDER — MORPHINE 100MG IN NS 100ML (1MG/ML) PREMIX INFUSION
1.0000 mg/h | INTRAVENOUS | Status: DC
  Administered 2020-03-01: 5 mg/h via INTRAVENOUS
  Filled 2020-03-01: qty 100

## 2020-03-01 MED ORDER — POLYVINYL ALCOHOL 1.4 % OP SOLN: 1.0000 [drp] | Freq: Four times a day (QID) | OPHTHALMIC | Status: DC | PRN

## 2020-03-01 MED ORDER — APIXABAN 5 MG PO TABS: 5.0000 mg | ORAL_TABLET | Freq: Two times a day (BID) | ORAL | Status: DC

## 2020-03-01 MED ORDER — LORAZEPAM 1 MG PO TABS: 1.0000 mg | ORAL_TABLET | ORAL | Status: DC | PRN

## 2020-03-01 MED ORDER — PANTOPRAZOLE SODIUM 40 MG IV SOLR
40.0000 mg | Freq: Once | INTRAVENOUS | Status: AC
  Administered 2020-03-01: 40 mg via INTRAVENOUS
  Filled 2020-03-01: qty 40

## 2020-03-01 MED ORDER — ACETAMINOPHEN 650 MG RE SUPP: 650.0000 mg | Freq: Four times a day (QID) | RECTAL | Status: DC | PRN

## 2020-03-01 MED ORDER — ACETAMINOPHEN 325 MG PO TABS: 650.0000 mg | ORAL_TABLET | Freq: Four times a day (QID) | ORAL | Status: DC | PRN

## 2020-03-01 MED ORDER — GLYCOPYRROLATE 0.2 MG/ML IJ SOLN: 0.2000 mg | INTRAMUSCULAR | Status: DC | PRN

## 2020-03-01 MED ORDER — GLYCOPYRROLATE 1 MG PO TABS: 1.0000 mg | ORAL_TABLET | ORAL | Status: DC | PRN

## 2020-03-01 MED ORDER — LORAZEPAM 2 MG/ML PO CONC: 1.0000 mg | ORAL | Status: DC | PRN

## 2020-03-01 MED ORDER — ONDANSETRON HCL 4 MG/2ML IJ SOLN: 4.0000 mg | Freq: Four times a day (QID) | INTRAMUSCULAR | Status: DC | PRN

## 2020-03-01 MED ORDER — BIOTENE DRY MOUTH MT LIQD: 15.0000 mL | OROMUCOSAL | Status: DC | PRN

## 2020-03-01 MED ORDER — LORAZEPAM 2 MG/ML IJ SOLN: 1.0000 mg | INTRAMUSCULAR | Status: DC | PRN

## 2020-03-01 MED ORDER — GLYCOPYRROLATE 0.2 MG/ML IJ SOLN
0.2000 mg | INTRAMUSCULAR | Status: DC | PRN
Start: 2020-03-01 — End: 2020-03-02

## 2020-03-01 MED ORDER — HALOPERIDOL LACTATE 5 MG/ML IJ SOLN: 0.5000 mg | INTRAMUSCULAR | Status: DC | PRN

## 2020-03-01 NOTE — ED Triage Notes (Signed)
Pt brought to ED via [facility] for failure to thrive. Pt is from [facility], staff states has not been taking medications, eating or drinking for last couple of weeks. Pt has been declining for last couple of weeks. Been diagnosed with brain tumor previously and declining since. Family wanted second opinion.

## 2020-03-01 NOTE — H&P (Addendum)
History and Physical    Tracie Wiggins:[id number] DOB: 03/10/1945 DOA: 03/01/2020  PCP: Chevis Pretty, FNP Consultants:  [facility] - neurosurgery;  Patient coming from: [facility]; NOK:  Daughter, [name], [phone]  Chief Complaint: FTT  HPI: Tracie Wiggins is a 75 y.o. female with medical history significant of HTN; HLD; meningioma s/p resection with hydrocephalus, shunt; and depression/anxiety presenting with FTT.  The patient is alert and oriented to person only.  Per chart, she has not been eating or drinking or taking medications at her facility.    Her daughter reports that she had brain tumor removed and did well until she developed hydrocephalus.  Since then, doesn't eat/drink, doesn't walk, doesn't seem to care.  She was expected to recover initially in a month or two and now thought to be a couple of years.  Has dementia now, which was unexpected.  She has had colitis intermittently for 1-2 years.  She has had seizures, last was prior to removal of the meningioma.  Goals would be to return to normal function, but it appears that she won't be able to return from it.  Now can't stand without 2 person assistance.   She does not want anymore operations, feeding tubes.  Family thinks she is at a place where she just wants to be comfortable.  She feels terrible all the time and is miserable, she is in pain and hurting.  They just don't think it is going to get better.    ED Course:  Decreased PO intake x 2 weeks, n/v/d, abdominal pain.  Has had chronic issues since August.  CT with ileus, rectal impaction? Loose brown stool on exam so maybe functional obstruction.  Started on Protonix/Pepcid.  Also with edema, anasarca of abdominal wall but unremarkable evaluation.  Has hypo K+ and hypo Mag++.  Previously DNR/DNI, still desires.  Will call GI.   Review of Systems: Unable to perform  Ambulatory Status:  Non-ambulatory   Past Medical History:  Diagnosis Date  .  Allergy   . Anxiety   . Arthritis   . Cataract   . Degenerative disease of nervous system ([place])   . Depression   . GERD (gastroesophageal reflux disease)   . Headache   . Hydrocephalus ([place])   . Hyperlipidemia   . Hypertension   . Osteoporosis   . Overactive bladder   . Shingles    twice    Past Surgical History:  Procedure Laterality Date  . ABDOMINAL HYSTERECTOMY    . APPENDECTOMY    . BIOPSY BREAST Right    Fluid filled cyst  . CATARACT EXTRACTION W/PHACO Left 07/20/2016   Procedure: CATARACT EXTRACTION PHACO AND INTRAOCULAR LENS PLACEMENT (IOC);  Surgeon: Baruch Goldmann, MD;  Location: [facility];  Service: Ophthalmology;  Laterality: Left;  CDE: 3.19  . CATARACT EXTRACTION W/PHACO Right 09/07/2016   Procedure: CATARACT EXTRACTION PHACO AND INTRAOCULAR LENS PLACEMENT (IOC);  Surgeon: Baruch Goldmann, MD;  Location: [facility];  Service: Ophthalmology;  Laterality: Right;  CDE: 5.04  . CHOLECYSTECTOMY    . CRANIOTOMY Right 08/19/2019   Procedure: RIGHT CRANIOTOMY TUMOR EXCISION;  Surgeon: Earnie Larsson, MD;  Location: [place];  Service: Neurosurgery;  Laterality: Right;  . NASAL SINUS SURGERY    . VENTRICULOPERITONEAL SHUNT Left 10/09/2019   Procedure: SHUNT REPLACEMENT LEFT;  Surgeon: Earnie Larsson, MD;  Location: [place];  Service: Neurosurgery;  Laterality: Left;    Social History   Socioeconomic History  . Marital status: Widowed  Spouse name: Not on file  . Number of children: 1  . Years of education: 63  . Highest education level: 9th grade  Occupational History  . Occupation: Retired    Fish farm manager: [employer]  Tobacco Use  . Smoking status: Never Smoker  . Smokeless tobacco: Never Used  Vaping Use  . Vaping Use: Never used  Substance and Sexual Activity  . Alcohol use: No  . Drug use: No  . Sexual activity: Not Currently  Other Topics Concern  . Not on file  Social History Narrative  . Not on file   Social Determinants of Health   Financial Resource Strain: Not on file   Food Insecurity: Not on file  Transportation Needs: Not on file  Physical Activity: Not on file  Stress: Not on file  Social Connections: Not on file  Intimate Partner Violence: Not on file    Allergies  Allergen Reactions  . Asa [Aspirin] Anaphylaxis, Nausea Only and Rash  . Codeine Anaphylaxis and Rash  . Erythromycin Anaphylaxis  . Penicillins Anaphylaxis and Hives    Has patient had a PCN reaction causing immediate rash, facial/tongue/throat swelling, SOB or lightheadedness with hypotension: Yes Has patient had a PCN reaction causing severe rash involving mucus membranes or skin necrosis: No Has patient had a PCN reaction that required hospitalization: No Has patient had a PCN reaction occurring within the last 10 years: No If all of the above answers are "NO", then may proceed with Cephalosporin use.   . Niacin And Related Rash    Family History  Problem Relation Age of Onset  . COPD Mother   . Stroke Mother   . Heart disease Father   . Heart attack Father   . Heart disease Sister   . Diabetes Sister   . Heart attack Sister   . Stroke Sister   . Healthy Daughter     Prior to Admission medications   Medication Sig Start Date End Date Taking? Authorizing Provider  acetaminophen (TYLENOL) 325 MG tablet Take 650 mg by mouth in the morning, at noon, and at bedtime.   Yes [provider]  calcium-vitamin D (OSCAL WITH D) 500-200 MG-UNIT tablet Take 1 tablet by mouth 2 (two) times daily.   Yes [provider]  CRANBERRY PO Take 300 mg by mouth in the morning and at bedtime.   Yes [provider]  ELIQUIS 5 MG TABS tablet Take 5 mg by mouth 2 (two) times daily. 01/25/20  Yes [provider]  LACTOBACILLUS PO Take 1 capsule by mouth in the morning and at bedtime.   Yes [provider]  LACTULOSE PO Take 30 mLs by mouth every 12 (twelve) hours as needed (constipation).   Yes [provider]  levETIRAcetam (KEPPRA) 500 MG  tablet Take 1 tablet (500 mg total) by mouth 2 (two) times daily. 08/25/19  Yes Viona Gilmore D, NP  LINZESS 145 MCG CAPS capsule Take 145 mcg by mouth daily. 01/25/20  Yes [provider]  mirtazapine (REMERON) 7.5 MG tablet Take 7.5 mg by mouth at bedtime.   Yes [provider]  omeprazole (PRILOSEC) 40 MG capsule Take 1 capsule (40 mg total) by mouth daily. 04/06/19  Yes Martin, Mary-Margaret, FNP  ondansetron (ZOFRAN-ODT) 4 MG disintegrating tablet Take 4 mg by mouth every 6 (six) hours as needed for nausea or vomiting.   Yes [provider]  potassium chloride SA (KLOR-CON) 20 MEQ tablet Take 20 mEq by mouth daily.   Yes  [provider]  senna (SENOKOT) 8.6 MG tablet Take 2 tablets by mouth 2 (two) times daily.   Yes [provider]  sertraline (ZOLOFT) 25 MG tablet Take 0.5 tablets (12.5 mg total) by mouth daily as needed (for anxiety). Only takes 0.5 tablet Patient taking differently: Take 12.5 mg by mouth daily as needed (depression). 04/06/19  Yes Hassell Done, Mary-Margaret, FNP  HYDROcodone-acetaminophen (NORCO/VICODIN) 5-325 MG tablet Take 1 tablet by mouth every 6 (six) hours as needed for moderate pain. Patient not taking: Reported on 03/01/2020 08/25/19   Viona Gilmore D, NP  loperamide (IMODIUM) 2 MG capsule Take 1 capsule (2 mg total) by mouth 4 (four) times daily as needed for diarrhea or loose stools. Patient not taking: Reported on 03/01/2020 01/13/20   Triplett, Tammy, PA-C  polyethylene glycol (MIRALAX / GLYCOLAX) 17 g packet Take 17 g by mouth 2 (two) times daily. Patient not taking: Reported on 03/01/2020 08/25/19   Patricia Nettle, NP  RIVAROXABAN Alveda Reasons) VTE STARTER PACK (15 & 20 MG) Follow package directions: Take one 15mg  tablet by mouth twice a day. On day 22, switch to one 20mg  tablet once a day. Take with food. Patient not taking: Reported on 03/01/2020 01/13/20   Kem Parkinson, PA-C    Physical Exam: Vitals:   03/01/20 1600 03/01/20  1603 03/01/20 1615 03/01/20 1645  BP: 115/71   115/87  Pulse: 99  98 (!) 104  Resp: 16  14 20   Temp:  (!) 97.5 F (36.4 C)  (!) 97.5 F (36.4 C)  TempSrc:  Oral  Oral  SpO2: 99%  100% 100%  Weight:      Height:         . General:  Appears chronically ill . Eyes:  PERRL, EOMI, normal lids, iris . ENT:  Dry lips & tongue, dry mm . Neck:  no LAD, masses or thyromegaly . Cardiovascular:  RRR, no m/r/g. No LE edema.  [name] Respiratory:   CTA bilaterally with no wheezes/rales/rhonchi.  Normal respiratory effort. . Abdomen:  soft, NT, ND . Skin:  no rash or induration seen on limited exam; diffuse blackheads on nose . Musculoskeletal:  decreased tone BUE/BLE, no bony abnormality . Psychiatric:  flat mood and affect, speech sparse, AOx1 . Neurologic:  CN 2-12 grossly intact but difficult to assess given overall condition    Radiological Exams on Admission: Independently reviewed - see discussion in A/P where applicable  CT ABDOMEN PELVIS W CONTRAST  Result Date: 03/01/2020 CLINICAL DATA:  Nausea, vomiting and abdominal pain. Failure to thrive. EXAM: CT ABDOMEN AND PELVIS WITH CONTRAST TECHNIQUE: Multidetector CT imaging of the abdomen and pelvis was performed using the standard protocol following bolus administration of intravenous contrast. CONTRAST:  175mL OMNIPAQUE IOHEXOL 300 MG/ML  SOLN COMPARISON:  Prior CT of the abdomen and pelvis with contrast on 01/13/2020 FINDINGS: Lower chest: Trace bilateral pleural effusions. Hepatobiliary: No focal liver abnormality is seen. Status post cholecystectomy. No biliary dilatation. Pancreas: Unremarkable. No pancreatic ductal dilatation or surrounding inflammatory changes. Spleen: Stable calcified splenic granulomata. Adrenals/Urinary Tract: Adrenal glands are unremarkable. Kidneys are normal, without renal calculi, focal lesion, or hydronephrosis. Bladder is unremarkable. Stomach/Bowel: There is some further distension of the sigmoid colon by gas and  liquefied stool since the prior study. At the level of the rectum, mucosal enhancement is again noted without discrete lesion. Functional or mechanical rectal obstruction is not excluded by imaging and correlation is suggested with rectal exam. Rectal decompression may be of benefit. Gaseous distension  does continue into other segments of the colon consistent with ileus. The ascending colon again demonstrates some mucosal prominence without distension and suggestion of some submucosal fat which may relate to chronic inflammation. There is no surrounding stranding in the pericolic fat to suggest significant acute colitis. No free air identified. No evidence of small bowel obstruction. Mild nonspecific thickened appearance of the distal stomach and proximal duodenum without evidence of overt visible ulceration or perforation. Findings may relate to underlying peptic ulcer disease or inflammation. Vascular/Lymphatic: No significant vascular findings are present. Previously noted filling defect at the level of the right common femoral vein extending into the external iliac vein appears to have resolved consistent with resolved DVT. No enlarged abdominal or pelvic lymph nodes. Reproductive: Status post hysterectomy. Stable cyst of the left adnexal region. Other: Ventriculoperitoneal shunt tubing again is noted entering the peritoneal cavity in the midline upper abdomen with some redundancy in the right anterior abdomen followed by eventual course across the midline to the left paracolic region. No evidence of disruption of shunt tubing or abnormal fluid collections along the course of the peritoneal portions of the catheter. No ascites or evidence of focal abscess. Increased body wall edema and anasarca since the prior study which is especially prominent in the lateral right abdominal wall. Component of cellulitis cannot be excluded. No evidence of subcutaneous soft tissue air or focal soft tissue abscess.  Musculoskeletal: No acute or significant osseous findings. IMPRESSION: 1. Increased body wall edema and anasarca since the prior study which is especially prominent in the lateral right abdominal wall. Component of cellulitis cannot be excluded. No evidence of subcutaneous soft tissue air or focal soft tissue abscess. 2. Mild nonspecific thickened appearance of the distal stomach and proximal duodenum without evidence of overt ulceration or perforation. Findings may relate to underlying peptic ulcer disease or inflammation. 3. There is some further distension of the sigmoid colon by gas and liquefied stool since the prior study. At the level of the rectum, mucosal enhancement is again noted without discrete lesion. Functional or mechanical rectal obstruction is not excluded by imaging and correlation is suggested with rectal exam. Gaseous distension does continue into other segments of the colon consistent with ileus. Rectal decompression may be of benefit. 4. Stable appearance of mucosal prominence and suggestion of some submucosal fat in the ascending colon which may relate to chronic inflammation. There is no evidence of significant acute colitis. 5. Resolution of previously noted DVT in the right common femoral vein extending into the external iliac vein. 6. Trace bilateral pleural effusions. Electronically Signed   By: Aletta Edouard M.D.   On: 03/01/2020 13:44    EKG: Independently reviewed.  Sinus tachycardia with rate 101; nonspecific ST changes with no evidence of acute ischemia   Labs on Admission: I have personally reviewed the available labs and imaging studies at the time of the admission.  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[facility])   Failure to thrive in adult   Ileus ([facility])   Electrolyte abnormality   -Patient with prior meningioma  resection; did well after that but developed communicating hydrocephalus with shunt placement and has been deteriorating since -She has been suffering from FTT for months now  -She has expressly asked to be able to die and her family is ready to let her go -She presented  today with recurrent ileus; the recommended treatment from GI is a rectal tube and tap water enemas -We were also in the process of repleting electrolytes - likely associated with poor PO intake -After discussion with family, the patient and her family would not want to continue with an aggressive treatment plan and would like to proceed with comfort care only -Palliative care consulted -Patient is likely to be a candidate for [employer] or other residential hospice, as she does not appear to be actively dying at this time -However, her reserve is likely quite low overall frailty -Comfort care order set utilized -Pain control with morphine drip   DVT prophylaxis: None - comfort measures Code Status: DNR - confirmed with family Family Communication: None present; I spoke with her daughter (and her sister in the background in full agreement) at the time of the admission Disposition Plan: Anticipate in-hospital death Consults called: GI Admission status: Admit - It is my clinical opinion that admission to INPATIENT is reasonable and necessary because of the expectation that this patient will require hospital care that crosses at least 2 midnights to treat this condition based on the medical complexity of the problems presented.  Given the aforementioned information, the predictability of an adverse outcome is felt to be significant.    Karmen Bongo MD [facility]   How to contact the [facility] Attending or Consulting provider [place] or covering provider during after hours [place], for this patient?  1. Check the care team in [facility] and look for a) attending/consulting [facility] provider listed and b) the [facility] team listed 2. Log  into [email or web address] and use [place]'s universal password to access. If you do not have the password, please contact the hospital operator. 3. Locate the [facility] provider you are looking for under [facility] and page to a number that you can be directly reached. 4. If you still have difficulty reaching the provider, please page the [facility] (Director on Call) for the Hospitalists listed on amion for assistance.   03/01/2020, 9:10 PM

## 2020-03-01 NOTE — ED Notes (Signed)
Date and time results received: 03/01/20 1238 (use smartphrase ".now" to insert current time)  Test: K Critical Value: 2.7  Name of Provider Notified: Gloris Ham, PA  Orders Received? Or Actions Taken?: na

## 2020-03-01 NOTE — ED Provider Notes (Addendum)
[facility] EMERGENCY DEPARTMENT Provider Note   CSN: [id number] Arrival date & time: 03/01/20  1116     History Chief Complaint  Patient presents with  . Failure To Thrive    Tracie Wiggins is a 75 y.o. female history includes GERD, hydrocephalus status post shunt placement, brain tumor status post resection, hypertension, hyperlipidemia, obesity.  Patient arrives from skilled nursing facility for evaluation of failure to thrive.  Patient alert and oriented x2 which she reports that she has been having vomiting for around 2 weeks and abdominal pain which she describes as a cramping generalized abdominal pain moderate intensity constant worsened with eating and with vomiting no alleviating factors.  She denies any fever or any additional concerns.  Level 5 caveat dementia.  HPI     Past Medical History:  Diagnosis Date  . Allergy   . Anxiety   . Arthritis   . Cataract   . Degenerative disease of nervous system ([place])   . Depression   . GERD (gastroesophageal reflux disease)   . Headache   . Hydrocephalus ([place])   . Hyperlipidemia   . Hypertension   . Osteoporosis   . Overactive bladder   . Shingles    twice    Patient Active Problem List   Diagnosis Date Noted  . Failure to thrive in adult 03/01/2020  . 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 Meningioma ([place]) 08/18/2019  . Brain tumor ([place]) 08/17/2019  . Fall 04/06/2019  . Morbid obesity ([place]) 10/04/2014  . OAB (overactive bladder) 04/07/2014  . Hypertension 12/22/2012  . Hyperlipidemia 06/13/2012  . GERD (gastroesophageal reflux disease) 06/13/2012  . Osteoporosis 06/13/2012  . Depression 06/13/2012    Past Surgical History:  Procedure Laterality Date  . ABDOMINAL HYSTERECTOMY    . APPENDECTOMY    . BIOPSY BREAST Right    Fluid filled cyst  . CATARACT EXTRACTION W/PHACO Left 07/20/2016   Procedure: CATARACT EXTRACTION PHACO AND INTRAOCULAR LENS PLACEMENT (IOC);  Surgeon: Baruch Goldmann, MD;   Location: [facility];  Service: Ophthalmology;  Laterality: Left;  CDE: 3.19  . CATARACT EXTRACTION W/PHACO Right 09/07/2016   Procedure: CATARACT EXTRACTION PHACO AND INTRAOCULAR LENS PLACEMENT (IOC);  Surgeon: Baruch Goldmann, MD;  Location: [facility];  Service: Ophthalmology;  Laterality: Right;  CDE: 5.04  . CHOLECYSTECTOMY    . CRANIOTOMY Right 08/19/2019   Procedure: RIGHT CRANIOTOMY TUMOR EXCISION;  Surgeon: Earnie Larsson, MD;  Location: [place];  Service: Neurosurgery;  Laterality: Right;  . NASAL SINUS SURGERY    . VENTRICULOPERITONEAL SHUNT Left 10/09/2019   Procedure: SHUNT REPLACEMENT LEFT;  Surgeon: Earnie Larsson, MD;  Location: [place];  Service: Neurosurgery;  Laterality: Left;     OB History   No obstetric history on file.     Family History  Problem Relation Age of Onset  . COPD Mother   . Stroke Mother   . Heart disease Father   . Heart attack Father   . Heart disease Sister   . Diabetes Sister   . Heart attack Sister   . Stroke Sister   . Healthy Daughter     Social History   Tobacco Use  . Smoking status: Never Smoker  . Smokeless tobacco: Never Used  Vaping Use  . Vaping Use: Never used  Substance Use Topics  . Alcohol use: No  . Drug use: No    Home Medications Prior to Admission medications   Medication Sig Start Date End Date Taking? Authorizing Provider  acetaminophen (TYLENOL) 325  MG tablet Take 650 mg by mouth in the morning, at noon, and at bedtime.   Yes [provider]  calcium-vitamin D (OSCAL WITH D) 500-200 MG-UNIT tablet Take 1 tablet by mouth 2 (two) times daily.   Yes [provider]  CRANBERRY PO Take 300 mg by mouth in the morning and at bedtime.   Yes [provider]  ELIQUIS 5 MG TABS tablet Take 5 mg by mouth 2 (two) times daily. 01/25/20  Yes [provider]  LACTOBACILLUS PO Take 1 capsule by mouth in the morning and at bedtime.   Yes [provider]  LACTULOSE PO Take 30 mLs by mouth every 12 (twelve)  hours as needed (constipation).   Yes [provider]  levETIRAcetam (KEPPRA) 500 MG tablet Take 1 tablet (500 mg total) by mouth 2 (two) times daily. 08/25/19  Yes Viona Gilmore D, NP  LINZESS 145 MCG CAPS capsule Take 145 mcg by mouth daily. 01/25/20  Yes [provider]  mirtazapine (REMERON) 7.5 MG tablet Take 7.5 mg by mouth at bedtime.   Yes [provider]  omeprazole (PRILOSEC) 40 MG capsule Take 1 capsule (40 mg total) by mouth daily. 04/06/19  Yes Martin, Mary-Margaret, FNP  ondansetron (ZOFRAN-ODT) 4 MG disintegrating tablet Take 4 mg by mouth every 6 (six) hours as needed for nausea or vomiting.   Yes [provider]  potassium chloride SA (KLOR-CON) 20 MEQ tablet Take 20 mEq by mouth daily.   Yes [provider]  senna (SENOKOT) 8.6 MG tablet Take 2 tablets by mouth 2 (two) times daily.   Yes [provider]  sertraline (ZOLOFT) 25 MG tablet Take 0.5 tablets (12.5 mg total) by mouth daily as needed (for anxiety). Only takes 0.5 tablet Patient taking differently: Take 12.5 mg by mouth daily as needed (depression). 04/06/19  Yes Hassell Done, Mary-Margaret, FNP  HYDROcodone-acetaminophen (NORCO/VICODIN) 5-325 MG tablet Take 1 tablet by mouth every 6 (six) hours as needed for moderate pain. Patient not taking: Reported on 03/01/2020 08/25/19   Viona Gilmore D, NP  loperamide (IMODIUM) 2 MG capsule Take 1 capsule (2 mg total) by mouth 4 (four) times daily as needed for diarrhea or loose stools. Patient not taking: Reported on 03/01/2020 01/13/20   Triplett, Tammy, PA-C  polyethylene glycol (MIRALAX / GLYCOLAX) 17 g packet Take 17 g by mouth 2 (two) times daily. Patient not taking: Reported on 03/01/2020 08/25/19   Patricia Nettle, NP  RIVAROXABAN Alveda Reasons) VTE STARTER PACK (15 & 20 MG) Follow package directions: Take one 15mg  tablet by mouth twice a day. On day 22, switch to one 20mg  tablet once a day. Take with food. Patient not taking: Reported on  03/01/2020 01/13/20   Kem Parkinson, PA-C    Allergies    Asa [aspirin], Codeine, Erythromycin, Penicillins, and Niacin and related  Review of Systems   Review of Systems  Unable to perform ROS: Dementia    Physical Exam Updated Vital Signs BP 99/60   Pulse (!) 104   Temp 97.6 F (36.4 C) (Oral)   Resp 16   Ht 5\' 3"  (1.6 m)   Wt 90.8 kg   SpO2 100%   BMI 35.46 kg/m   Physical Exam Constitutional:      General: She is not in acute distress.    Appearance: Normal appearance. She is well-developed. She is not ill-appearing or diaphoretic.  HENT:     Head: Normocephalic and atraumatic.  Eyes:     General: Vision  grossly intact. Gaze aligned appropriately.     Pupils: Pupils are equal, round, and reactive to light.  Neck:     Trachea: Trachea and phonation normal.  Cardiovascular:     Rate and Rhythm: Tachycardia present. Rhythm irregular.  Pulmonary:     Effort: Pulmonary effort is normal. No respiratory distress.     Breath sounds: Normal breath sounds.  Abdominal:     General: There is no distension.     Palpations: Abdomen is soft.     Tenderness: There is generalized abdominal tenderness. There is no guarding or rebound.  Genitourinary:    Comments: Rectal exam chaperoned by [facility] nurse tech.  Normal external exam, no hemorrhoids fissures or evidence of infection.  Normal rectal tone.  No palpable internal hemorrhoids, stool balls or obstruction.  Light brown liquid stool present. Musculoskeletal:        General: Normal range of motion.     Cervical back: Normal range of motion.  Skin:    General: Skin is warm and dry.  Neurological:     Mental Status: She is alert.     GCS: GCS eye subscore is 4. GCS verbal subscore is 5. GCS motor subscore is 6.     Comments: Speech is clear and goal oriented, follows commands Major Cranial nerves without deficit, no facial droop Generally weak bilaterally but with equal strength  Psychiatric:        Behavior: Behavior  normal.     ED Results / Procedures / Treatments   Labs (all labs ordered are listed, but only abnormal results are displayed) Labs Reviewed  CBC WITH DIFFERENTIAL/PLATELET - Abnormal; Notable for the following components:      Result Value   WBC 12.7 (*)    RDW 18.5 (*)    Lymphs Abs 4.3 (*)    All other components within normal limits  BASIC METABOLIC PANEL - Abnormal; Notable for the following components:   Potassium 2.7 (*)    CO2 19 (*)    Calcium 7.3 (*)    All other components within normal limits  HEPATIC FUNCTION PANEL - Abnormal; Notable for the following components:   Total Protein 4.5 (*)    Albumin 1.9 (*)    Bilirubin, Direct 0.3 (*)    All other components within normal limits  MAGNESIUM - Abnormal; Notable for the following components:   Magnesium 1.4 (*)    All other components within normal limits  URINE CULTURE  CULTURE, BLOOD (ROUTINE X 2)  CULTURE, BLOOD (ROUTINE X 2)  RESP PANEL BY RT-PCR (FLU A&B, COVID) ARPGX2  LIPASE, BLOOD  URINALYSIS, ROUTINE W REFLEX MICROSCOPIC  LACTIC ACID, PLASMA  LACTIC ACID, PLASMA    EKG None  Radiology CT ABDOMEN PELVIS W CONTRAST  Result Date: 03/01/2020 CLINICAL DATA:  Nausea, vomiting and abdominal pain. Failure to thrive. EXAM: CT ABDOMEN AND PELVIS WITH CONTRAST TECHNIQUE: Multidetector CT imaging of the abdomen and pelvis was performed using the standard protocol following bolus administration of intravenous contrast. CONTRAST:  166mL OMNIPAQUE IOHEXOL 300 MG/ML  SOLN COMPARISON:  Prior CT of the abdomen and pelvis with contrast on 01/13/2020 FINDINGS: Lower chest: Trace bilateral pleural effusions. Hepatobiliary: No focal liver abnormality is seen. Status post cholecystectomy. No biliary dilatation. Pancreas: Unremarkable. No pancreatic ductal dilatation or surrounding inflammatory changes. Spleen: Stable calcified splenic granulomata. Adrenals/Urinary Tract: Adrenal glands are unremarkable. Kidneys are normal,  without renal calculi, focal lesion, or hydronephrosis. Bladder is unremarkable. Stomach/Bowel: There is some further distension of the  sigmoid colon by gas and liquefied stool since the prior study. At the level of the rectum, mucosal enhancement is again noted without discrete lesion. Functional or mechanical rectal obstruction is not excluded by imaging and correlation is suggested with rectal exam. Rectal decompression may be of benefit. Gaseous distension does continue into other segments of the colon consistent with ileus. The ascending colon again demonstrates some mucosal prominence without distension and suggestion of some submucosal fat which may relate to chronic inflammation. There is no surrounding stranding in the pericolic fat to suggest significant acute colitis. No free air identified. No evidence of small bowel obstruction. Mild nonspecific thickened appearance of the distal stomach and proximal duodenum without evidence of overt visible ulceration or perforation. Findings may relate to underlying peptic ulcer disease or inflammation. Vascular/Lymphatic: No significant vascular findings are present. Previously noted filling defect at the level of the right common femoral vein extending into the external iliac vein appears to have resolved consistent with resolved DVT. No enlarged abdominal or pelvic lymph nodes. Reproductive: Status post hysterectomy. Stable cyst of the left adnexal region. Other: Ventriculoperitoneal shunt tubing again is noted entering the peritoneal cavity in the midline upper abdomen with some redundancy in the right anterior abdomen followed by eventual course across the midline to the left paracolic region. No evidence of disruption of shunt tubing or abnormal fluid collections along the course of the peritoneal portions of the catheter. No ascites or evidence of focal abscess. Increased body wall edema and anasarca since the prior study which is especially prominent in the  lateral right abdominal wall. Component of cellulitis cannot be excluded. No evidence of subcutaneous soft tissue air or focal soft tissue abscess. Musculoskeletal: No acute or significant osseous findings. IMPRESSION: 1. Increased body wall edema and anasarca since the prior study which is especially prominent in the lateral right abdominal wall. Component of cellulitis cannot be excluded. No evidence of subcutaneous soft tissue air or focal soft tissue abscess. 2. Mild nonspecific thickened appearance of the distal stomach and proximal duodenum without evidence of overt ulceration or perforation. Findings may relate to underlying peptic ulcer disease or inflammation. 3. There is some further distension of the sigmoid colon by gas and liquefied stool since the prior study. At the level of the rectum, mucosal enhancement is again noted without discrete lesion. Functional or mechanical rectal obstruction is not excluded by imaging and correlation is suggested with rectal exam. Gaseous distension does continue into other segments of the colon consistent with ileus. Rectal decompression may be of benefit. 4. Stable appearance of mucosal prominence and suggestion of some submucosal fat in the ascending colon which may relate to chronic inflammation. There is no evidence of significant acute colitis. 5. Resolution of previously noted DVT in the right common femoral vein extending into the external iliac vein. 6. Trace bilateral pleural effusions. Electronically Signed   By: Aletta Edouard M.D.   On: 03/01/2020 13:44    Procedures Procedures   Medications Ordered in ED Medications  potassium chloride 10 mEq in 100 mL IVPB (10 mEq Intravenous New Bag/Given 03/01/20 1521)  potassium chloride 10 mEq in 100 mL IVPB ( Intravenous Stopped 03/01/20 1408)  sodium chloride 0.9 % bolus 500 mL (0 mLs Intravenous Stopped 03/01/20 1420)  iohexol (OMNIPAQUE) 300 MG/ML solution 100 mL (100 mLs Intravenous Contrast Given 03/01/20  1254)  pantoprazole (PROTONIX) injection 40 mg (40 mg Intravenous Given 03/01/20 1419)  famotidine (PEPCID) IVPB 20 mg premix (0 mg Intravenous Stopped 03/01/20 1522)  sodium chloride 0.9 % bolus 500 mL (500 mLs Intravenous New Bag/Given 03/01/20 1522)    ED Course  I have reviewed the triage vital signs and the nursing notes.  Pertinent labs & imaging results that were available during my care of the patient were reviewed by me and considered in my medical decision making (see chart for details).  Clinical Course as of 03/01/20 1539  Tue Mar 01, 2020  1300 Bostic,Lilly (Daughter)  [phone] [facility]) [BM]  1304 [phone] Remo Lipps RN [BM]  1306 Linzess Lactulose Senna [BM]  34 Dr. Lorin Mercy [BM]    Clinical Course User Index [BM] Gari Crown   MDM Rules/Calculators/A&P                         Additional history obtained from: 1. Nursing notes from this visit. 2. Review of electronic medical records. 3. Family member, [place]. Anna. ------------------------- 75 year old female arrives today for evaluation abdominal pain nausea vomiting and decreased p.o. intake x2 weeks.  She has a history of dementia is alert and oriented x2 on exam mildly tender abdomen no other complaints.  I was able to speak with patient's daughter Ralph Leyden who last saw the patient around a week ago.  She reports patient has had decreasing mental status since shunt placement in August of last year.  Since arriving the nursing home she has had difficulty with constipation and diarrhea alternatingly and recently diagnosed with colitis.  She is not aware of any recent fevers vomiting or any additional concerns.  Reports patient's baseline mental status is alert to self and occasionally others.  I was then able to speak with patient's nurse Remo Lipps.  Remo Lipps RN reports that for several weeks the patient has had decreased p.o. intake and has stopped taking her pills over the last few  days.  She reports patient vomited occasionally and has been complaining of abdominal pain.  She reports they have been giving patient IV fluids and have taken x-rays of her belly which does not show any blockage.  She reports the patient currently takes Linzess, lactulose and senna for constipation and had her last bowel movement yesterday.  No fevers cough falls or any additional concerns - I ordered, reviewed and interpreted labs which include: CBC shows mild leukocytosis of 12.7, no anemia. BMP shows hypokalemia 2.7, no AKI or gap. Magnesium low 1.4 Lipase within normal limits, doubt pancreatitis. LFTs show no acute elevations.  CTAP:    IMPRESSION:  1. Increased body wall edema and anasarca since the prior study  which is especially prominent in the lateral right abdominal wall.  Component of cellulitis cannot be excluded. No evidence of  subcutaneous soft tissue air or focal soft tissue abscess.  2. Mild nonspecific thickened appearance of the distal stomach and  proximal duodenum without evidence of overt ulceration or  perforation. Findings may relate to underlying peptic ulcer disease  or inflammation.  3. There is some further distension of the sigmoid colon by gas and  liquefied stool since the prior study. At the level of the rectum,  mucosal enhancement is again noted without discrete lesion.  Functional or mechanical rectal obstruction is not excluded by  imaging and correlation is suggested with rectal exam. Gaseous  distension does continue into other segments of the colon consistent  with ileus. Rectal decompression may be of benefit.  4. Stable appearance of mucosal prominence and suggestion of some  submucosal fat in  the ascending colon which may relate to chronic  inflammation. There is no evidence of significant acute colitis.  5. Resolution of previously noted DVT in the right common femoral  vein extending into the external iliac vein.  6. Trace bilateral  pleural effusions.   There is no erythema or evidence of cellulitis on physical examination today, will defer further evaluation of body wall edema and anasarca to medicine team. Patient was started on Protonix and Pepcid for possible underlying peptic ulcer disease, she has no hematemesis on history and patient still reports a history of GERD but no history of ulcer disease. Rectal examination was performed with chaperone, no palpable obstruction light brown liquid stool present, question functional obstruction. Patient was given IV fluids, Protonix/Pepcid and potassium supplementation in the ER. Consult called to hospitalist Dr. Lorin Mercy at [facility] PM, patient was accepted for admission, I have placed GI consult as well. On reevaluation patient is resting comfortably in bed, sleeping no acute distress mild tachycardia improved with IV fluids. Lactic acid, urinalysis, Covid test and blood cultures pending.  Patient was reassessed, sleeping comfortably in bed no acute distress.  She is agreeable for admission denies any pain at this time.  Vital signs are stable.  Per family member mental status appears to be baseline.  Patient's daughter Ralph Leyden to visit later on today. - 4:10 PM: Consult with gastroenterologist Dr. Abbey Chatters who will make GI team aware and see patient during this admission.  Note: Portions of this report may have been transcribed using voice recognition software. Every effort was made to ensure accuracy; however, inadvertent computerized transcription errors may still be present. Final Clinical Impression(s) / ED Diagnoses Final diagnoses:  Hypokalemia  Nausea vomiting and diarrhea    Rx / DC Orders ED Discharge Orders    None       Deliah Boston, PA-C 03/01/20 1612    Gari Crown 03/01/20 1621    Milton Ferguson, MD 03/26/20 [phone]

## 2020-03-01 NOTE — ED Notes (Addendum)
Patient transported to CT 

## 2020-03-01 NOTE — ED Notes (Signed)
Pt returned to room  

## 2020-03-01 NOTE — Consult Note (Addendum)
@LOGO @   Referring Provider: Nuala Alpha, PA-C Primary Care Physician:  Chevis Pretty, FNP Primary Gastroenterologist:  Dr. Laural Golden  Date of Admission: 03/01/2020 Date of Consultation: 03/01/2020  Reason for Consultation: N/V/?D with dilated colon on CT and gastric and duodenal wall thickening.  HPI:  Tracie Wiggins is a 75 y.o. year old female with history of GERD, hydrocephalus s/p stent placement October 2021, brain tumor s/p resection in August 2021, HTN, HLD, GERD, obesity who presented to the emergency room today from skilled nursing facility for evaluation of failure to thrive in the setting of n/v and abdominal pain x2 weeks.   Per review of ED note, provider was able to speak with patient's nurse Tracie Cobb RN reports that for several weeks the patient has had decreased p.o. intake and has stopped taking her pills over the last few days.  She reports patient vomited occasionally and has been complaining of abdominal pain.  She reports they have been giving patient IV fluids and have taken x-rays of her belly which does not show any blockage.  She reports the patient currently takes Linzess, lactulose and senna for constipation and had her last bowel movement yesterday.    Ed Course:  Mild intermittent tachycardia, other vitals within normal limits.  Mild leukocytosis, 12.7.  Hemoglobin 14.3, platelets 372.  Sodium 140, potassium 2.7, magnesium low at 1.4, BUN 23, creatinine 0.68.  LFTs within normal limits, albumin low at 1.9.  Lipase normal.  CT A/P with contrast with increased body wall edema and anasarca especially prominent in the lateral right abdominal wall, mild nonspecific thickened appearance of distal stomach and proximal duodenum without overt ulceration or perforation, further distention of sigmoid colon by gas and liquefied stool since prior study, mucosal enhancement at the level of the rectum without discrete lesion.  States functional mechanical rectal  obstruction is not excluded by imaging.  Gaseous distention does continue into the segments of the colon consistent with ileus.  Rectal decompression may be of benefit.  Stable appearance of mucosal prominence and suggestion of some submucosal fat in the ascending colon which may relate to chronic inflammation. No evidence of significant acute colitis.  Resolution of previously noted DVT of right common femoral vein extending into the external iliac vein.  Reviewed prior CT A/P 01/13/2020. CT findings consistent with inflammatory or infectious colitis involving the ascending and sigmoid colon/rectum, moderate fluid in the sigmoid colon and rectum consistent with diarrhea, DVT in right common femoral vein and distal external iliac vein.  She received potassium, IV fluids, Protonix, Pepcid in the emergency room.  Today:  Difficult to get a clear history from patient today in the setting of dementia. Reports 2 to 3-week history of nausea, vomiting, diarrhea, and constant mild lower abdominal pain. Abdominal pain is worsened by meals. No alleviating factors. She reports 2-3 watery BMs daily. Intermittent GERD symptoms. ?dysphagia. Reports she gets choked at times. No brbpr or melena. No hematemesis.   No fever, chills, CP, or SOB.    Per nursing staff, no complaint of nausea, no vomiting, and no diarrhea in the emergency room thus far.  Past Medical History:  Diagnosis Date  . Allergy   . Anxiety   . Arthritis   . Cataract   . Degenerative disease of nervous system ([place])   . Depression   . GERD (gastroesophageal reflux disease)   . Headache   . Hydrocephalus ([place])   . Hyperlipidemia   . Hypertension   . Osteoporosis   .  Overactive bladder   . Shingles    twice    Past Surgical History:  Procedure Laterality Date  . ABDOMINAL HYSTERECTOMY    . APPENDECTOMY    . BIOPSY BREAST Right    Fluid filled cyst  . CATARACT EXTRACTION W/PHACO Left 07/20/2016   Procedure: CATARACT EXTRACTION  PHACO AND INTRAOCULAR LENS PLACEMENT (IOC);  Surgeon: Baruch Goldmann, MD;  Location: [facility];  Service: Ophthalmology;  Laterality: Left;  CDE: 3.19  . CATARACT EXTRACTION W/PHACO Right 09/07/2016   Procedure: CATARACT EXTRACTION PHACO AND INTRAOCULAR LENS PLACEMENT (IOC);  Surgeon: Baruch Goldmann, MD;  Location: [facility];  Service: Ophthalmology;  Laterality: Right;  CDE: 5.04  . CHOLECYSTECTOMY    . CRANIOTOMY Right 08/19/2019   Procedure: RIGHT CRANIOTOMY TUMOR EXCISION;  Surgeon: Earnie Larsson, MD;  Location: [place];  Service: Neurosurgery;  Laterality: Right;  . NASAL SINUS SURGERY    . 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 Authorizing Provider  acetaminophen (TYLENOL) 325 MG tablet Take 650 mg by mouth in the morning, at noon, and at bedtime.   Yes [provider]  calcium-vitamin D (OSCAL WITH D) 500-200 MG-UNIT tablet Take 1 tablet by mouth 2 (two) times daily.   Yes [provider]  CRANBERRY PO Take 300 mg by mouth in the morning and at bedtime.   Yes [provider]  ELIQUIS 5 MG TABS tablet Take 5 mg by mouth 2 (two) times daily. 01/25/20  Yes [provider]  LACTOBACILLUS PO Take 1 capsule by mouth in the morning and at bedtime.   Yes [provider]  LACTULOSE PO Take 30 mLs by mouth every 12 (twelve) hours as needed (constipation).   Yes [provider]  levETIRAcetam (KEPPRA) 500 MG tablet Take 1 tablet (500 mg total) by mouth 2 (two) times daily. 08/25/19  Yes Viona Gilmore D, NP  LINZESS 145 MCG CAPS capsule Take 145 mcg by mouth daily. 01/25/20  Yes [provider]  mirtazapine (REMERON) 7.5 MG tablet Take 7.5 mg by mouth at bedtime.   Yes [provider]  omeprazole (PRILOSEC) 40 MG capsule Take 1 capsule (40  mg total) by mouth daily. 04/06/19  Yes Martin, Mary-Margaret, FNP  ondansetron (ZOFRAN-ODT) 4 MG disintegrating tablet Take 4 mg by mouth every 6 (six) hours as needed for nausea or vomiting.   Yes [provider]  potassium chloride SA (KLOR-CON) 20 MEQ tablet Take 20 mEq by mouth daily.   Yes [provider]  senna (SENOKOT) 8.6 MG tablet Take 2 tablets by mouth 2 (two) times daily.   Yes [provider]  sertraline (ZOLOFT) 25 MG tablet Take 0.5 tablets (12.5 mg total) by mouth daily as needed (for anxiety). Only takes 0.5 tablet Patient taking differently: Take 12.5 mg by mouth daily as needed (depression). 04/06/19  Yes Hassell Done, Mary-Margaret, FNP  HYDROcodone-acetaminophen (NORCO/VICODIN) 5-325 MG tablet Take 1 tablet by mouth every 6 (six) hours as needed for moderate pain. Patient not taking: Reported on 03/01/2020 08/25/19   Viona Gilmore D, NP  loperamide (IMODIUM) 2 MG capsule Take 1 capsule (2 mg total) by mouth 4 (four) times daily as needed for diarrhea or loose stools. Patient not taking: Reported on 03/01/2020 01/13/20   Triplett, Tammy, PA-C  polyethylene glycol (MIRALAX / GLYCOLAX)  17 g packet Take 17 g by mouth 2 (two) times daily. Patient not taking: Reported on 03/01/2020 08/25/19   Patricia Nettle, NP  RIVAROXABAN [employer] Reasons) VTE STARTER PACK (15 & 20 MG) Follow package directions: Take one 15mg  tablet by mouth twice a day. On day 22, switch to one 20mg  tablet once a day. Take with food. Patient not taking: Reported on 03/01/2020 01/13/20   Kem Parkinson, PA-C    Current Facility-Administered Medications  Medication Dose Route Frequency Provider Last Rate Last Admin  . potassium chloride 10 mEq in 100 mL IVPB  10 mEq Intravenous Once Nuala Alpha A, PA-C 100 mL/hr at 03/01/20 1544 Infusion Verify at 03/01/20 1544   Current Outpatient Medications  Medication Sig Dispense Refill  . acetaminophen (TYLENOL) 325 MG tablet Take 650 mg by mouth in the morning,  at noon, and at bedtime.    . calcium-vitamin D (OSCAL WITH D) 500-200 MG-UNIT tablet Take 1 tablet by mouth 2 (two) times daily.    [name] CRANBERRY PO Take 300 mg by mouth in the morning and at bedtime.    [name] ELIQUIS 5 MG TABS tablet Take 5 mg by mouth 2 (two) times daily.    [name] LACTOBACILLUS PO Take 1 capsule by mouth in the morning and at bedtime.    [name] LACTULOSE PO Take 30 mLs by mouth every 12 (twelve) hours as needed (constipation).    [name] levETIRAcetam (KEPPRA) 500 MG tablet Take 1 tablet (500 mg total) by mouth 2 (two) times daily. 60 tablet   . LINZESS 145 MCG CAPS capsule Take 145 mcg by mouth daily.    . mirtazapine (REMERON) 7.5 MG tablet Take 7.5 mg by mouth at bedtime.    [name] omeprazole (PRILOSEC) 40 MG capsule Take 1 capsule (40 mg total) by mouth daily. 90 capsule 1  . ondansetron (ZOFRAN-ODT) 4 MG disintegrating tablet Take 4 mg by mouth every 6 (six) hours as needed for nausea or vomiting.    . potassium chloride SA (KLOR-CON) 20 MEQ tablet Take 20 mEq by mouth daily.    [name] senna (SENOKOT) 8.6 MG tablet Take 2 tablets by mouth 2 (two) times daily.    . sertraline (ZOLOFT) 25 MG tablet Take 0.5 tablets (12.5 mg total) by mouth daily as needed (for anxiety). Only takes 0.5 tablet (Patient taking differently: Take 12.5 mg by mouth daily as needed (depression).) 90 tablet 1  . HYDROcodone-acetaminophen (NORCO/VICODIN) 5-325 MG tablet Take 1 tablet by mouth every 6 (six) hours as needed for moderate pain. (Patient not taking: Reported on 03/01/2020) 28 tablet 0  . loperamide (IMODIUM) 2 MG capsule Take 1 capsule (2 mg total) by mouth 4 (four) times daily as needed for diarrhea or loose stools. (Patient not taking: Reported on 03/01/2020) 12 capsule 0  . polyethylene glycol (MIRALAX / GLYCOLAX) 17 g packet Take 17 g by mouth 2 (two) times daily. (Patient not taking: Reported on 03/01/2020) 14 each 0  . RIVAROXABAN (XARELTO) VTE STARTER PACK (15 & 20 MG) Follow package directions: Take one 15mg  tablet by  mouth twice a day. On day 22, switch to one 20mg  tablet once a day. Take with food. (Patient not taking: Reported on 03/01/2020) 51 each 0    Allergies as of 03/01/2020 - Review Complete 03/01/2020  Allergen Reaction Noted  . Asa [aspirin] Anaphylaxis, Nausea Only, and Rash 01/11/2012  . Codeine Anaphylaxis and Rash 01/11/2012  . Erythromycin Anaphylaxis 01/11/2012  . Penicillins Anaphylaxis and Hives 01/11/2012  . Niacin  and related Rash 07/13/2016    Family History  Problem Relation Age of Onset  . COPD Mother   . Stroke Mother   . Heart disease Father   . Heart attack Father   . Heart disease Sister   . Diabetes Sister   . Heart attack Sister   . Stroke Sister   . Healthy Daughter     Social History   Socioeconomic History  . Marital status: Widowed    Spouse name: Not on file  . Number of children: 1  . Years of education: 38  . Highest education level: 9th grade  Occupational History  . Occupation: Retired    Fish farm manager: [employer]  Tobacco Use  . Smoking status: Never Smoker  . Smokeless tobacco: Never Used  Vaping Use  . Vaping Use: Never used  Substance and Sexual Activity  . Alcohol use: No  . Drug use: No  . Sexual activity: Not Currently  Other Topics Concern  . Not on file  Social History Narrative  . Not on file   Social Determinants of Health   Financial Resource Strain: Not on file  Food Insecurity: Not on file  Transportation Needs: Not on file  Physical Activity: Not on file  Stress: Not on file  Social Connections: Not on file  Intimate Partner Violence: Not on file    Review of Systems:  Limited, possibly unreliable in the setting of dementia. Gen: Denies fever, chills, cold or flulike symptoms, presyncope, syncope. CV: Denies chest pain or palpitations.  Resp: Denies shortness  GI: See HPI Heme: See HPI  Physical Exam: Vital signs in last 24 hours: Temp:  [97.6 F (36.4 C)-97.8 F (36.6 C)] 97.6 F (36.4 C) (03/01 1224) Pulse Rate:   [94-108] 95 (03/01 1530) Resp:  [12-20] 20 (03/01 1530) BP: (99-131)/(7-88) 131/74 (03/01 1530) SpO2:  [97 %-100 %] 97 % (03/01 1530) Weight:  [90.8 kg] 90.8 kg (03/01 1120)   General:   Alert,  Well-developed, well-nourished, pleasant and cooperative in NAD Head:  Normocephalic and atraumatic. Eyes:  Sclera clear, no icterus.   Conjunctiva pink. Ears:  Normal auditory acuity. Lungs:  Clear throughout to auscultation.   No wheezes, crackles, or rhonchi. No acute distress. Heart:  Regular rate and rhythm; no murmurs, clicks, rubs,  or gallops. Abdomen:  Soft, and nondistended. Mild generalized tenderness to palpation.  No masses, hepatosplenomegaly or hernias noted. Normal bowel sounds, without guarding, and without rebound.   Rectal:  No significant external or internal lesions or masses, no palpable hemorrhoids or stool ball, rectal vault is dilated and cavernous. Upon removing gloved exam finger, patient passed a small amount of light brown liquid stool.  Msk:  Symmetrical without gross deformities. Extremities:  With 1+ bilateral LE pitting edema. Neurologic:  Alert and  oriented to person, location, and situation. Skin:  Intact without significant lesions or rashes. Psych:Normal mood and affect.  Intake/Output from previous day: No intake/output data recorded. Intake/Output this shift: Total I/O In: 1185.8 [IV Piggyback:1185.8] Out: -   Lab Results: Recent Labs    03/01/20 1130  WBC 12.7*  HGB 14.3  HCT 43.0  PLT 372   BMET Recent Labs    03/01/20 1130  NA 140  K 2.7*  CL 109  CO2 19*  GLUCOSE 73  BUN 23  CREATININE 0.68  CALCIUM 7.3*   LFT Recent Labs    03/01/20 1130  PROT 4.5*  ALBUMIN 1.9*  AST 23  ALT 24  ALKPHOS 99  BILITOT 0.8  BILIDIR 0.3*  IBILI 0.5    Studies/Results: CT ABDOMEN PELVIS W CONTRAST  Result Date: 03/01/2020 CLINICAL DATA:  Nausea, vomiting and abdominal pain. Failure to thrive. EXAM: CT ABDOMEN AND PELVIS WITH CONTRAST  TECHNIQUE: Multidetector CT imaging of the abdomen and pelvis was performed using the standard protocol following bolus administration of intravenous contrast. CONTRAST:  111mL OMNIPAQUE IOHEXOL 300 MG/ML  SOLN COMPARISON:  Prior CT of the abdomen and pelvis with contrast on 01/13/2020 FINDINGS: Lower chest: Trace bilateral pleural effusions. Hepatobiliary: No focal liver abnormality is seen. Status post cholecystectomy. No biliary dilatation. Pancreas: Unremarkable. No pancreatic ductal dilatation or surrounding inflammatory changes. Spleen: Stable calcified splenic granulomata. Adrenals/Urinary Tract: Adrenal glands are unremarkable. Kidneys are normal, without renal calculi, focal lesion, or hydronephrosis. Bladder is unremarkable. Stomach/Bowel: There is some further distension of the sigmoid colon by gas and liquefied stool since the prior study. At the level of the rectum, mucosal enhancement is again noted without discrete lesion. Functional or mechanical rectal obstruction is not excluded by imaging and correlation is suggested with rectal exam. Rectal decompression may be of benefit. Gaseous distension does continue into other segments of the colon consistent with ileus. The ascending colon again demonstrates some mucosal prominence without distension and suggestion of some submucosal fat which may relate to chronic inflammation. There is no surrounding stranding in the pericolic fat to suggest significant acute colitis. No free air identified. No evidence of small bowel obstruction. Mild nonspecific thickened appearance of the distal stomach and proximal duodenum without evidence of overt visible ulceration or perforation. Findings may relate to underlying peptic ulcer disease or inflammation. Vascular/Lymphatic: No significant vascular findings are present. Previously noted filling defect at the level of the right common femoral vein extending into the external iliac vein appears to have resolved  consistent with resolved DVT. No enlarged abdominal or pelvic lymph nodes. Reproductive: Status post hysterectomy. Stable cyst of the left adnexal region. Other: Ventriculoperitoneal shunt tubing again is noted entering the peritoneal cavity in the midline upper abdomen with some redundancy in the right anterior abdomen followed by eventual course across the midline to the left paracolic region. No evidence of disruption of shunt tubing or abnormal fluid collections along the course of the peritoneal portions of the catheter. No ascites or evidence of focal abscess. Increased body wall edema and anasarca since the prior study which is especially prominent in the lateral right abdominal wall. Component of cellulitis cannot be excluded. No evidence of subcutaneous soft tissue air or focal soft tissue abscess. Musculoskeletal: No acute or significant osseous findings. IMPRESSION: 1. Increased body wall edema and anasarca since the prior study which is especially prominent in the lateral right abdominal wall. Component of cellulitis cannot be excluded. No evidence of subcutaneous soft tissue air or focal soft tissue abscess. 2. Mild nonspecific thickened appearance of the distal stomach and proximal duodenum without evidence of overt ulceration or perforation. Findings may relate to underlying peptic ulcer disease or inflammation. 3. There is some further distension of the sigmoid colon by gas and liquefied stool since the prior study. At the level of the rectum, mucosal enhancement is again noted without discrete lesion. Functional or mechanical rectal obstruction is not excluded by imaging and correlation is suggested with rectal exam. Gaseous distension does continue into other segments of the colon consistent with ileus. Rectal decompression may be of benefit. 4. Stable appearance of mucosal prominence and suggestion of some submucosal fat in the ascending colon which may relate  to chronic inflammation. There is no  evidence of significant acute colitis. 5. Resolution of previously noted DVT in the right common femoral vein extending into the external iliac vein. 6. Trace bilateral pleural effusions. Electronically Signed   By: Aletta Edouard M.D.   On: 03/01/2020 13:44    Impression: 75 y.o. year old female with history of GERD, hydrocephalus s/p stent placement October 2021, brain tumor s/p resection in August 2021, HTN, HLD, GERD, obesity, who presented to the emergency room today from skilled nursing facility for evaluation of failure to thrive in the setting of n/v/?diarrhea and abdominal pain x2 weeks. Notably, she was evaluated in the emergency room in mid January for similar symptoms found to have findings consistent with inflammatory versus infectious colitis involving ascending and sigmoid colon/rectum. Findings suspected to be secondary to inflammatory colitis and she was discharged with Imodium. Also found to have DVT in right common femoral vein and distal external iliac vein and started on Xarelto.  This admission, CT A/P with further distention of sigmoid colon by gas and liquefied stool since prior study, mucosal enhancement at level of rectum without discrete lesion, gaseous distention continues into segments of colon consistent with ileus. Also with stable mucosal prominence in suggestion of submucosal fat in the ascending colon which may relate to chronic inflammation. Also with mild nonspecific thickened appearance of distal stomach and proximal duodenum without overt ulceration or perforation.  Labs remarkable for mild leukocytosis, 12.7, hypokalemia, 2.7, hypomagnesemia, 1.4, hypoalbuminemia, 1.9. LFTs, lipase, hemoglobin, Cr within normal limits. No diarrhea, vomiting, or complaint of nausea since presenting to the emergency room. Abdominal exam with mild generalized tenderness to palpation. Rectal exam with no significant external or internal lesions or masses, no palpable hemorrhoids or stool,  rectal vault is dilated and cavernous. Upon removing gloved exam finger, patient passed a small amount of light brown liquid stool.   Presentation is most consistent with ileus, may be functional in the setting of decreased mobility and electrolyte abnormalities. No fecal impaction appreciated, but can't rule out obstructing process higher than I was able to reach. Can't rule out underlying infectious/inflammatory colitis though no significant bowel wall thickening is noted. Passage of small volume liquid stool likely more of an overflow presentation. She will need colonoscopy at some point for further evaluation and may need intervention for decompression at some point, but will hold off for now and try to manage supportively. Will make her NPO, start tap water enemas to try to get stool evacuated from the colon, as well as rectal tube to help with decompression.  Correction of electrolytes per hospitalist.   Gastric and duodenal thickening: Non-specific. This may also be contributing to nausea/vomiting presentation. Can't rule out gastritis, duodenitis, or PUD. No NSAIDs listed on outpatient med list. Would benefit from EGD once over acute illness. Recommend PPI twice daily for now.   Plan: NPO. Rectal tube. Tapwater enema now and again in 1 hour. Start tapwater enema every 8 hours tomorrow. PPI twice daily. Would likely benefit from EGD and colonoscopy once over acute illness.  Reassess tomorrow a.m.   LOS: 0 days    03/01/2020, 3:45 PM   Aliene Altes, [facility]

## 2020-03-02 ENCOUNTER — Encounter (HOSPITAL_COMMUNITY): Payer: Self-pay | Admitting: Internal Medicine

## 2020-03-02 DIAGNOSIS — Z7189 Other specified counseling: Secondary | ICD-10-CM

## 2020-03-02 DIAGNOSIS — R627 Adult failure to thrive: Secondary | ICD-10-CM

## 2020-03-02 DIAGNOSIS — Z515 Encounter for palliative care: Principal | ICD-10-CM

## 2020-03-02 DIAGNOSIS — G910 Communicating hydrocephalus: Secondary | ICD-10-CM

## 2020-03-02 DIAGNOSIS — D329 Benign neoplasm of meninges, unspecified: Secondary | ICD-10-CM

## 2020-03-02 DIAGNOSIS — R112 Nausea with vomiting, unspecified: Secondary | ICD-10-CM

## 2020-03-02 DIAGNOSIS — E876 Hypokalemia: Secondary | ICD-10-CM | POA: Diagnosis not present

## 2020-03-02 DIAGNOSIS — R197 Diarrhea, unspecified: Secondary | ICD-10-CM

## 2020-03-02 MED ORDER — BIOTENE DRY MOUTH MT LIQD
15.0000 mL | OROMUCOSAL | Status: AC | PRN
Start: 2020-03-02 — End: ?

## 2020-03-02 MED ORDER — POLYVINYL ALCOHOL 1.4 % OP SOLN
1.0000 [drp] | Freq: Four times a day (QID) | OPHTHALMIC | Status: AC | PRN
Start: 2020-03-02 — End: ?

## 2020-03-02 MED ORDER — HALOPERIDOL 0.5 MG PO TABS: 0.5000 mg | ORAL_TABLET | ORAL | Status: AC | PRN

## 2020-03-02 MED ORDER — LORAZEPAM 1 MG PO TABS
1.0000 mg | ORAL_TABLET | ORAL | Status: AC | PRN
Start: 2020-03-02 — End: ?

## 2020-03-02 MED ORDER — MORPHINE SULFATE (CONCENTRATE) 10 MG/0.5ML PO SOLN: 2.6000 mg | ORAL | Status: DC | PRN

## 2020-03-02 MED ORDER — GLYCOPYRROLATE 1 MG PO TABS: 1.0000 mg | ORAL_TABLET | ORAL | Status: AC | PRN

## 2020-03-02 MED ORDER — MORPHINE SULFATE (CONCENTRATE) 10 MG/0.5ML PO SOLN: 2.6000 mg | ORAL | Status: AC | PRN

## 2020-03-02 NOTE — Clinical Social Work Note (Signed)
Per palliative care NP request, referral made to [facility].    Jakhi Dishman, Clydene Pugh, LCSW

## 2020-03-02 NOTE — Consult Note (Signed)
Consultation Note Date: 03/02/2020   Patient Name: Tracie Wiggins  DOB: 1945/12/21  MRN: [id number]  Age / Sex: 75 y.o., female  PCP: Chevis Pretty, [place] Referring Physician: Barton Dubois, MD  Reason for Consultation: Establishing goals of care, Hospice Evaluation and Psychosocial/spiritual support  HPI/Patient Profile: 75 y.o. female  with past medical history of HTN; HLD; meningioma s/p resection with hydrocephalus, shunt; and depression/anxiety admitted on 03/01/2020 with admission for end of life care.   Clinical Assessment and Goals of Care: I have reviewed medical records including EPIC notes, labs and imaging, received report from bedside nursing staff, examined the patient.    Call to daughter, [name] to discuss diagnosis prognosis, [place], EOL wishes, disposition and options.  I introduced Palliative Medicine as specialized medical care for people living with serious illness. It focuses on providing relief from the symptoms and stress of a serious illness.     We discussed a brief life review of the patient.  Ralph Leyden states that her mother has had a hard time the last few years.  She had a brain surgery and had expected to have marked improvement within 2 months.  She instead had continued declines.  She has been a resident of [facility] since the end of August 2021.  Lilly states that she has been in and out of the hospital repeatedly.   As far as functional and nutritional status she was living in [facility], and needed assistance with ADLs.  We discussed her current illness and what it means in the larger context of her on-going co-morbidities.  Natural disease trajectory and expectations at EOL were discussed.  We talked about comfort and dignity at end-of-life, let nature take its course.  We talked about residential hospice and the benefits of specialized medical care for those  nearing end-of-life.  Little he agrees to hospice referral, transfer to residential hospice if accepted.  Choice of provider, [facility], [place].  I attempted to elicit values and goals of care important to the patient.    Advanced directives, concepts specific to code status, artifical feeding and hydration, and rehospitalization were considered and discussed.  Lilly states that Mrs. Romaniello has decided no further surgeries, no tube to feed her, DNR status.   Questions and concerns were addressed.  The family was encouraged to call with questions or concerns.   Conference with attending, bedside nursing staff, transition of care team related to patient condition, needs, symptom management, disposition, hospice referral.   HCPOA    NEXT OF KIN -only child, daughter, [name].    SUMMARY OF RECOMMENDATIONS   Full comfort care Requesting comfort and dignity at end-of-life, residential hospice at [place]:  DNR  Symptom Management:   Comfort care orders adjusted  Palliative Prophylaxis:   Frequent Pain Assessment, Oral Care and Turn Reposition  Additional Recommendations (Limitations, Scope, Preferences):  Full Comfort Care  Psycho-social/Spiritual:   Desire for further Chaplaincy support:no  Additional Recommendations: Caregiving  Support/Resources and  Education on Hospice  Prognosis:   < 2 weeks, based on declining functional status, declining PPS score, frailty, poor by mouth intake  Discharge Planning: Requesting comfort and dignity at end-of-life, residential hospice at [place]      Primary Diagnoses: Present on Admission: . Failure to thrive in adult . Meningioma ([place]) . 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) . Ileus ([place]) . Electrolyte abnormality   I have reviewed the medical record, interviewed the patient and family, and examined the patient. The following aspects are pertinent.  Past Medical  History:  Diagnosis Date  . Allergy   . Anxiety   . Arthritis   . Cataract   . Degenerative disease of nervous system ([place])   . Depression   . GERD (gastroesophageal reflux disease)   . Headache   . Hydrocephalus ([place])   . Hyperlipidemia   . Hypertension   . Osteoporosis   . Overactive bladder   . Shingles    twice   Social History   Socioeconomic History  . Marital status: Widowed    Spouse name: Not on file  . Number of children: 1  . Years of education: 36  . Highest education level: 9th grade  Occupational History  . Occupation: Retired    Fish farm manager: [employer]  Tobacco Use  . Smoking status: Never Smoker  . Smokeless tobacco: Never Used  Vaping Use  . Vaping Use: Never used  Substance and Sexual Activity  . Alcohol use: No  . Drug use: No  . Sexual activity: Not Currently  Other Topics Concern  . Not on file  Social History Narrative  . Not on file   Social Determinants of Health   Financial Resource Strain: Not on file  Food Insecurity: Not on file  Transportation Needs: Not on file  Physical Activity: Not on file  Stress: Not on file  Social Connections: Not on file   Family History  Problem Relation Age of Onset  . COPD Mother   . Stroke Mother   . Heart disease Father   . Heart attack Father   . Heart disease Sister   . Diabetes Sister   . Heart attack Sister   . Stroke Sister   . Healthy Daughter    Scheduled Meds: Continuous Infusions: . morphine Stopped (03/02/20 0939)   PRN Meds:.acetaminophen **OR** acetaminophen, antiseptic oral rinse, glycopyrrolate **OR** glycopyrrolate **OR** glycopyrrolate, haloperidol **OR** [DISCONTINUED] haloperidol **OR** haloperidol lactate, LORazepam **OR** [DISCONTINUED] LORazepam **OR** LORazepam, morphine, ondansetron **OR** ondansetron (ZOFRAN) IV, polyvinyl alcohol Medications Prior to Admission:  Prior to Admission medications   Medication Sig Start Date End Date Taking? Authorizing Provider   acetaminophen (TYLENOL) 325 MG tablet Take 650 mg by mouth in the morning, at noon, and at bedtime.   Yes [provider]  calcium-vitamin D (OSCAL WITH D) 500-200 MG-UNIT tablet Take 1 tablet by mouth 2 (two) times daily.   Yes [provider]  CRANBERRY PO Take 300 mg by mouth in the morning and at bedtime.   Yes [provider]  ELIQUIS 5 MG TABS tablet Take 5 mg by mouth 2 (two) times daily. 01/25/20  Yes [provider]  LACTOBACILLUS PO Take 1 capsule by mouth in the morning and at bedtime.   Yes [provider]  LACTULOSE PO Take 30 mLs by mouth every 12 (twelve) hours as needed (constipation).   Yes [provider]  levETIRAcetam (KEPPRA) 500 MG tablet Take 1 tablet (500 mg total) by mouth 2 (two) times daily. 08/25/19  Yes Viona Gilmore D, NP  LINZESS 145 MCG CAPS capsule Take 145 mcg by mouth daily. 01/25/20  Yes [provider]  mirtazapine (REMERON) 7.5 MG tablet Take 7.5 mg by mouth at bedtime.   Yes [provider]  omeprazole (PRILOSEC) 40 MG capsule Take 1 capsule (40 mg total) by mouth daily. 04/06/19  Yes Martin, Mary-Margaret, FNP  ondansetron (ZOFRAN-ODT) 4 MG disintegrating tablet Take 4 mg by mouth every 6 (six) hours as needed for nausea or vomiting.   Yes [provider]  potassium chloride SA (KLOR-CON) 20 MEQ tablet Take 20 mEq by mouth daily.   Yes [provider]  senna (SENOKOT) 8.6 MG tablet Take 2 tablets by mouth 2 (two) times daily.   Yes [provider]  sertraline (ZOLOFT) 25 MG tablet Take 0.5 tablets (12.5 mg total) by mouth daily as needed (for anxiety). Only takes 0.5 tablet Patient taking differently: Take 12.5 mg by mouth daily as needed (depression). 04/06/19  Yes Hassell Done Mary-Margaret, FNP   Allergies  Allergen Reactions  . Asa [Aspirin] Anaphylaxis, Nausea Only and Rash  . Codeine Anaphylaxis and Rash  . Erythromycin Anaphylaxis  . Penicillins Anaphylaxis  and Hives    Has patient had a PCN reaction causing immediate rash, facial/tongue/throat swelling, SOB or lightheadedness with hypotension: Yes Has patient had a PCN reaction causing severe rash involving mucus membranes or skin necrosis: No Has patient had a PCN reaction that required hospitalization: No Has patient had a PCN reaction occurring within the last 10 years: No If all of the above answers are "NO", then may proceed with Cephalosporin use.   . Niacin And Related Rash   Review of Systems  Unable to perform ROS: Patient unresponsive    Physical Exam Vitals and nursing note reviewed.  Constitutional:      General: She is not in acute distress.    Appearance: She is ill-appearing.  HENT:     Head: Atraumatic.     Mouth/Throat:     Mouth: Mucous membranes are moist.  Cardiovascular:     Rate and Rhythm: Tachycardia present.  Pulmonary:     Effort: Pulmonary effort is normal.     Comments: periods of apnea Abdominal:     Tenderness: There is no guarding.  Skin:    General: Skin is warm and dry.  Neurological:     Comments: Unresponsive     Vital Signs: BP 98/79 (BP Location: Right Arm)   Pulse (!) 112   Temp (!) 97.4 F (36.3 C) (Oral)   Resp 20   Ht 5\' 3"  (1.6 m)   Wt 90.8 kg   SpO2 (!) 87%   BMI 35.46 kg/m  Pain Scale: 0-10   Pain Score: 0-No pain   SpO2: SpO2: (!) 87 % O2 Device:SpO2: (!) 87 % O2 Flow Rate: .   IO: Intake/output summary:   Intake/Output Summary (Last 24 hours) at 03/02/2020 1055 Last data filed at 03/02/2020 3557 Gross per 24 hour  Intake 1265.38 ml  Output --  Net 1265.38 ml    LBM:   Baseline Weight: Weight: 90.8 kg Most recent weight: Weight: 90.8 kg     Palliative Assessment/Data:   Flowsheet Rows   Flowsheet Row Most Recent Value  Intake Tab   Referral Department Hospitalist  Unit at Time of Referral Med/Surg Unit  Palliative Care Primary Diagnosis Cancer  Date Notified 03/01/20  Palliative Care Type New  Palliative care  Reason for referral End of Life Care Assistance  Date of Admission 03/01/20  Date first seen by Palliative Care 03/02/20  # of days Palliative referral response time 1 Day(s)  # of days IP prior to Palliative referral 0  Clinical Assessment   Palliative Performance Scale Score 10%  Pain Max last 24 hours Not able to report  Pain Min Last 24 hours Not able to report  Dyspnea Max Last 24 Hours Not able to report  Dyspnea Min Last 24 hours Not able to report  Psychosocial & Spiritual Assessment   Palliative Care Outcomes       Time In: 0915 Time Out: 1025 Time Total: 70 minutes  Greater than 50%  of this time was spent counseling and coordinating care related to the above assessment and plan.  Signed by: Drue Novel, NP   Please contact Palliative Medicine Team phone at [phone] for questions and concerns.  For individual provider: See Shea Evans

## 2020-03-02 NOTE — Discharge Summary (Signed)
Physician Discharge Summary  Tracie Wiggins UVO:[id number] DOB: 1945/11/06 DOA: 03/01/2020  PCP: Chevis Pretty, FNP  Admit date: 03/01/2020 Discharge date: 03/02/2020  Time spent: 35 minutes  Recommendations for Outpatient Follow-up:  1. Comfort care and symptomatic management.   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  Patient will be discharge to residential hospice ([place]) in order to pursued further symptomatic management and end-of-life care.  Life expectancy anticipated to be less than 4 weeks.  CODE STATUS: DNR/DNI  Diet recommendation: Comfort feeding is able to take by mouth.  Filed Weights   03/01/20 1120  Weight: 90.8 kg    History of present illness:  As per H&P written by Dr. Lorin Mercy on 03/01/2020 Tracie Wiggins is a 75 y.o. female with medical history significant of HTN; HLD; meningioma s/p resection with hydrocephalus, shunt; and depression/anxiety presenting with FTT.  The patient is alert and oriented to person only.  Per chart, she has not been eating or drinking or taking medications at her facility.    Her daughter reports that she had brain tumor removed and did well until she developed hydrocephalus.  Since then, doesn't eat/drink, doesn't walk, doesn't seem to care.  She was expected to recover initially in a month or two and now thought to be a couple of years.  Has dementia now, which was unexpected.  She has had colitis intermittently for 1-2 years.  She has had seizures, last was prior to removal of the meningioma.  Goals would be to return to normal function, but it appears that she won't be able to return from it.  Now can't stand without 2 person assistance.   She does not  want anymore operations, feeding tubes.  Family thinks she is at a place where she just wants to be comfortable.  She feels terrible all the time and is miserable, she is in pain and hurting.  They just don't think it is going to get better.  ED Course:  Decreased PO intake x 2 weeks, n/v/d, abdominal pain.  Has had chronic issues since August.  CT with ileus, rectal impaction? Loose brown stool on exam so maybe functional obstruction.  Started on Protonix/Pepcid.  Also with edema, anasarca of abdominal wall but unremarkable evaluation.  Has hypo K+ and hypo Mag++.  Previously DNR/DNI, still desires.   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 -Actively suffering for ongoing failure to thrive for multiple month now -Throughout this time she has expressly asked to be able to die and her family is ready to let her go. -Patient is DNR/DNI -After further goals of care discussion and advance care directive with palliative care while hospitalized decision has been made for comfort care management and symptomatic management only -Patient will be transferred to hospice house to further achieve this level of care. -Comfortable and hemodynamically stable to be transfer at this time. -No future hospitalization anticipated -Continue symptomatic management and comfort care at [place]. -As needed morphine sulfate, lorazepam  and Robinul initiated.  Procedures: See below for x-ray reports.  Consultations:  Palliative care  Hospice  Discharge Exam: Vitals:   03/02/20 1041 03/02/20 1426  BP: 98/79 91/71  Pulse: (!) 112 (!) 117  Resp:    Temp: (!) 97.4 F (36.3 C) (!) 97.4 F (36.3  C)  SpO2: (!) 87% 95%    General: Afebrile, no following commands, not responsive.  Overall stable vital signs. Cardiovascular: S1 and S2 appreciated on exam; no rubs, no gallops, no murmurs. Respiratory: No wheezing or crackles; no using accessory muscles. Abdomen: Soft, positive bowel sounds. Extremities: No cyanosis or clubbing.  Discharge Instructions   Discharge Instructions    Discharge instructions   Complete by: As directed    Comfort care/symptomatic management Continue oral medications while capable to take by mouth/sublingual drugs.   Increase activity slowly   Complete by: As directed      Allergies as of 03/02/2020      Reactions   Asa [aspirin] Anaphylaxis, Nausea Only, Rash   Codeine Anaphylaxis, Rash   Erythromycin Anaphylaxis   Penicillins Anaphylaxis, Hives   Has patient had a PCN reaction causing immediate rash, facial/tongue/throat swelling, SOB or lightheadedness with hypotension: Yes Has patient had a PCN reaction causing severe rash involving mucus membranes or skin necrosis: No Has patient had a PCN reaction that required hospitalization: No Has patient had a PCN reaction occurring within the last 10 years: No If all of the above answers are "NO", then may proceed with Cephalosporin use.   Niacin And Related Rash      Medication List    STOP taking these medications   calcium-vitamin D 500-200 MG-UNIT tablet Commonly known as: OSCAL WITH D   CRANBERRY PO   Eliquis 5 MG Tabs tablet Generic drug: apixaban   LACTOBACILLUS PO   LACTULOSE PO   mirtazapine 7.5 MG tablet Commonly known as: REMERON   omeprazole 40 MG capsule Commonly known as: PRILOSEC   potassium chloride SA 20 MEQ tablet Commonly known as: KLOR-CON   senna 8.6 MG tablet Commonly known as: SENOKOT   sertraline 25 MG tablet Commonly known as: ZOLOFT     TAKE these medications   acetaminophen 325 MG tablet Commonly known as: TYLENOL Take 650 mg by mouth in the  morning, at noon, and at bedtime.   antiseptic oral rinse Liqd Apply 15 mLs topically as needed for dry mouth.   glycopyrrolate 1 MG tablet Commonly known as: ROBINUL Take 1 tablet (1 mg total) by mouth every 4 (four) hours as needed (excessive secretions).   haloperidol 0.5 MG tablet Commonly known as: HALDOL Take 1 tablet (0.5 mg total) by mouth every 4 (four) hours as needed for agitation (or delirium).   levETIRAcetam 500 MG tablet Commonly known as: KEPPRA Take 1 tablet (500 mg total) by mouth 2 (two) times daily.   Linzess 145 MCG Caps capsule Generic drug: linaclotide Take 145 mcg by mouth daily.   LORazepam 1 MG tablet Commonly known as: ATIVAN Take 1 tablet (1 mg total) by mouth every 4 (four) hours as needed for anxiety.   morphine CONCENTRATE 10 MG/0.5ML Soln concentrated solution Take 0.13-0.25 mLs (2.6-5 mg total) by mouth every 2 (two) hours as needed for severe pain or moderate pain (EOL care).   ondansetron 4 MG disintegrating tablet Commonly known as: ZOFRAN-ODT Take 4 mg by mouth every 6 (six) hours as needed for nausea or vomiting.   polyvinyl alcohol 1.4 % ophthalmic solution Commonly known as: LIQUIFILM  TEARS Place 1 drop into both eyes 4 (four) times daily as needed for dry eyes.      Allergies  Allergen Reactions  . Asa [Aspirin] Anaphylaxis, Nausea Only and Rash  . Codeine Anaphylaxis and Rash  . Erythromycin Anaphylaxis  . Penicillins Anaphylaxis and Hives    Has patient had a PCN reaction causing immediate rash, facial/tongue/throat swelling, SOB or lightheadedness with hypotension: Yes Has patient had a PCN reaction causing severe rash involving mucus membranes or skin necrosis: No Has patient had a PCN reaction that required hospitalization: No Has patient had a PCN reaction occurring within the last 10 years: No If all of the above answers are "NO", then may proceed with Cephalosporin use.   . Niacin And Related Rash    The results of  significant diagnostics from this hospitalization (including imaging, microbiology, ancillary and laboratory) are listed below for reference.    Significant Diagnostic Studies: CT ABDOMEN PELVIS W CONTRAST  Result Date: 03/01/2020 CLINICAL DATA:  Nausea, vomiting and abdominal pain. Failure to thrive. EXAM: CT ABDOMEN AND PELVIS WITH CONTRAST TECHNIQUE: Multidetector CT imaging of the abdomen and pelvis was performed using the standard protocol following bolus administration of intravenous contrast. CONTRAST:  159mL OMNIPAQUE IOHEXOL 300 MG/ML  SOLN COMPARISON:  Prior CT of the abdomen and pelvis with contrast on 01/13/2020 FINDINGS: Lower chest: Trace bilateral pleural effusions. Hepatobiliary: No focal liver abnormality is seen. Status post cholecystectomy. No biliary dilatation. Pancreas: Unremarkable. No pancreatic ductal dilatation or surrounding inflammatory changes. Spleen: Stable calcified splenic granulomata. Adrenals/Urinary Tract: Adrenal glands are unremarkable. Kidneys are normal, without renal calculi, focal lesion, or hydronephrosis. Bladder is unremarkable. Stomach/Bowel: There is some further distension of the sigmoid colon by gas and liquefied stool since the prior study. At the level of the rectum, mucosal enhancement is again noted without discrete lesion. Functional or mechanical rectal obstruction is not excluded by imaging and correlation is suggested with rectal exam. Rectal decompression may be of benefit. Gaseous distension does continue into other segments of the colon consistent with ileus. The ascending colon again demonstrates some mucosal prominence without distension and suggestion of some submucosal fat which may relate to chronic inflammation. There is no surrounding stranding in the pericolic fat to suggest significant acute colitis. No free air identified. No evidence of small bowel obstruction. Mild nonspecific thickened appearance of the distal stomach and proximal duodenum  without evidence of overt visible ulceration or perforation. Findings may relate to underlying peptic ulcer disease or inflammation. Vascular/Lymphatic: No significant vascular findings are present. Previously noted filling defect at the level of the right common femoral vein extending into the external iliac vein appears to have resolved consistent with resolved DVT. No enlarged abdominal or pelvic lymph nodes. Reproductive: Status post hysterectomy. Stable cyst of the left adnexal region. Other: Ventriculoperitoneal shunt tubing again is noted entering the peritoneal cavity in the midline upper abdomen with some redundancy in the right anterior abdomen followed by eventual course across the midline to the left paracolic region. No evidence of disruption of shunt tubing or abnormal fluid collections along the course of the peritoneal portions of the catheter. No ascites or evidence of focal abscess. Increased body wall edema and anasarca since the prior study which is especially prominent in the lateral right abdominal wall. Component of cellulitis cannot be excluded. No evidence of subcutaneous soft tissue air or focal soft tissue abscess. Musculoskeletal: No acute or significant osseous findings. IMPRESSION: 1. Increased body wall edema and anasarca since the prior  study which is especially prominent in the lateral right abdominal wall. Component of cellulitis cannot be excluded. No evidence of subcutaneous soft tissue air or focal soft tissue abscess. 2. Mild nonspecific thickened appearance of the distal stomach and proximal duodenum without evidence of overt ulceration or perforation. Findings may relate to underlying peptic ulcer disease or inflammation. 3. There is some further distension of the sigmoid colon by gas and liquefied stool since the prior study. At the level of the rectum, mucosal enhancement is again noted without discrete lesion. Functional or mechanical rectal obstruction is not excluded by  imaging and correlation is suggested with rectal exam. Gaseous distension does continue into other segments of the colon consistent with ileus. Rectal decompression may be of benefit. 4. Stable appearance of mucosal prominence and suggestion of some submucosal fat in the ascending colon which may relate to chronic inflammation. There is no evidence of significant acute colitis. 5. Resolution of previously noted DVT in the right common femoral vein extending into the external iliac vein. 6. Trace bilateral pleural effusions. Electronically Signed   By: Aletta Edouard M.D.   On: 03/01/2020 13:44    Microbiology: Recent Results (from the past 240 hour(s))  Resp Panel by RT-PCR (Flu A&B, Covid) Nasopharyngeal Swab     Status: None   Collection Time: 03/01/20  3:01 PM   Specimen: Nasopharyngeal Swab; Nasopharyngeal(NP) swabs in vial transport medium  Result Value Ref Range Status   SARS Coronavirus 2 by RT PCR NEGATIVE NEGATIVE Final    Comment: (NOTE) SARS-CoV-2 target nucleic acids are NOT DETECTED.  The SARS-CoV-2 RNA is generally detectable in upper respiratory specimens during the acute phase of infection. The lowest concentration of SARS-CoV-2 viral copies this assay can detect is 138 copies/mL. A negative result does not preclude SARS-Cov-2 infection and should not be used as the sole basis for treatment or other patient management decisions. A negative result may occur with  improper specimen collection/handling, submission of specimen other than nasopharyngeal swab, presence of viral mutation(s) within the areas targeted by this assay, and inadequate number of viral copies(<138 copies/mL). A negative result must be combined with clinical observations, patient history, and epidemiological information. The expected result is Negative.  Fact Sheet for Patients:  EntrepreneurPulse.com.au  Fact Sheet for Healthcare Providers:   IncredibleEmployment.be  This test is no t yet approved or cleared by the [place] FDA and  has been authorized for detection and/or diagnosis of SARS-CoV-2 by FDA under an Emergency Use Authorization (EUA). This EUA will remain  in effect (meaning this test can be used) for the duration of the COVID-19 declaration under Section 564(b)(1) of the Act, 21 U.S.C.section 360bbb-3(b)(1), unless the authorization is terminated  or revoked sooner.       Influenza A by PCR NEGATIVE NEGATIVE Final   Influenza B by PCR NEGATIVE NEGATIVE Final    Comment: (NOTE) The Xpert Xpress SARS-CoV-2/FLU/RSV plus assay is intended as an aid in the diagnosis of influenza from Nasopharyngeal swab specimens and should not be used as a sole basis for treatment. Nasal washings and aspirates are unacceptable for Xpert Xpress SARS-CoV-2/FLU/RSV testing.  Fact Sheet for Patients: EntrepreneurPulse.com.au  Fact Sheet for Healthcare Providers: IncredibleEmployment.be  This test is not yet approved or cleared by the [place] FDA and has been authorized for detection and/or diagnosis of SARS-CoV-2 by FDA under an Emergency Use Authorization (EUA). This EUA will remain in effect (meaning this test can be used) for the duration of the COVID-19 declaration  under Section 564(b)(1) of the Act, 21 U.S.C. section 360bbb-3(b)(1), unless the authorization is terminated or revoked.  Performed at [facility], [address], [place], [place] [address]      Labs: Basic Metabolic Panel: Recent Labs  Lab 03/01/20 1130  NA 140  K 2.7*  CL 109  CO2 19*  GLUCOSE 73  BUN 23  CREATININE 0.68  CALCIUM 7.3*  MG 1.4*   Liver Function Tests: Recent Labs  Lab 03/01/20 1130  AST 23  ALT 24  ALKPHOS 99  BILITOT 0.8  PROT 4.5*  ALBUMIN 1.9*   Recent Labs  Lab 03/01/20 1130  LIPASE 18   CBC: Recent Labs  Lab 03/01/20 1130  WBC 12.7*   NEUTROABS 7.4  HGB 14.3  HCT 43.0  MCV 95.1  PLT 372    BNP (last 3 results) Recent Labs    08/17/19 1805  BNP 118.0*    Signed:  Barton Dubois MD.  [facility] 03/02/2020, 4:52 PM

## 2020-03-02 NOTE — Progress Notes (Signed)
Nsg Discharge Note  Admit Date:  03/01/2020 Discharge date: 03/02/2020   Tracie Wiggins to be D/C'd  per MD order.  AVS completed.  Copy for chart, and copy for patient signed, and dated. Patient/caregiver able to verbalize understanding.  Discharge Medication: Allergies as of 03/02/2020      Reactions   Asa [aspirin] Anaphylaxis, Nausea Only, Rash   Codeine Anaphylaxis, Rash   Erythromycin Anaphylaxis   Penicillins Anaphylaxis, Hives   Has patient had a PCN reaction causing immediate rash, facial/tongue/throat swelling, SOB or lightheadedness with hypotension: Yes Has patient had a PCN reaction causing severe rash involving mucus membranes or skin necrosis: No Has patient had a PCN reaction that required hospitalization: No Has patient had a PCN reaction occurring within the last 10 years: No If all of the above answers are "NO", then may proceed with Cephalosporin use.   Niacin And Related Rash      Medication List    STOP taking these medications   calcium-vitamin D 500-200 MG-UNIT tablet Commonly known as: OSCAL WITH D   CRANBERRY PO   Eliquis 5 MG Tabs tablet Generic drug: apixaban   LACTOBACILLUS PO   LACTULOSE PO   mirtazapine 7.5 MG tablet Commonly known as: REMERON   omeprazole 40 MG capsule Commonly known as: PRILOSEC   potassium chloride SA 20 MEQ tablet Commonly known as: KLOR-CON   senna 8.6 MG tablet Commonly known as: SENOKOT   sertraline 25 MG tablet Commonly known as: ZOLOFT     TAKE these medications   acetaminophen 325 MG tablet Commonly known as: TYLENOL Take 650 mg by mouth in the morning, at noon, and at bedtime.   antiseptic oral rinse Liqd Apply 15 mLs topically as needed for dry mouth.   glycopyrrolate 1 MG tablet Commonly known as: ROBINUL Take 1 tablet (1 mg total) by mouth every 4 (four) hours as needed (excessive secretions).   haloperidol 0.5 MG tablet Commonly known as: HALDOL Take 1 tablet (0.5 mg total) by mouth every 4  (four) hours as needed for agitation (or delirium).   levETIRAcetam 500 MG tablet Commonly known as: KEPPRA Take 1 tablet (500 mg total) by mouth 2 (two) times daily.   Linzess 145 MCG Caps capsule Generic drug: linaclotide Take 145 mcg by mouth daily.   LORazepam 1 MG tablet Commonly known as: ATIVAN Take 1 tablet (1 mg total) by mouth every 4 (four) hours as needed for anxiety.   morphine CONCENTRATE 10 MG/0.5ML Soln concentrated solution Take 0.13-0.25 mLs (2.6-5 mg total) by mouth every 2 (two) hours as needed for severe pain or moderate pain (EOL care).   ondansetron 4 MG disintegrating tablet Commonly known as: ZOFRAN-ODT Take 4 mg by mouth every 6 (six) hours as needed for nausea or vomiting.   polyvinyl alcohol 1.4 % ophthalmic solution Commonly known as: LIQUIFILM TEARS Place 1 drop into both eyes 4 (four) times daily as needed for dry eyes.       Discharge Assessment: Vitals:   03/02/20 1041 03/02/20 1426  BP: 98/79 91/71  Pulse: (!) 112 (!) 117  Resp:    Temp: (!) 97.4 F (36.3 C) (!) 97.4 F (36.3 C)  SpO2: (!) 87% 95%   Skin clean, dry and intact without evidence of skin break down, no evidence of skin tears noted. IV catheter discontinued intact. Site without signs and symptoms of complications - no redness or edema noted at insertion site, patient denies c/o pain - only slight tenderness at site.  Dressing with slight pressure applied.  D/c Instructions-Education: Discharge instructions given to patient/family with verbalized understanding. D/c education completed with patient/family including follow up instructions, medication list, d/c activities limitations if indicated, with other d/c instructions as indicated by MD - patient able to verbalize understanding, all questions fully answered. Patient instructed to return to ED, call 911, or call MD for any changes in condition.  Patient escorted via [place], and D/C home via private auto.  Dorcas Mcmurray,  LPN 0/01/5613 3:79 PM

## 2020-03-02 NOTE — Progress Notes (Signed)
Received referral from Nursing that patient was at EOL. Found [name] non-responsive and laying in hospital bed with no family present bedside. Patient appeared to be comfortable and without signs of pain. Chaplain sat bedside and provided spiritual presence and quiet prayer. Unable to reach daughter Ralph Leyden at a later time. Chaplain will remain available in order to provide spiritual support and to assess for spiritual need.

## 2020-03-02 NOTE — Progress Notes (Signed)
Report called and given to [facility] RN. EMS to transport patient to awaiting facility.Family at bedside.

## 2020-03-02 NOTE — Progress Notes (Signed)
When I came in to turn the patient she was less responsive with shallow respirations. Dr. made aware. New order for reduced rate of Morphine drip.

## 2020-04-01 DEATH — deceased

## 2020-05-16 ENCOUNTER — Ambulatory Visit (INDEPENDENT_AMBULATORY_CARE_PROVIDER_SITE_OTHER): Payer: Medicare HMO | Admitting: Gastroenterology

## 2021-04-23 IMAGING — CT CT HEAD W/O CM
4 series · 17 of 47 positions shown, 19 images · non-contrast
Comparison: Brain MRI 08/18/2019

CLINICAL DATA: Brain/CNS neoplasm, assess treatment response

EXAM:
CT HEAD WITHOUT CONTRAST
TECHNIQUE: Contiguous axial images were obtained from the base of the skull
through the vertex without intravenous contrast.

[Series 3: head wo · axial · 0.43mm/px · z∈[+1334,+1454]mm · 7 of 33 slices shown, 9 images]
[im 5/33  brain]
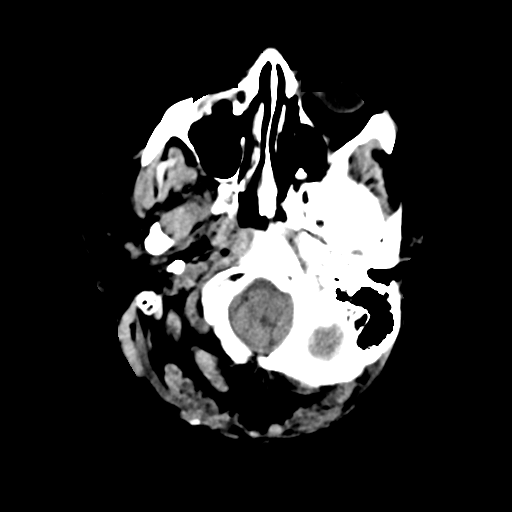
[im 5/33  bone]
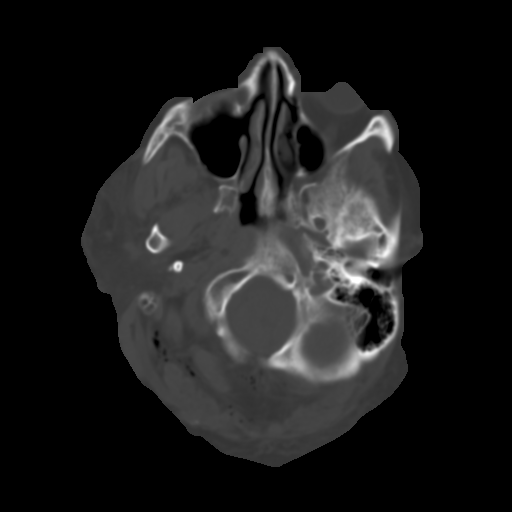
[im 9/33  brain]
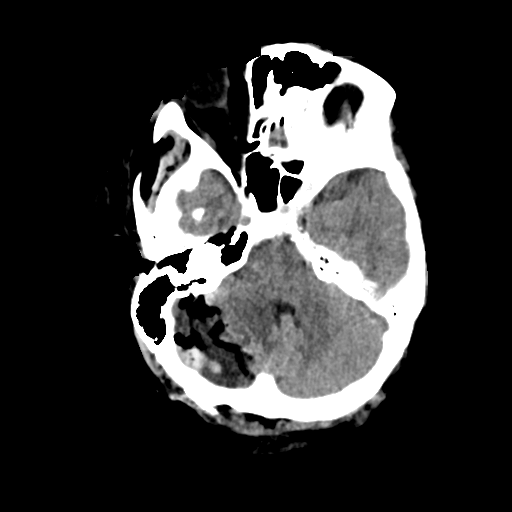
[im 13/33  brain]
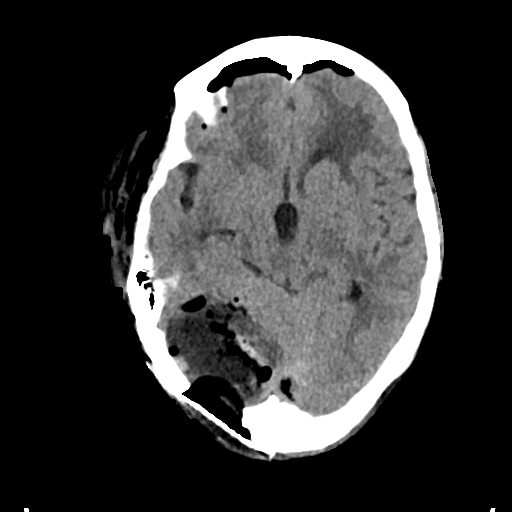
[im 17/33  brain]
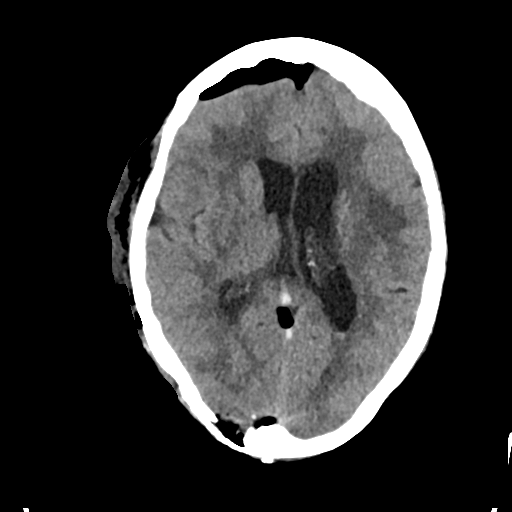
[im 21/33  brain]
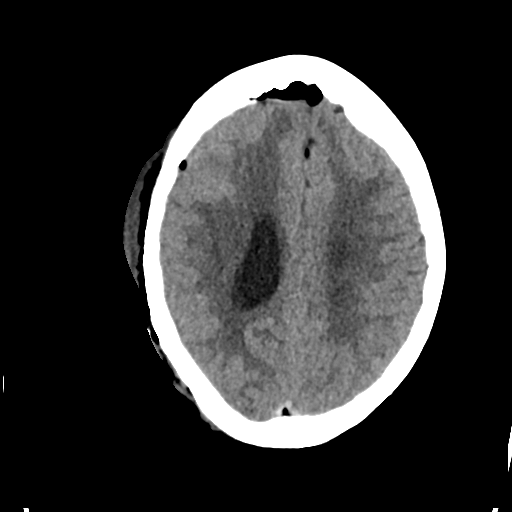
[im 21/33  bone]
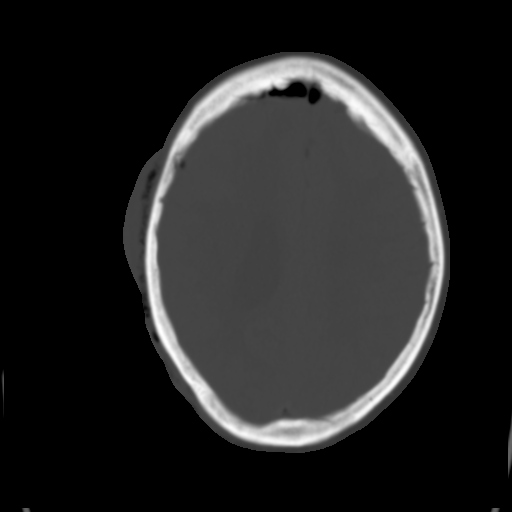
[im 25/33  brain]
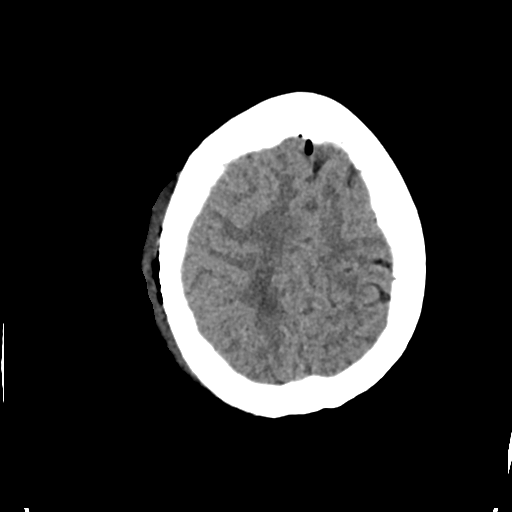
[im 29/33  brain]
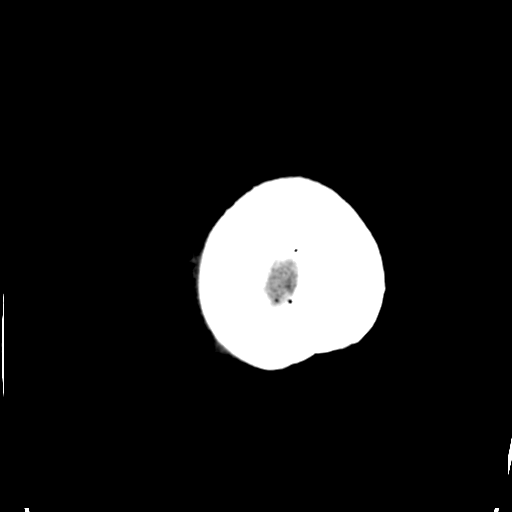

[Series 4: head bone · axial · 0.43mm/px · z∈[+1330,+1386]mm · 4 of 81 slices shown]
[im 9/81  bone]
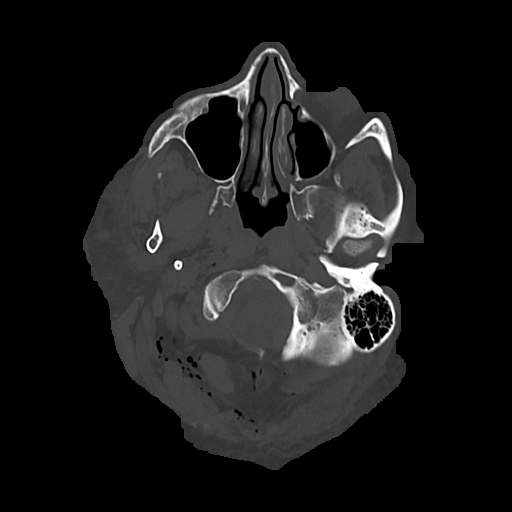
[im 17/81  bone]
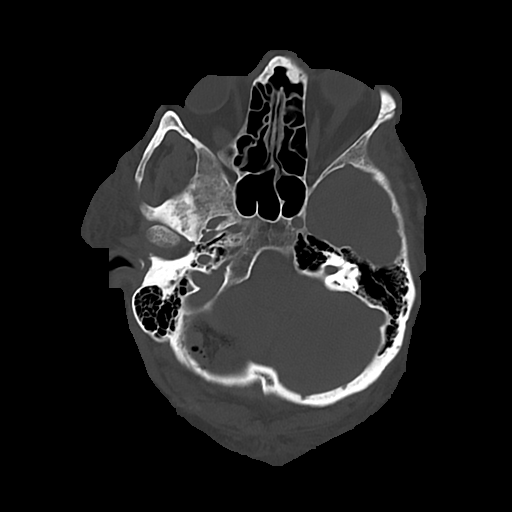
[im 25/81  bone]
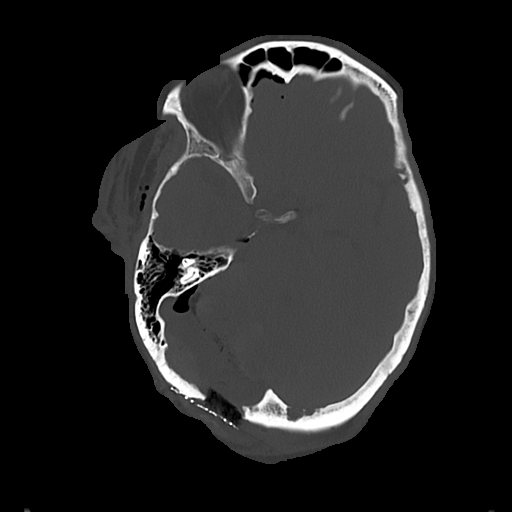
[im 37/81  bone]
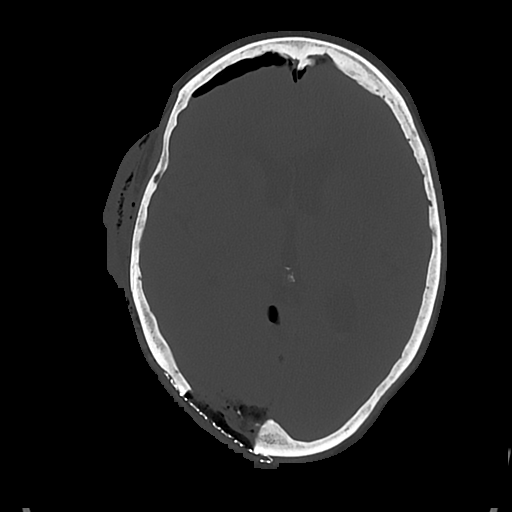

[Series 5: cor soft · coronal · 0.32mm/px · 3 of 70 slices shown]
[im 24/70  brain]
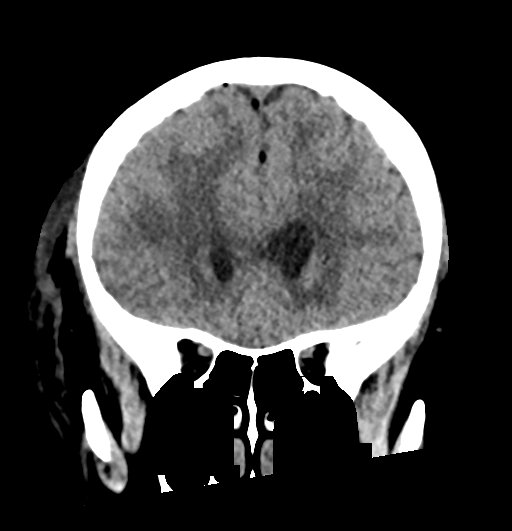
[im 31/70  brain]
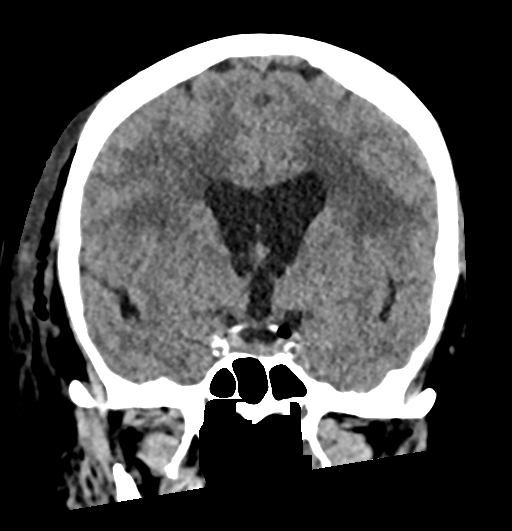
[im 39/70  brain]
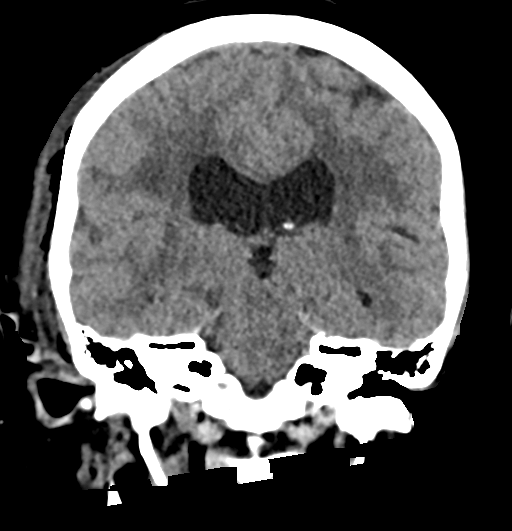

[Series 6: sag soft · sagittal · 0.34mm/px · 3 of 55 slices shown]
[im 21/55  brain]
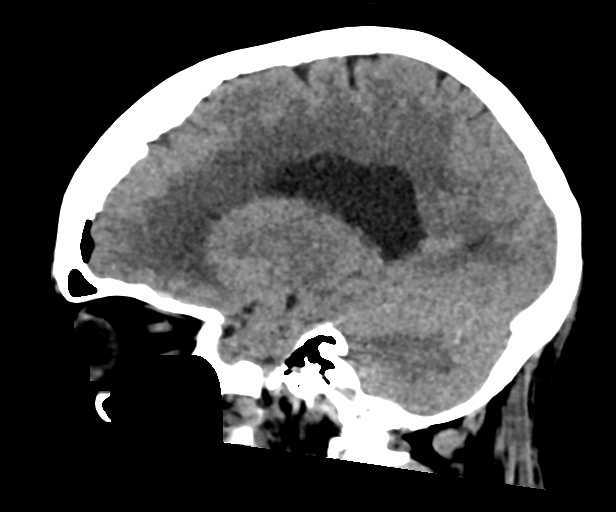
[im 28/55  brain]
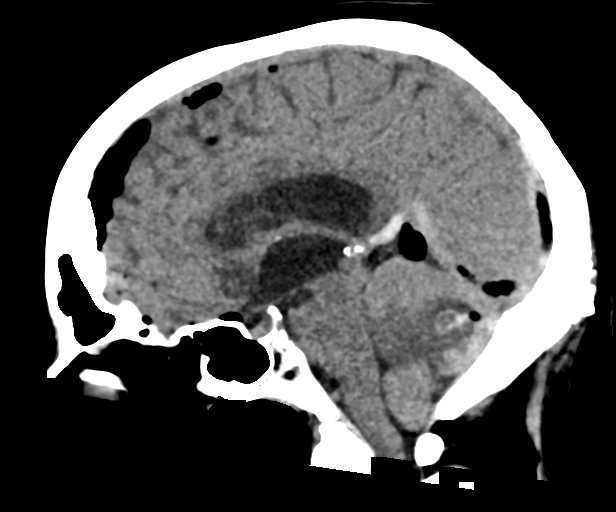
[im 34/55  brain]
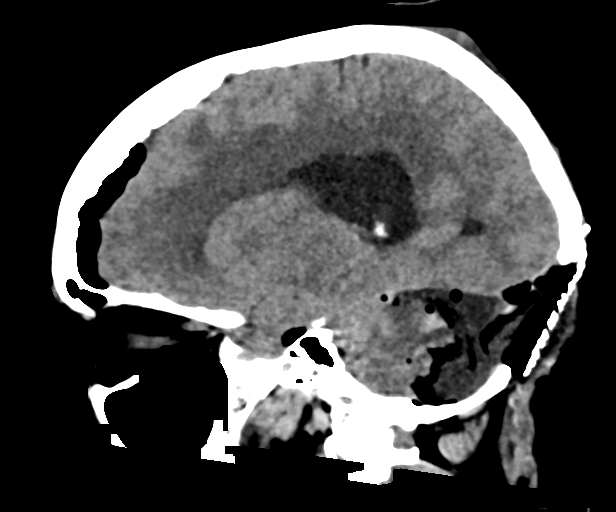

[17 of 47 positions shown; findings below may reference images not displayed]

FINDINGS: Brain: Status post resection of right posterior cranial fossa
meningioma. There is fluid and a small amount of blood within the
resection bed. Mild mass effect on the fourth ventricle. There is
moderate volume pneumocephalus without tension. The right ambient
cistern is effaced. Hydrocephalus is improved. Diffuse
hypoattenuation of the white matter is unchanged.

Vascular: No abnormal hyperdensity of the major intracranial
arteries or dural venous sinuses. No intracranial atherosclerosis.

Skull: Right posterior craniectomy with cranioplasty mesh. Soft
tissue swelling of the right parietal scalp.

Sinuses/Orbits: No fluid levels or advanced mucosal thickening of
the visualized paranasal sinuses. No mastoid or middle ear effusion.
The orbits are normal.
IMPRESSION: 1. Status post resection of right posterior cranial fossa
meningioma. Fluid and a small amount of blood within the resection
bed.
2. Moderate volume pneumocephalus without tension effects.
3. Improved hydrocephalus.

## 2021-11-03 IMAGING — CT CT ABD-PELV W/ CM
2 of 5 series · 14 of 46 positions shown, 16 images · IV contrast (Omnipaque or Isovue)
Comparison: Prior CT of the abdomen and pelvis with contrast on
01/13/2020

CLINICAL DATA: Nausea, vomiting and abdominal pain. Failure to
thrive.

EXAM:
CT ABDOMEN AND PELVIS WITH CONTRAST
TECHNIQUE: Multidetector CT imaging of the abdomen and pelvis was performed
using the standard protocol following bolus administration of
intravenous contrast.
CONTRAST:  100mL OMNIPAQUE IOHEXOL 300 MG/ML  SOLN

[Series 2: axial st · axial · 0.82mm/px · z∈[+658,+1113]mm · 11 of 103 slices shown, 13 images]
[im 6/103  soft-tissue]
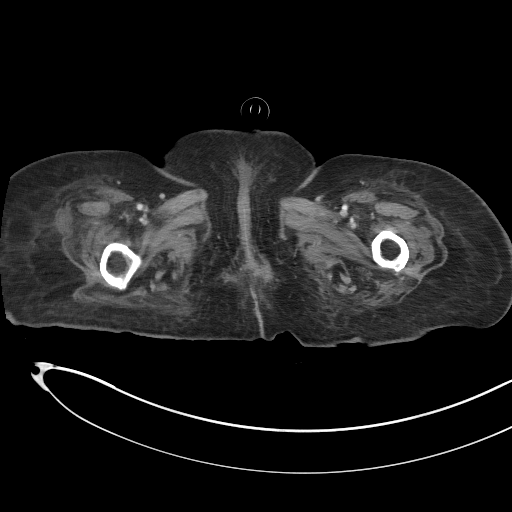
[im 6/103  bone]
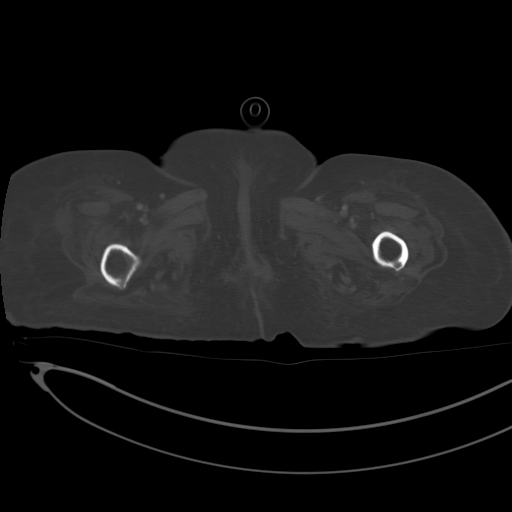
[im 16/103  soft-tissue]
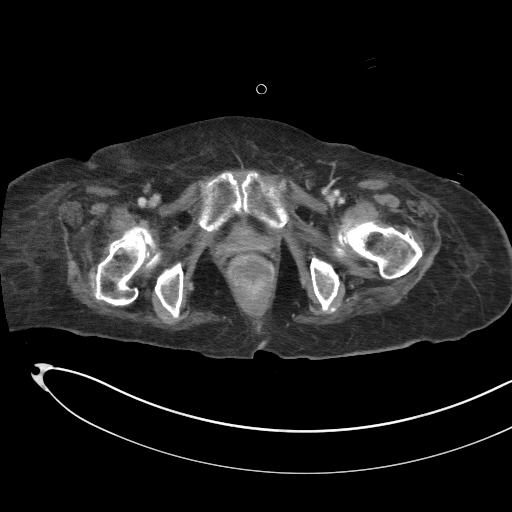
[im 26/103  soft-tissue]
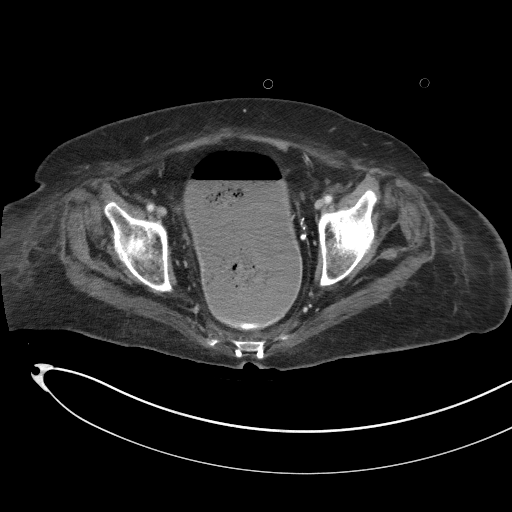
[im 36/103  soft-tissue]
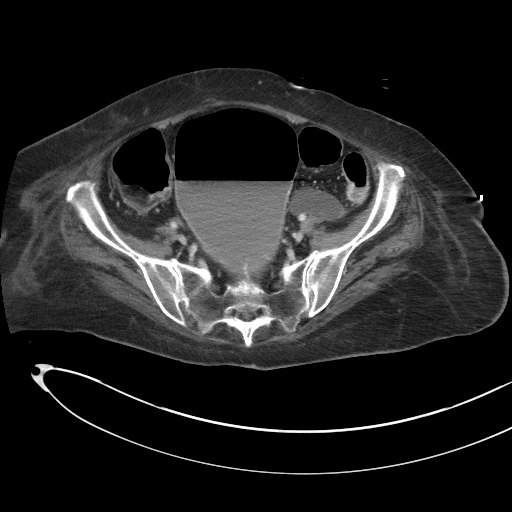
[im 41/103  soft-tissue]
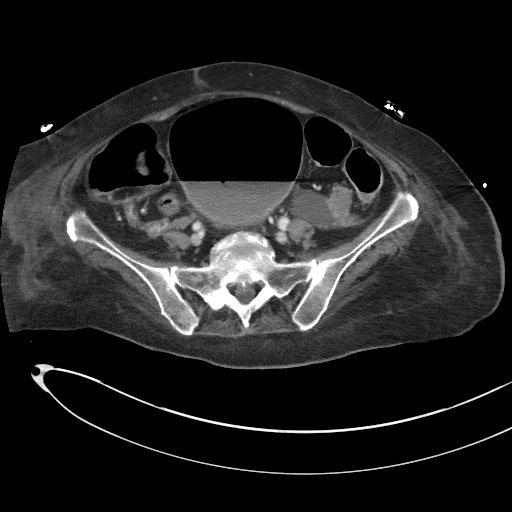
[im 52/103  soft-tissue]
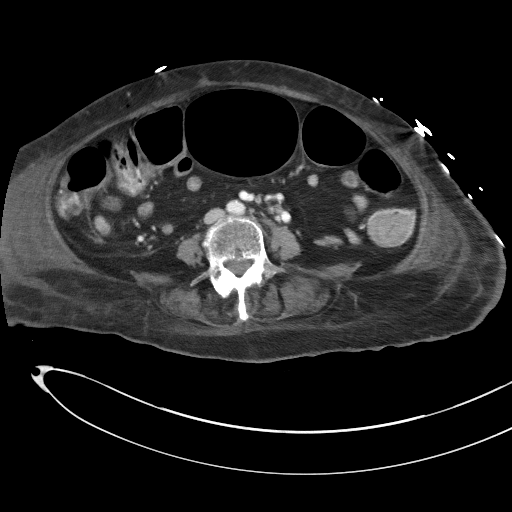
[im 62/103  soft-tissue]
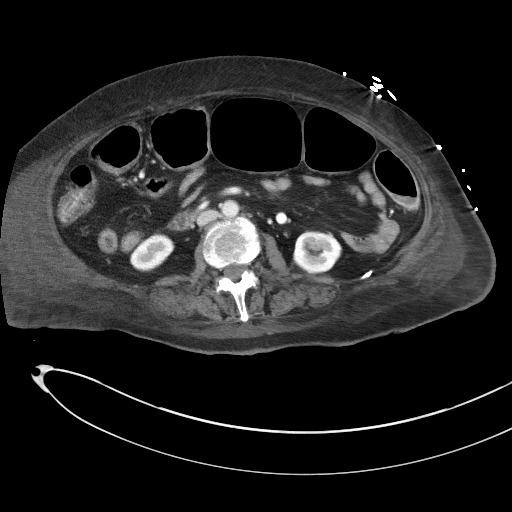
[im 67/103  soft-tissue]
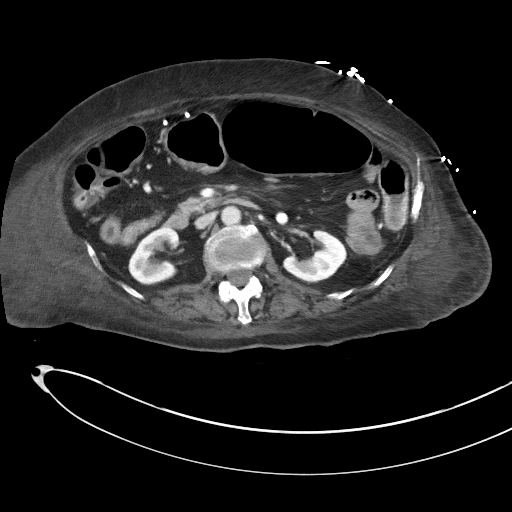
[im 77/103  soft-tissue]
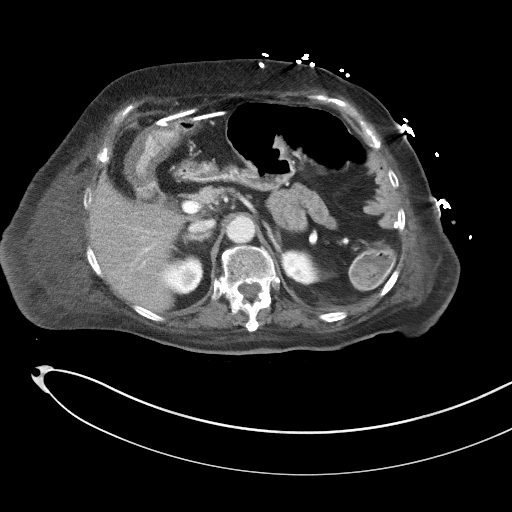
[im 77/103  bone]
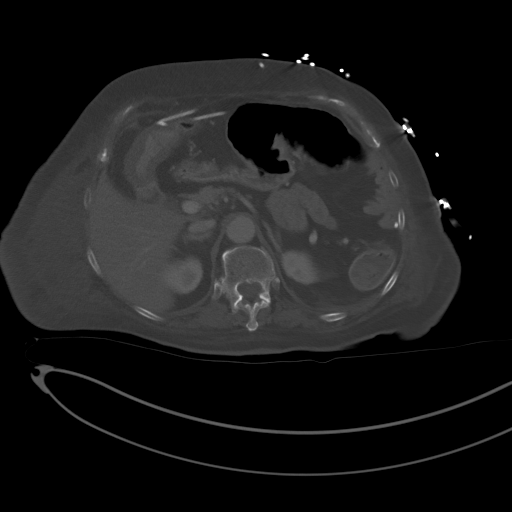
[im 87/103  soft-tissue]
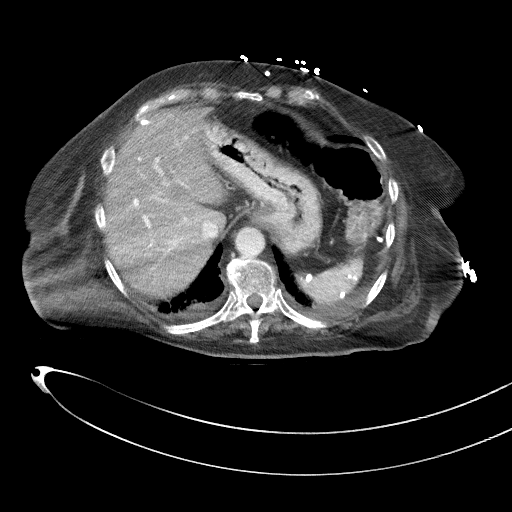
[im 97/103  soft-tissue]
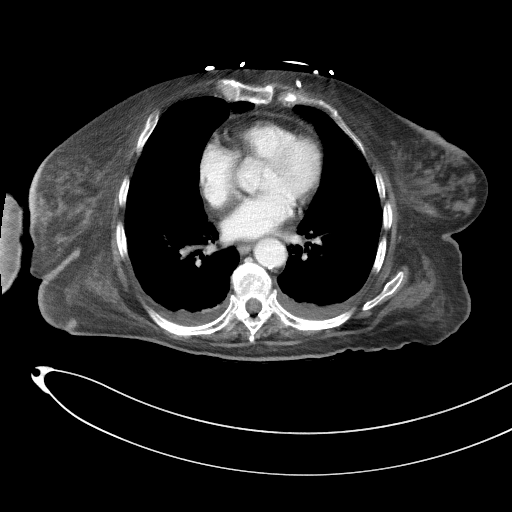

[Series 5: coronal st · coronal · 0.89mm/px · 3 of 98 slices shown]
[im 33/98  soft-tissue]
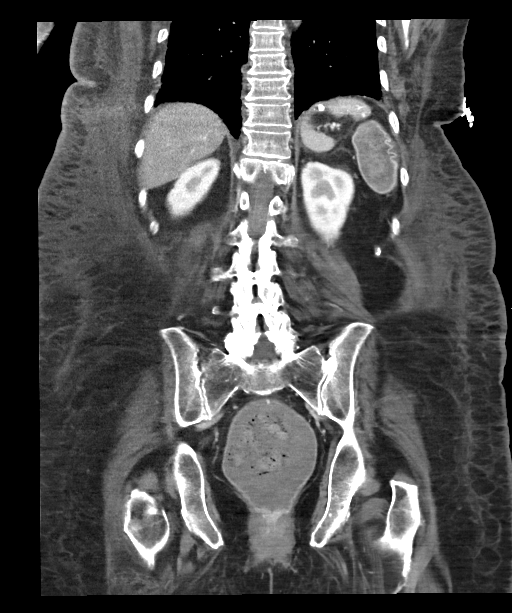
[im 44/98  soft-tissue]
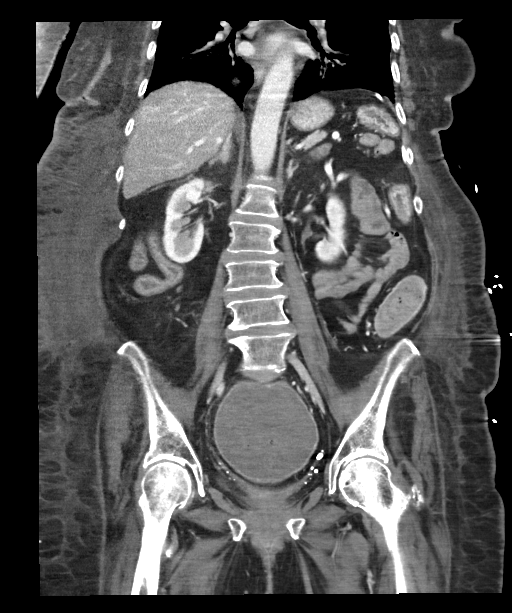
[im 54/98  soft-tissue]
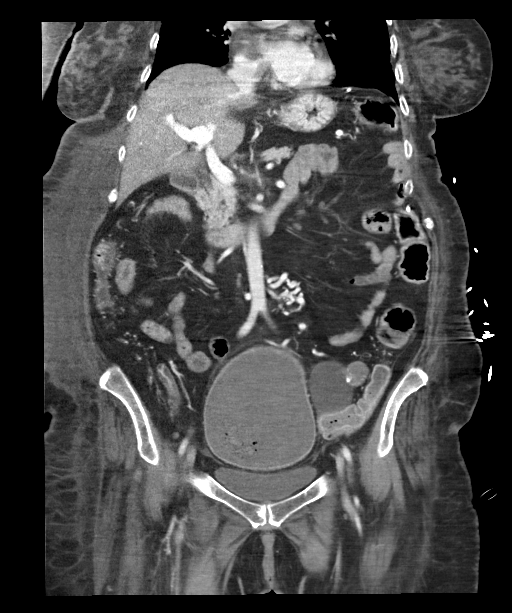

[14 of 46 positions shown; findings below may reference images not displayed]

FINDINGS: Lower chest: Trace bilateral pleural effusions.

Hepatobiliary: No focal liver abnormality is seen. Status post
cholecystectomy. No biliary dilatation.

Pancreas: Unremarkable. No pancreatic ductal dilatation or
surrounding inflammatory changes.

Spleen: Stable calcified splenic granulomata.

Adrenals/Urinary Tract: Adrenal glands are unremarkable. Kidneys are
normal, without renal calculi, focal lesion, or hydronephrosis.
Bladder is unremarkable.

Stomach/Bowel: There is some further distension of the sigmoid colon
by gas and liquefied stool since the prior study. At the level of
the rectum, mucosal enhancement is again noted without discrete
lesion. Functional or mechanical rectal obstruction is not excluded
by imaging and correlation is suggested with rectal exam. Rectal
decompression may be of benefit. Gaseous distension does continue
into other segments of the colon consistent with ileus. The
ascending colon again demonstrates some mucosal prominence without
distension and suggestion of some submucosal fat which may relate to
chronic inflammation. There is no surrounding stranding in the
pericolic fat to suggest significant acute colitis. No free air
identified. No evidence of small bowel obstruction. Mild nonspecific
thickened appearance of the distal stomach and proximal duodenum
without evidence of overt visible ulceration or perforation.
Findings may relate to underlying peptic ulcer disease or
inflammation.

Vascular/Lymphatic: No significant vascular findings are present.
Previously noted filling defect at the level of the right common
femoral vein extending into the external iliac vein appears to have
resolved consistent with resolved DVT. No enlarged abdominal or
pelvic lymph nodes.

Reproductive: Status post hysterectomy. Stable cyst of the left
adnexal region.

Other: Ventriculoperitoneal shunt tubing again is noted entering the
peritoneal cavity in the midline upper abdomen with some redundancy
in the right anterior abdomen followed by eventual course across the
midline to the left paracolic region. No evidence of disruption of
shunt tubing or abnormal fluid collections along the course of the
peritoneal portions of the catheter. No ascites or evidence of focal
abscess.

Increased body wall edema and anasarca since the prior study which
is especially prominent in the lateral right abdominal wall.
Component of cellulitis cannot be excluded. No evidence of
subcutaneous soft tissue air or focal soft tissue abscess.

Musculoskeletal: No acute or significant osseous findings.
IMPRESSION: 1. Increased body wall edema and anasarca since the prior study
which is especially prominent in the lateral right abdominal wall.
Component of cellulitis cannot be excluded. No evidence of
subcutaneous soft tissue air or focal soft tissue abscess.
2. Mild nonspecific thickened appearance of the distal stomach and
proximal duodenum without evidence of overt ulceration or
perforation. Findings may relate to underlying peptic ulcer disease
or inflammation.
3. There is some further distension of the sigmoid colon by gas and
liquefied stool since the prior study. At the level of the rectum,
mucosal enhancement is again noted without discrete lesion.
Functional or mechanical rectal obstruction is not excluded by
imaging and correlation is suggested with rectal exam. Gaseous
distension does continue into other segments of the colon consistent
with ileus. Rectal decompression may be of benefit.
4. Stable appearance of mucosal prominence and suggestion of some
submucosal fat in the ascending colon which may relate to chronic
inflammation. There is no evidence of significant acute colitis.
5. Resolution of previously noted DVT in the right common femoral
vein extending into the external iliac vein.
6. Trace bilateral pleural effusions.
